# Patient Record
Sex: Male | Born: 1935 | Race: Black or African American | Hispanic: No | Smoking: Never smoker
Health system: Southern US, Community
[De-identification: ages and names within clinical notes are randomized; demographics above are authoritative.]

## PROBLEM LIST (undated history)

## (undated) DIAGNOSIS — Z8719 Personal history of other diseases of the digestive system: Secondary | ICD-10-CM

## (undated) DIAGNOSIS — K635 Polyp of colon: Secondary | ICD-10-CM

## (undated) DIAGNOSIS — I2510 Atherosclerotic heart disease of native coronary artery without angina pectoris: Secondary | ICD-10-CM

## (undated) DIAGNOSIS — D649 Anemia, unspecified: Secondary | ICD-10-CM

## (undated) DIAGNOSIS — R319 Hematuria, unspecified: Secondary | ICD-10-CM

## (undated) DIAGNOSIS — I10 Essential (primary) hypertension: Secondary | ICD-10-CM

## (undated) HISTORY — PX: JOINT REPLACEMENT: SHX530

## (undated) HISTORY — PX: COLONOSCOPY WITH ESOPHAGOGASTRODUODENOSCOPY (EGD): SHX5779

---

## 2007-05-01 ENCOUNTER — Ambulatory Visit: Payer: Self-pay | Admitting: Unknown Physician Specialty

## 2012-09-20 ENCOUNTER — Emergency Department: Payer: Self-pay | Admitting: Emergency Medicine

## 2012-11-27 ENCOUNTER — Ambulatory Visit: Payer: Self-pay | Admitting: General Practice

## 2012-12-06 ENCOUNTER — Ambulatory Visit: Payer: Self-pay | Admitting: General Practice

## 2012-12-06 LAB — URINALYSIS, COMPLETE
Blood: NEGATIVE
Ketone: NEGATIVE
Leukocyte Esterase: NEGATIVE
Ph: 6 (ref 4.5–8.0)
Protein: NEGATIVE
RBC,UR: 2 /HPF (ref 0–5)
Specific Gravity: 1.013 (ref 1.003–1.030)

## 2012-12-06 LAB — BASIC METABOLIC PANEL
BUN: 34 mg/dL — ABNORMAL HIGH (ref 7–18)
Calcium, Total: 10 mg/dL (ref 8.5–10.1)
Chloride: 104 mmol/L (ref 98–107)
Creatinine: 1.19 mg/dL (ref 0.60–1.30)
EGFR (Non-African Amer.): 59 — ABNORMAL LOW
Glucose: 101 mg/dL — ABNORMAL HIGH (ref 65–99)
Sodium: 139 mmol/L (ref 136–145)

## 2012-12-06 LAB — CBC
HCT: 40 % (ref 40.0–52.0)
HGB: 13.4 g/dL (ref 13.0–18.0)
MCV: 84 fL (ref 80–100)
Platelet: 302 10*3/uL (ref 150–440)
WBC: 6.5 10*3/uL (ref 3.8–10.6)

## 2012-12-06 LAB — MRSA PCR SCREENING

## 2012-12-06 LAB — SEDIMENTATION RATE: Erythrocyte Sed Rate: 28 mm/hr — ABNORMAL HIGH (ref 0–20)

## 2012-12-06 LAB — PROTIME-INR: INR: 1

## 2012-12-06 LAB — APTT: Activated PTT: 30.9 secs (ref 23.6–35.9)

## 2012-12-07 LAB — URINE CULTURE

## 2012-12-18 ENCOUNTER — Ambulatory Visit: Payer: Self-pay

## 2012-12-20 ENCOUNTER — Inpatient Hospital Stay: Payer: Self-pay | Admitting: General Practice

## 2012-12-21 LAB — BASIC METABOLIC PANEL
Anion Gap: 5 — ABNORMAL LOW (ref 7–16)
Calcium, Total: 8.3 mg/dL — ABNORMAL LOW (ref 8.5–10.1)
Co2: 27 mmol/L (ref 21–32)
EGFR (African American): >60
EGFR (Non-African Amer.): >60
Glucose: 120 mg/dL — ABNORMAL HIGH (ref 65–99)
Osmolality: 279 (ref 275–301)
Potassium: 3.1 mmol/L — ABNORMAL LOW (ref 3.5–5.1)
Sodium: 140 mmol/L (ref 136–145)

## 2012-12-21 LAB — PLATELET COUNT: Platelet: 211 10*3/uL (ref 150–440)

## 2012-12-21 LAB — HEMOGLOBIN: HGB: 9.7 g/dL — ABNORMAL LOW (ref 13.0–18.0)

## 2012-12-22 LAB — BASIC METABOLIC PANEL
Anion Gap: 7 (ref 7–16)
Co2: 25 mmol/L (ref 21–32)
EGFR (African American): >60
EGFR (Non-African Amer.): >60
Glucose: 117 mg/dL — ABNORMAL HIGH (ref 65–99)
Potassium: 3 mmol/L — ABNORMAL LOW (ref 3.5–5.1)

## 2012-12-23 LAB — BASIC METABOLIC PANEL
Anion Gap: 4 — ABNORMAL LOW (ref 7–16)
BUN: 7 mg/dL (ref 7–18)
Calcium, Total: 8.4 mg/dL — ABNORMAL LOW (ref 8.5–10.1)
Co2: 29 mmol/L (ref 21–32)
EGFR (African American): >60
EGFR (Non-African Amer.): >60
Osmolality: 278 (ref 275–301)
Potassium: 3.6 mmol/L (ref 3.5–5.1)
Sodium: 140 mmol/L (ref 136–145)

## 2012-12-23 LAB — PLATELET COUNT: Platelet: 174 10*3/uL (ref 150–440)

## 2012-12-23 LAB — HEMOGLOBIN: HGB: 7.5 g/dL — ABNORMAL LOW (ref 13.0–18.0)

## 2014-06-21 NOTE — Discharge Summary (Signed)
PATIENT NAME:  Kevin [name], Kevin [name] MR#:  [id number] DATE OF BIRTH:  [birth year]  DATE OF ADMISSION:  12/20/2012 DATE OF DISCHARGE:  12/23/2012   ADMITTING DIAGNOSIS: Degenerative arthrosis of the right hip.   DISCHARGE DIAGNOSIS: Degenerative arthrosis of the right hip.    OPERATION: On 12/20/2012, the patient had a right total hip arthroplasty.   SURGEON: Illene LabradorJames P. Angie FavaHooten Jr., MD  ASSISTANT: Van ClinesJon Wolfe, PA   ANESTHESIA: Spinal and general.   ESTIMATED BLOOD LOSS: 300 mL.   DRAINS: Two medium drains to Hemovac reservoir.   IMPLANTS USED: A DePuy 18 mm small stature AML femoral stem, a 66 mm outer diameter Pinnacle Gription Sector acetabular component, +4 mm 10 degree Pinnacle Marathon polyethylene liner, a 36 mm M-Spec femoral head with +5 mm neck length.   The patient was stabilized, brought to the recovery room and then brought down to the orthopedic floor.   HISTORY: The patient is a 81 year old male who presented for evaluation of right hip. The patient has had significant progressive pain involving the right hip with being refractory to conservative treatment.   PHYSICAL EXAMINATION:  GENERAL: An alert male in no acute distress, with an antalgic gait and an abductor lurch.  LUNGS: Clear to auscultation.  CARDIOVASCULAR: Regular rate and rhythm. No murmur.  MUSCULOSKELETAL: In regard to the right hip, the patient has no effusion or swelling. The patient does have tenderness along the lateral aspect of the hip and the anterior hip joint. The patient has discomfort with axial compression test. The patient has full extension and 80 degrees of flexion with 10 degrees adduction and abduction. The patient has 0 degrees internal rotation and 30 degrees external rotation. The patient was stabilized after surgery and brought to the recovery room.   HOSPITAL COURSE: After initial admission on 12/20/2012, the patient was brought to the orthopedic floor. The patient had a hemoglobin of 9.7 on  postoperative day 1, which dropped down to 8.0 on postoperative day 2. Labs were still pending on the day of discharge. The patient did 80 feet with physical therapy on postoperative day 1, progressing to 200 feet and did very well. The patient is ready to go home with home health physical therapy.   DISCHARGE INSTRUCTIONS:  1. The patient [facility], with Dr. Ernest PineHooten. 2. The patient will do weight-bear as tolerated on the affected leg with a walker.  3. The patient will elevate the affected leg with 1 to 2 pillows, keeping the foot higher and do heels off the bed.  4. The patient will use knee-high TED hose on both legs, removed at bedtime.  5. The patient is encouraged to do cough and deep breathing.  6. The patient will resume regular diet.  7. The patient's dressing will be left intact. Try to keep it dry.  8. The patient will have staples removed by home health physical therapy in 2 weeks.  9. The patient will call the clinic if there is any bright red bleeding, calf pain, bowel or bladder difficulty or fever greater than 101.5.   DISCHARGE MEDICATIONS: To resume home medication and then to add Tylenol 1 tablet every 4 hours as needed for pain or fever greater than 100.4, oxycodone 5 mg 1 tablet q.4 hours as needed for severe pain, tramadol 50 mg 1 tablet q.8 hours as needed for mild pain, Lovenox 40 mg subcutaneous for 14 days, then discontinue and begin aspirin 81 mg once a day.  ____________________________ Shela Commons. Dedra Skeens, [place] jtm:lb D: 12/23/2012 05:59:15 ET T: 12/23/2012 06:16:54 ET JOB#: [id number]  cc: J. Dedra Skeens, [place], <Dictator> J Ellinore Merced [facility] PA ELECTRONICALLY SIGNED 01/04/2013 7:14

## 2014-06-21 NOTE — Op Note (Signed)
PATIENT NAME:  Kevin [name], Kevin [name] MR#:  [id number] DATE OF BIRTH:  [birth year]  DATE OF PROCEDURE:  12/20/2012  PREOPERATIVE DIAGNOSIS: Degenerative arthrosis of the right hip.   POSTOPERATIVE DIAGNOSIS: Degenerative arthrosis of the right hip.   PROCEDURE PERFORMED: Right total hip arthroplasty.   SURGEON: Dr. Francesco SorJames Hooten.   ASSISTANT: Van ClinesJon Wolfe, PA (required to maintain retraction throughout the procedure).   ANESTHESIA: Spinal and general.   ESTIMATED BLOOD LOSS: 300 mL.   FLUIDS REPLACED: 2000 mL of crystalloid.   DRAINS: Two medium drains to Hemovac reservoir.   IMPLANTS UTILIZED: DePuy 18 mm small stature AML femoral stem, a 66 mm outer diameter Pinnacle Gription Sector acetabular component, +4 mm 10 degree Pinnacle Marathon polyethylene liner, and a 36 mm M-Spec femoral head with a +5 mm neck length.   INDICATIONS FOR SURGERY: The patient is a 81 year old male who has been seen for complaints of severe progressive right hip and groin pain as well as decreased range of motion of the hip. 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 After discussion of the risks and benefits of surgical intervention, the patient expressed understanding of the risks, benefits, and agreed with plans for surgical intervention.   PROCEDURE IN DETAIL: The patient was brought to the operating room, and after adequate spinal and general anesthesia was achieved, the patient was placed in a left lateral decubitus position. Axillary roll was placed, and all bony prominences were well padded. The patient's right hip and leg were cleaned and prepped with alcohol and DuraPrep draped in the usual sterile fashion. A "timeout" was performed as per usual protocol. A lateral curvilinear incision was made gently curving towards the posterior superior iliac spine. IT band was incised in  line with the skin incision, and fibers of the gluteus maximus were split in line. The piriformis tendon was identified, skeletonized, and incised at its insertion on the proximal femur and reflected posteriorly. In a similar fashion, short external rotators were incised and reflected posteriorly. A T-type posterior capsulotomy was performed. Measurements were obtained of limb length with the knees flexed to 90 degrees and then the hip was dislocated posteriorly. Severe degenerative changes were noted with full thickness loss of articular cartilage and gross deformity of the femoral head. Prominent osteophytes were noted about the base of the head as well as along the area proximal to the lesser trochanter. Debridement of the osteophytes was performed in sufficient fashion such as to better delineate the true femoral neck. The femoral neck cut was performed using an oscillating saw. The capsule was elevated off of the anterior aspect of the femoral neck using a periosteal elevator. Inspection of the acetabulum also demonstrated full-thickness loss of articular cartilage with prominent osteophytes anteriorly and inferiorly. The acetabulum was reamed in a sequential fashion up to a 65 mm diameter. This allowed for good punctate bleeding bone. A 66 mm outer diameter Pinnacle Gription Sector acetabular component was positioned and impacted into place. Excellent scratch fit was appreciated. A +4 mm polyethylene trial was inserted. The anterior and inferior osteophytes were then debrided using a combination of osteotome and rongeur. Attention was then directed to the proximal femur. Pilot hole for reaming of the proximal femoral canal was created using a high-speed bur. Proximal femoral canal was reamed in a sequential fashion up to a 17.5 mm diameter. This allowed for approximately 4.5 cm of scratch fit.  The proximal femur was then further prepared using an 18 mm aggressive side-biting reamer. Serial broaches were  inserted up to 18 mm small stature broach. The calcar region was planed and trial reduction was performed using first +1.5 and subsequently +5 mm neck length. Reasonably good stability was appreciated, but it was elected to also trial with a +4 mm 10 degree liner with the high side at approximately the eight o'clock position. This allowed for improved posterior stability. Trial components were removed. The acetabular shell was irrigated was normal saline with antibiotic solution and then suctioned dry. A +4 mm 10 degree Pinnacle Marathon polyethylene liner was positioned with the high side at eight o'clock position and impacted into place. Next, an 18 mm small stature AML femoral component was initially positioned. Given the contact area it was elected to carefully ream line to line with an 18 mm reamer by hand. The 18 mm small stature AML femoral component was repositioned and then impacted into place. Excellent scratch fit was appreciated. Trial reduction was again performed with a 36 mm hip ball with a +5 mm neck length. Again, excellent stability was noted both anteriorly and posteriorly. Limb lengths were measured and felt to be consistent with preoperative planning for improvement of equalization of limb lengths. Trial hip ball was removed.The Morse taper  was cleaned and dried. A 36 mm outer diameter M-Spec femoral head with a +5 mm neck length was placed on the trunnion and impacted into place. The hip was reduced and placed through range of motion. Again, excellent stability was noted both anteriorly and posteriorly and good equalization of limb lengths was appreciated. The wound was irrigated with copious amounts of normal saline with antibiotic solution using pulsatile lavage and then suctioned dry. The posterior capsulotomy was repaired using #5 Ethibond. The piriformis tendon was reapproximated on the undersurface of the gluteus medius tendon using #5 Ethibond. Two medium drains were placed in the wound  bed and brought out through a separate stab incision to be attached to a Hemovac reservoir. IT band was repaired using interrupted sutures of #1 Vicryl. The subcutaneous tissue was approximated in layers using first #0 Vicryl followed by 2-0 Vicryl. Skin was closed with skin staples. Sterile dressing was applied.   The patient tolerated the procedure well. He was transported to the recovery room in stable condition.    ____________________________ Illene Labrador. Angie Fava., MD jph:sg D: 12/21/2012 06:16:19 ET T: 12/21/2012 06:47:43 ET JOB#: [id number]  cc: Illene Labrador. Angie Fava., MD, <Dictator> Illene Labrador Angie Fava MD ELECTRONICALLY SIGNED 12/22/2012 19:19

## 2014-06-21 NOTE — Op Note (Signed)
PATIENT NAME:  Kevin [name], [name] MR#:  [id number] DATE OF BIRTH:  [birth year]  INTRAOPERATIVE CONSULTATION   DATE OF PROCEDURE:  12/20/2012  REQUESTING PHYSICIAN: Illene LabradorJames P. Angie FavaHooten Jr., MD   CONSULTING PHYSICIAN: Verna CzechScott C. Braedon Sjogren, MD   REASON FOR CONSULTATION: Inability to place Foley catheter.   HISTORY OF PRESENT ILLNESS: A 81 year old male scheduled for a hip replacement. After induction of anesthesia, operating room personnel were unable to place a Foley catheter. There is no previous history of urological or urethral surgery.   DESCRIPTION: The external genitalia were prepped and draped. A Uro-Jet lidocaine gel was instilled per urethra. An 2018 French coude catheter was placed without difficulty and placed to gravity drainage.   IMPRESSION: Benign prostatic hypertrophy.   RECOMMENDATION: Discontinue Foley catheter postoperatively per routine. Reconsult for any voiding difficulties.    ____________________________ Verna CzechScott C. Lonna CobbStoioff, MD scs:lb D: 12/21/2012 08:12:27 ET T: 12/21/2012 08:53:53 ET JOB#: [id number]  cc: Lorin PicketScott C. Lonna CobbStoioff, MD, <Dictator> Riki AltesSCOTT C Natash Berman MD ELECTRONICALLY SIGNED 01/03/2013 8:42

## 2014-09-26 ENCOUNTER — Encounter: Payer: Self-pay | Admitting: *Deleted

## 2014-09-27 ENCOUNTER — Encounter
Admission: RE | Disposition: A | Discharge status: Home / Self Care | Payer: Self-pay | Source: Ambulatory Visit | Attending: Unknown Physician Specialty

## 2014-09-27 ENCOUNTER — Ambulatory Visit: Payer: Medicare Other | Admitting: Anesthesiology

## 2014-09-27 ENCOUNTER — Ambulatory Visit
Admission: RE | Admit: 2014-09-27 | Discharge: 2014-09-27 | Disposition: A | Discharge status: Home / Self Care | Payer: Medicare Other | Source: Ambulatory Visit | Attending: Unknown Physician Specialty | Admitting: Unknown Physician Specialty

## 2014-09-27 DIAGNOSIS — I10 Essential (primary) hypertension: Secondary | ICD-10-CM | POA: Insufficient documentation

## 2014-09-27 DIAGNOSIS — K640 First degree hemorrhoids: Secondary | ICD-10-CM | POA: Diagnosis not present

## 2014-09-27 DIAGNOSIS — Z79899 Other long term (current) drug therapy: Secondary | ICD-10-CM | POA: Diagnosis not present

## 2014-09-27 DIAGNOSIS — I2510 Atherosclerotic heart disease of native coronary artery without angina pectoris: Secondary | ICD-10-CM | POA: Insufficient documentation

## 2014-09-27 DIAGNOSIS — Z9660 Presence of unspecified orthopedic joint implant: Secondary | ICD-10-CM | POA: Diagnosis not present

## 2014-09-27 DIAGNOSIS — K449 Diaphragmatic hernia without obstruction or gangrene: Secondary | ICD-10-CM | POA: Insufficient documentation

## 2014-09-27 DIAGNOSIS — Z09 Encounter for follow-up examination after completed treatment for conditions other than malignant neoplasm: Secondary | ICD-10-CM | POA: Diagnosis not present

## 2014-09-27 DIAGNOSIS — Z7982 Long term (current) use of aspirin: Secondary | ICD-10-CM | POA: Diagnosis not present

## 2014-09-27 DIAGNOSIS — N529 Male erectile dysfunction, unspecified: Secondary | ICD-10-CM | POA: Insufficient documentation

## 2014-09-27 DIAGNOSIS — Z86010 Personal history of colonic polyps: Secondary | ICD-10-CM | POA: Diagnosis present

## 2014-09-27 HISTORY — DX: Essential (primary) hypertension: I10

## 2014-09-27 HISTORY — PX: COLONOSCOPY WITH PROPOFOL: SHX5780

## 2014-09-27 HISTORY — DX: Polyp of colon: K63.5

## 2014-09-27 HISTORY — DX: Personal history of other diseases of the digestive system: Z87.19

## 2014-09-27 HISTORY — DX: Anemia, unspecified: D64.9

## 2014-09-27 HISTORY — DX: Hematuria, unspecified: R31.9

## 2014-09-27 HISTORY — DX: Atherosclerotic heart disease of native coronary artery without angina pectoris: I25.10

## 2014-09-27 SURGERY — COLONOSCOPY WITH PROPOFOL
Anesthesia: General

## 2014-09-27 MED ORDER — PROPOFOL INFUSION 10 MG/ML OPTIME: INTRAVENOUS | Status: DC | PRN

## 2014-09-27 MED ORDER — PROPOFOL INFUSION 10 MG/ML OPTIME
INTRAVENOUS | Status: DC | PRN
  Administered 2014-09-27: 140 ug/kg/min via INTRAVENOUS

## 2014-09-27 MED ORDER — FENTANYL CITRATE (PF) 100 MCG/2ML IJ SOLN
INTRAMUSCULAR | Status: DC | PRN
  Administered 2014-09-27: 50 ug via INTRAVENOUS

## 2014-09-27 MED ORDER — SODIUM CHLORIDE 0.9 % IV SOLN
INTRAVENOUS | Status: DC
  Administered 2014-09-27: 11:00:00 via INTRAVENOUS
  Administered 2014-09-27: 1000 mL via INTRAVENOUS

## 2014-09-27 MED ORDER — PROPOFOL 10 MG/ML IV BOLUS
INTRAVENOUS | Status: DC | PRN
  Administered 2014-09-27: 50 mg via INTRAVENOUS

## 2014-09-27 MED ORDER — MIDAZOLAM HCL 5 MG/5ML IJ SOLN
INTRAMUSCULAR | Status: DC | PRN
  Administered 2014-09-27: 1 mg via INTRAVENOUS

## 2014-09-27 MED ORDER — PIPERACILLIN-TAZOBACTAM 3.375 G IVPB 30 MIN
3.3750 g | Freq: Once | INTRAVENOUS | Status: AC
  Administered 2014-09-27: 3.375 g via INTRAVENOUS
  Filled 2014-09-27: qty 50

## 2014-09-27 NOTE — H&P (Signed)
Primary Care Physician:  Kevin Braun, MD Primary Gastroenterologist:  Dr. Mechele [name]  Pre-Procedure History & Physical: HPI:  Kevin [name] is a 81 y.o. [name] is here for an colonoscopy.   Past Medical History  Diagnosis Date  . Hypertension   . History of hiatal hernia   . Anemia   . Colon [name]   . Coronary artery disease   . Hematuria     Past Surgical History  Procedure Laterality Date  . Joint replacement    . Colonoscopy with esophagogastroduodenoscopy (egd)      Prior to Admission medications   Medication Sig Start Date End Date Taking? Authorizing Provider  amLODipine (NORVASC) 10 MG tablet Take 10 mg by mouth daily.   Yes Historical Provider, MD  aspirin 81 MG tablet Take 81 mg by mouth daily.   Yes Historical Provider, MD  sildenafil (VIAGRA) 100 MG tablet Take 100 mg by mouth daily as needed for erectile dysfunction.   Yes Historical Provider, MD  triamterene-hydrochlorothiazide (MAXZIDE-25) 37.5-25 MG per tablet Take 1 tablet by mouth daily.   Yes Historical Provider, MD    Allergies as of 08/28/2014  . (Not on File)    History reviewed. No pertinent family history.  History   Social History  . Marital Status: Married    Spouse Name: N/A  . Number of Children: N/A  . Years of Education: N/A   Occupational History  . Not on file.   Social History Main Topics  . Smoking status: Never Smoker   . Smokeless tobacco: Not on file  . Alcohol Use: No  . Drug Use: No  . Sexual Activity: Not on file   Other Topics Concern  . Not on file   Social History Narrative    Review of Systems: See HPI, otherwise negative ROS  Physical Exam: BP 154/105 mmHg  Pulse 79  Temp(Src) 97.2 F (36.2 C) (Tympanic)  Resp 18  Ht 6\' 2"  (1.88 m)  Wt 86.183 kg (190 lb)  BMI 24.38 kg/m2  SpO2 100% General:   Alert,  pleasant and cooperative in NAD Head:  Normocephalic and atraumatic. Neck:  Supple; no masses or thyromegaly. Lungs:  Clear throughout to  auscultation.    Heart:  Regular rate and rhythm. Abdomen:  Soft, nontender and nondistended. Normal bowel sounds, without guarding, and without rebound.   Neurologic:  Alert and  oriented x4;  grossly normal neurologically.  Impression/Plan: Kevin [name] is here for an colonoscopy to be performed for Kevin [name]   Risks, benefits, limitations, and alternatives regarding  colonoscopy have been reviewed with the patient.  Questions have been answered.  All parties agreeable.   Kevin Prude, MD  09/27/2014, 10:53 AM   Primary Care Physician:  Kevin Braun, MD Primary Gastroenterologist:  Dr. Mechele [name]  Pre-Procedure History & Physical: HPI:  Kevin [name] is a 81 y.o. [name] is here for an colonoscopy.   Past Medical History  Diagnosis Date  . Hypertension   . History of hiatal hernia   . Anemia   . Colon [name]   . Coronary artery disease   . Hematuria     Past Surgical History  Procedure Laterality Date  . Joint replacement    . Colonoscopy with esophagogastroduodenoscopy (egd)      Prior to Admission medications   Medication Sig Start Date End Date Taking? Authorizing Provider  amLODipine (NORVASC) 10 MG tablet Take 10 mg by mouth daily.   Yes Historical Provider, MD  aspirin  81 MG tablet Take 81 mg by mouth daily.   Yes Historical Provider, MD  sildenafil (VIAGRA) 100 MG tablet Take 100 mg by mouth daily as needed for erectile dysfunction.   Yes Historical Provider, MD  triamterene-hydrochlorothiazide (MAXZIDE-25) 37.5-25 MG per tablet Take 1 tablet by mouth daily.   Yes Historical Provider, MD    Allergies as of 08/28/2014  . (Not on File)    History reviewed. No pertinent family history.  History   Social History  . Marital Status: Married    Spouse Name: N/A  . Number of Children: N/A  . Years of Education: N/A   Occupational History  . Not on file.   Social History Main Topics  . Smoking status: Never Smoker   . Smokeless tobacco: Not on  file  . Alcohol Use: No  . Drug Use: No  . Sexual Activity: Not on file   Other Topics Concern  . Not on file   Social History Narrative    Review of Systems: See HPI, otherwise negative ROS  Physical Exam: BP 154/105 mmHg  Pulse 79  Temp(Src) 97.2 F (36.2 C) (Tympanic)  Resp 18  Ht  (1.88 m)  Wt 86.183 kg (190 lb)  BMI 24.38 kg/m2  SpO2 100% General:   Alert,  pleasant and cooperative in NAD Head:  Normocephalic and atraumatic. Neck:  Supple; no masses or thyromegaly. Lungs:  Clear throughout to auscultation.    Heart:  Regular rate and rhythm. Abdomen:  Soft, nontender and nondistended. Normal bowel sounds, without guarding, and without rebound.   Neurologic:  Alert and  oriented x4;  grossly normal neurologically.  Impression/Plan: Kevin [name] is here for an colonoscopy to be performed for personal hx colon [name]  Risks, benefits, limitations, and alternatives regarding  colonoscopy have been reviewed with the patient.  Questions have been answered.  All parties agreeable.   Kevin Prude, MD  09/27/2014, 10:53 AM

## 2014-09-27 NOTE — Anesthesia Preprocedure Evaluation (Addendum)
Anesthesia Evaluation  Patient identified by MRN, date of birth, ID band Patient awake    Reviewed: Allergy & Precautions, H&P , NPO status , Patient's Chart, lab work & pertinent test results, reviewed documented beta blocker date and time   Airway Mallampati: III  TM Distance: >3 FB Neck ROM: limited    Dental  (+) Upper Dentures, Lower Dentures, Poor Dentition, Chipped, Missing   Pulmonary neg pulmonary ROS,  breath sounds clear to auscultation  Pulmonary exam normal       Cardiovascular hypertension, + CAD negative cardio ROS Normal cardiovascular examRhythm:regular Rate:Normal     Neuro/Psych negative neurological ROS  negative psych ROS   GI/Hepatic negative GI ROS, Neg liver ROS, hiatal hernia,   Endo/Other  negative endocrine ROS  Renal/GU negative Renal ROS  negative genitourinary   Musculoskeletal   Abdominal   Peds  Hematology negative hematology ROS (+)   Anesthesia Other Findings Past Medical History:   Hypertension                                                 History of hiatal hernia                                     Anemia                                                       Colon polyps                                                 Coronary artery disease                                      Hematuria                                            Reproductive/Obstetrics negative OB ROS                           Anesthesia Physical Anesthesia Plan  ASA: III  Anesthesia Plan: General   Post-op Pain Management:    Induction:   Airway Management Planned:   Additional Equipment:   Intra-op Plan:   Post-operative Plan:   Informed Consent: I have reviewed the patients History and Physical, chart, labs and discussed the procedure including the risks, benefits and alternatives for the proposed anesthesia with the patient or authorized representative who has  indicated his/her understanding and acceptance.   Dental Advisory Given  Plan Discussed with: Anesthesiologist, CRNA and Surgeon  Anesthesia Plan Comments:         Anesthesia Quick Evaluation

## 2014-09-27 NOTE — Anesthesia Postprocedure Evaluation (Signed)
  Anesthesia Post-op Note  Patient: Kevin [name]  Procedure(s) Performed: Procedure(s): COLONOSCOPY WITH PROPOFOL (N/A)  Anesthesia type:General  Patient location: PACU  Post pain: Pain level controlled  Post assessment: Post-op Vital signs reviewed, Patient's Cardiovascular Status Stable, Respiratory Function Stable, Patent Airway and No signs of Nausea or vomiting  Post vital signs: Reviewed and stable  Last Vitals:  Filed Vitals:   09/27/14 1200  BP: 130/64  Pulse: 60  Temp:   Resp: 24    Level of consciousness: awake, alert  and patient cooperative  Complications: No apparent anesthesia complications

## 2014-09-27 NOTE — Op Note (Signed)
[facility] Patient Name: Kevin [name] Procedure Date: 09/27/2014 10:52 AM MRN: [id number] Account #: [id number] Date of Birth: November 07, 1933 Admit Type: Outpatient Age: 81 Room: [facility] ENDO ROOM 1 Gender: Male Note Status: Finalized Procedure:         Colonoscopy Indications:       Personal history of colonic polyps Providers:         Scot Jun, MD Referring MD:      Teena Irani. Sampson Goon, MD (Referring MD) Medicines:         Propofol per Anesthesia Complications:     No immediate complications. Procedure:         Pre-Anesthesia Assessment:                    - After reviewing the risks and benefits, the patient was                     deemed in satisfactory condition to undergo the procedure.                    After obtaining informed consent, the colonoscope was                     passed under direct vision. Throughout the procedure, the                     patient's blood pressure, pulse, and oxygen saturations                     were monitored continuously. The Olympus PCF-160AL                     colonoscope (S#. [id number]) was introduced through the anus                     and advanced to the the cecum, identified by appendiceal                     orifice and ileocecal valve. The colonoscopy was performed                     without difficulty. The patient tolerated the procedure                     well. The quality of the bowel preparation was good. Findings:      Internal hemorrhoids were found during endoscopy. The hemorrhoids were       small and Grade I (internal hemorrhoids that do not prolapse).      The exam was otherwise without abnormality. The colon was otherwise       normal, but long and redundant. Impression:        - Internal hemorrhoids.                    - The examination was otherwise normal.                    - No specimens collected. Recommendation:    - The findings and recommendations were discussed with the                     patient's family. No routine repeat needed. Scot Jun, MD 09/27/2014 11:29:15 AM This report has been signed electronically. Number of Addenda: 0 Note Initiated On: 09/27/2014 10:52 AM  Scope Withdrawal Time: 0 hours 10 minutes 32 seconds  Total Procedure Duration: 0 hours 26 minutes 35 seconds       [facility]

## 2014-09-27 NOTE — Transfer of Care (Signed)
Immediate Anesthesia Transfer of Care Note  Patient: Kevin [name]  Procedure(s) Performed: Procedure(s): COLONOSCOPY WITH PROPOFOL (N/A)  Patient Location: PACU  Anesthesia Type:General  Level of Consciousness: awake and alert   Airway & Oxygen Therapy: Patient Spontanous Breathing and Patient connected to nasal cannula oxygen  Post-op Assessment: Report given to RN  Post vital signs: stable  Last Vitals:  Filed Vitals:   09/27/14 1131  BP: 103/56  Pulse: 85  Temp: 36.4 C  Resp: 20    Complications: No apparent anesthesia complications

## 2014-09-30 ENCOUNTER — Encounter: Payer: Self-pay | Admitting: Unknown Physician Specialty

## 2015-06-15 IMAGING — CR RIGHT HIP - COMPLETE 2+ VIEW
1 series · 2 of 2 positions shown · non-contrast
Comparison: none

REASON FOR EXAM: s/p THA
COMMENTS:   Bedside (portable):Y

[Series 1: ap · 0.17mm/px · 2 of 2 slices shown]
[im 1/2]
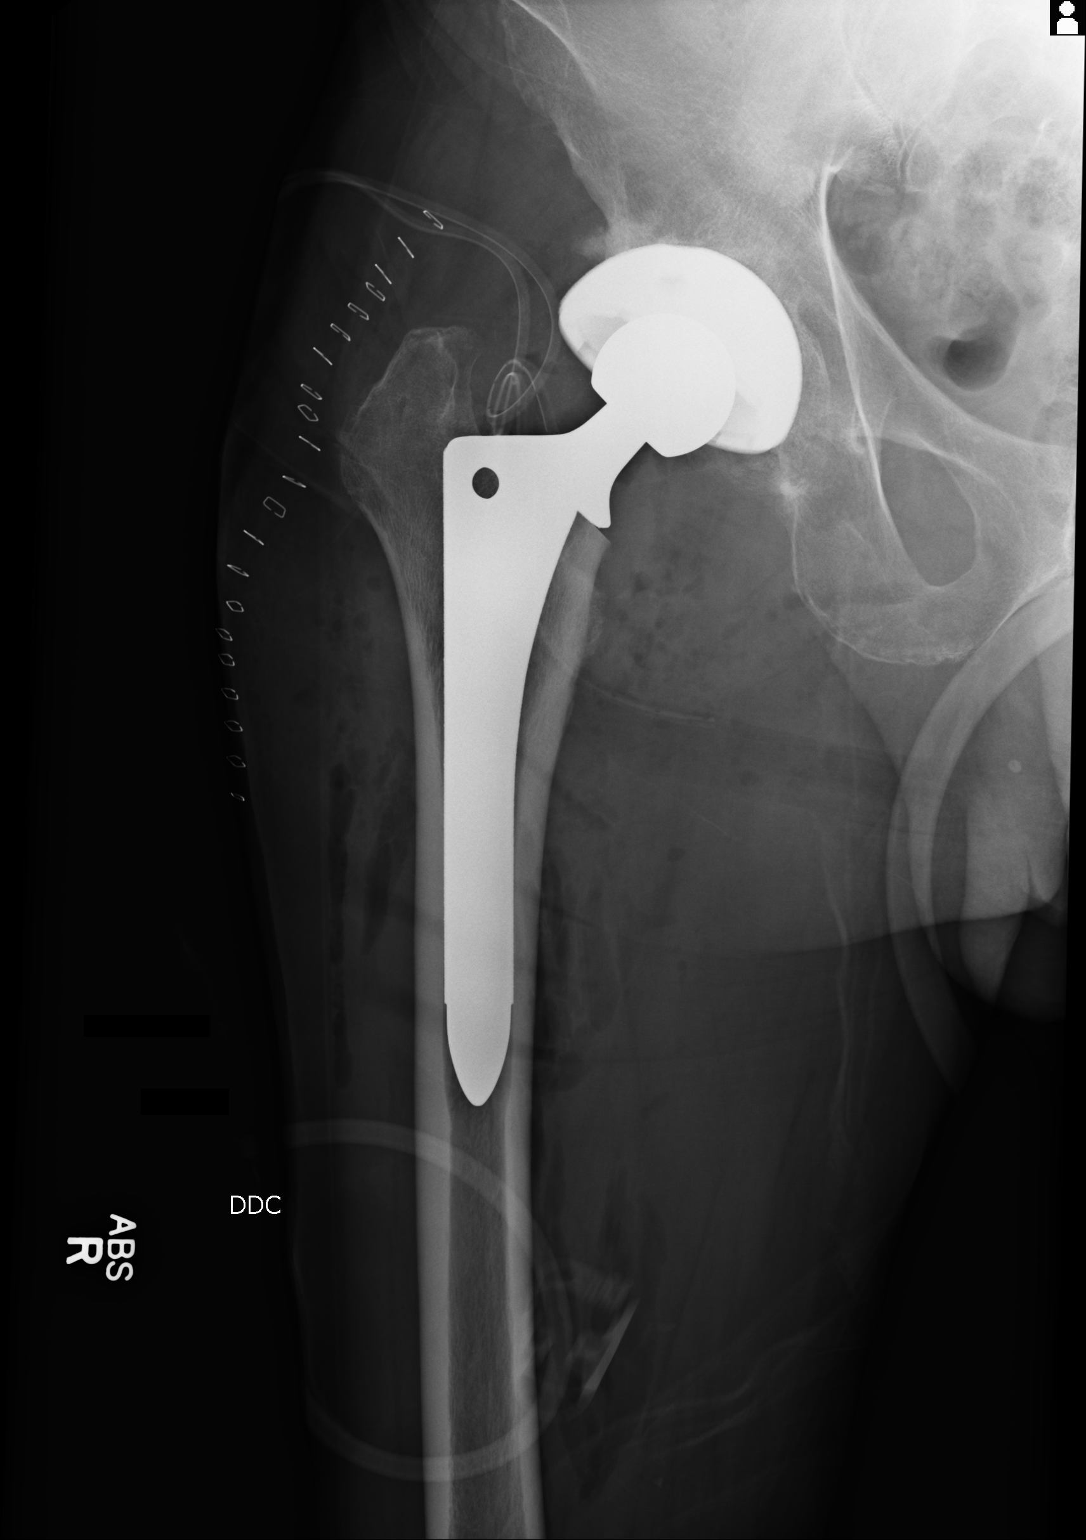
[im 2/2]
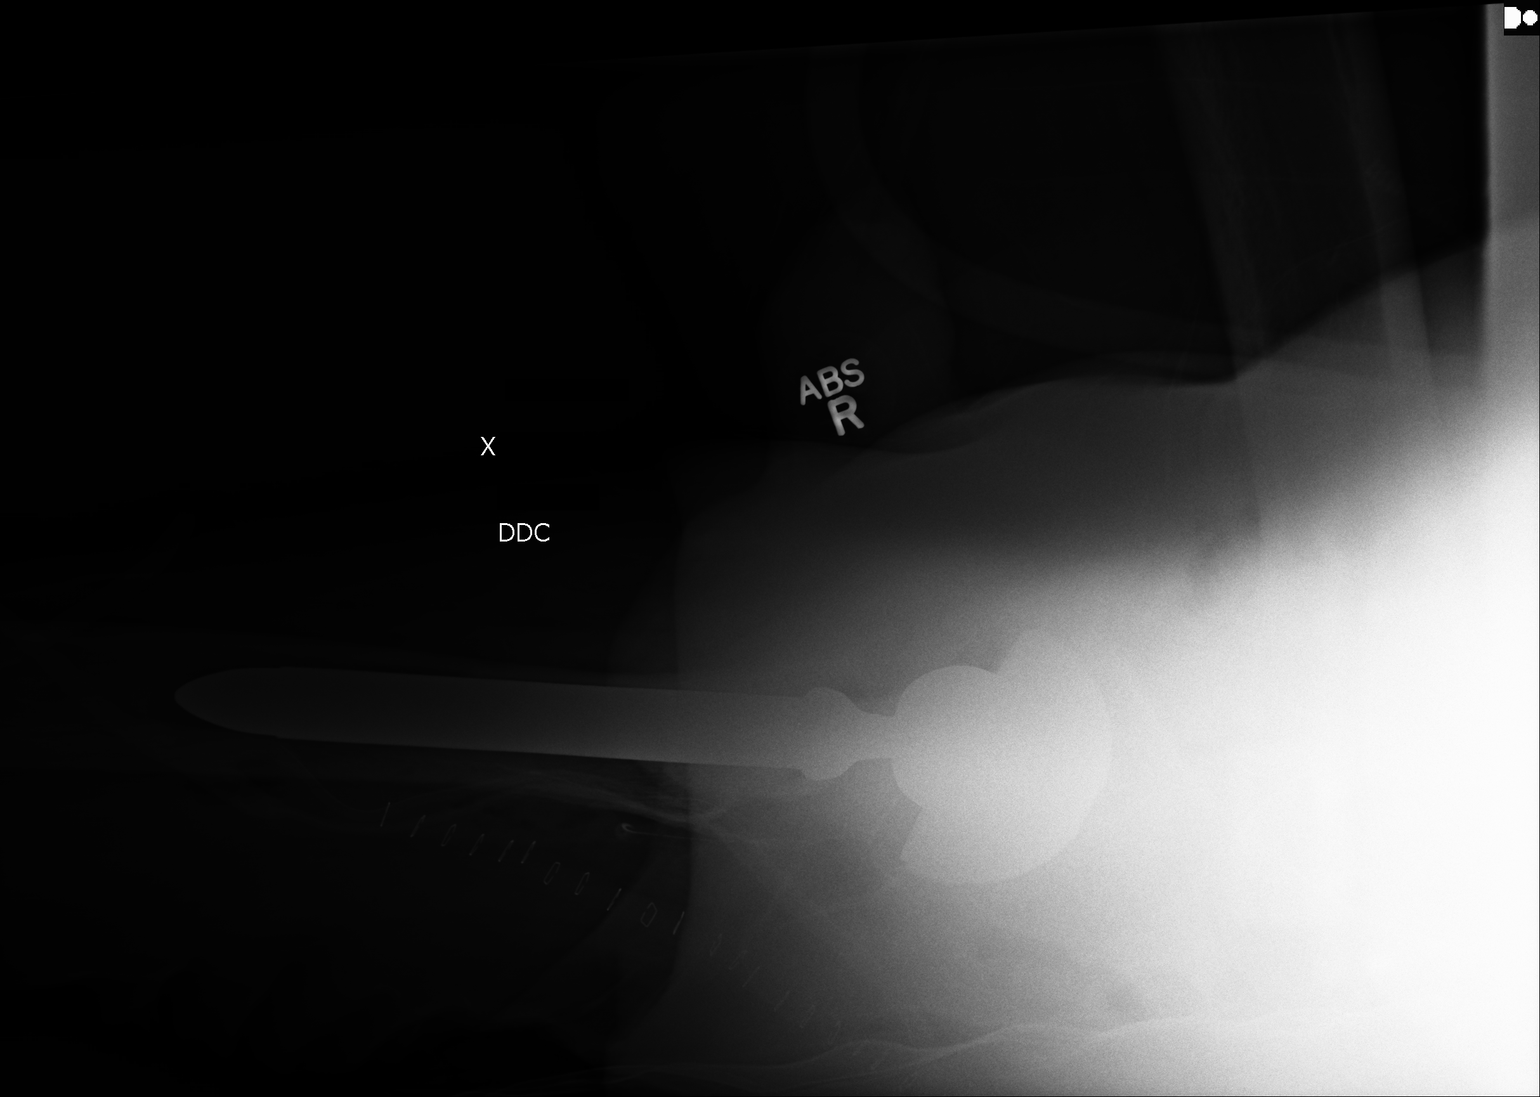

[2 of 2 positions shown; findings below may reference images not displayed]

PROCEDURE:     DXR - DXR HIP RIGHT COMPLETE  - December 20, 2012 [DATE]

RESULT:     AP and crosstable lateral views of the right hip reveal the
patient to have undergone total joint prosthesis placement. Radiographic
positioning of the prosthetic components is good. Surgical drain lines and
skin staples are present.
IMPRESSION: The patient has undergone right hip joint prosthesis
placement. Further interpretation is deferred to Dr. [employer].

[REDACTED]

## 2015-11-27 ENCOUNTER — Telehealth: Payer: Self-pay | Admitting: *Deleted

## 2015-11-27 NOTE — Telephone Encounter (Signed)
Received documentation at this clinic of patient's influenza immunization, attention Dr. Sampson GoonFitzgerald.  Dr. Sampson GoonFitzgerald no longer at this clinic, immunization record updated. Will 'cc PCP. Andree CossHowell, Michelle M, RN

## 2021-07-22 DIAGNOSIS — I2510 Atherosclerotic heart disease of native coronary artery without angina pectoris: Secondary | ICD-10-CM | POA: Diagnosis not present

## 2021-07-22 DIAGNOSIS — I10 Essential (primary) hypertension: Secondary | ICD-10-CM | POA: Diagnosis not present

## 2021-07-29 DIAGNOSIS — I10 Essential (primary) hypertension: Secondary | ICD-10-CM | POA: Diagnosis not present

## 2021-07-29 DIAGNOSIS — Z1389 Encounter for screening for other disorder: Secondary | ICD-10-CM | POA: Diagnosis not present

## 2021-07-29 DIAGNOSIS — I2510 Atherosclerotic heart disease of native coronary artery without angina pectoris: Secondary | ICD-10-CM | POA: Diagnosis not present

## 2021-07-29 DIAGNOSIS — Z0000 Encounter for general adult medical examination without abnormal findings: Secondary | ICD-10-CM | POA: Diagnosis not present

## 2021-07-29 DIAGNOSIS — Z0001 Encounter for general adult medical examination with abnormal findings: Secondary | ICD-10-CM | POA: Diagnosis not present

## 2021-10-01 DIAGNOSIS — R300 Dysuria: Secondary | ICD-10-CM | POA: Diagnosis not present

## 2021-10-01 DIAGNOSIS — N390 Urinary tract infection, site not specified: Secondary | ICD-10-CM | POA: Diagnosis not present

## 2021-10-05 DIAGNOSIS — H6123 Impacted cerumen, bilateral: Secondary | ICD-10-CM | POA: Diagnosis not present

## 2021-10-05 DIAGNOSIS — N390 Urinary tract infection, site not specified: Secondary | ICD-10-CM | POA: Diagnosis not present

## 2021-10-05 DIAGNOSIS — I10 Essential (primary) hypertension: Secondary | ICD-10-CM | POA: Diagnosis not present

## 2021-11-25 ENCOUNTER — Inpatient Hospital Stay
Admission: EM | Admit: 2021-11-25 | Discharge: 2021-12-02 | DRG: 065 | Disposition: A | Discharge status: Rehabilitation Facility | Payer: Medicare HMO | Attending: Osteopathic Medicine | Admitting: Osteopathic Medicine

## 2021-11-25 ENCOUNTER — Encounter: Payer: Self-pay | Admitting: Radiology

## 2021-11-25 ENCOUNTER — Emergency Department: Payer: Medicare HMO

## 2021-11-25 ENCOUNTER — Other Ambulatory Visit: Payer: Self-pay

## 2021-11-25 DIAGNOSIS — E8809 Other disorders of plasma-protein metabolism, not elsewhere classified: Secondary | ICD-10-CM | POA: Diagnosis not present

## 2021-11-25 DIAGNOSIS — D649 Anemia, unspecified: Secondary | ICD-10-CM | POA: Diagnosis not present

## 2021-11-25 DIAGNOSIS — I639 Cerebral infarction, unspecified: Principal | ICD-10-CM

## 2021-11-25 DIAGNOSIS — Z79899 Other long term (current) drug therapy: Secondary | ICD-10-CM

## 2021-11-25 DIAGNOSIS — M48061 Spinal stenosis, lumbar region without neurogenic claudication: Secondary | ICD-10-CM | POA: Diagnosis present

## 2021-11-25 DIAGNOSIS — M47816 Spondylosis without myelopathy or radiculopathy, lumbar region: Secondary | ICD-10-CM | POA: Diagnosis present

## 2021-11-25 DIAGNOSIS — R29898 Other symptoms and signs involving the musculoskeletal system: Secondary | ICD-10-CM | POA: Diagnosis not present

## 2021-11-25 DIAGNOSIS — M47814 Spondylosis without myelopathy or radiculopathy, thoracic region: Secondary | ICD-10-CM | POA: Diagnosis not present

## 2021-11-25 DIAGNOSIS — Y929 Unspecified place or not applicable: Secondary | ICD-10-CM

## 2021-11-25 DIAGNOSIS — I2510 Atherosclerotic heart disease of native coronary artery without angina pectoris: Secondary | ICD-10-CM | POA: Diagnosis present

## 2021-11-25 DIAGNOSIS — I10 Essential (primary) hypertension: Secondary | ICD-10-CM | POA: Diagnosis present

## 2021-11-25 DIAGNOSIS — Z20822 Contact with and (suspected) exposure to covid-19: Secondary | ICD-10-CM | POA: Diagnosis not present

## 2021-11-25 DIAGNOSIS — R29818 Other symptoms and signs involving the nervous system: Secondary | ICD-10-CM | POA: Diagnosis not present

## 2021-11-25 DIAGNOSIS — G459 Transient cerebral ischemic attack, unspecified: Secondary | ICD-10-CM | POA: Diagnosis not present

## 2021-11-25 DIAGNOSIS — I6523 Occlusion and stenosis of bilateral carotid arteries: Secondary | ICD-10-CM | POA: Diagnosis not present

## 2021-11-25 DIAGNOSIS — K089 Disorder of teeth and supporting structures, unspecified: Secondary | ICD-10-CM | POA: Diagnosis not present

## 2021-11-25 DIAGNOSIS — R32 Unspecified urinary incontinence: Secondary | ICD-10-CM | POA: Diagnosis present

## 2021-11-25 DIAGNOSIS — W19XXXA Unspecified fall, initial encounter: Secondary | ICD-10-CM | POA: Diagnosis present

## 2021-11-25 DIAGNOSIS — R270 Ataxia, unspecified: Secondary | ICD-10-CM | POA: Diagnosis not present

## 2021-11-25 DIAGNOSIS — I6389 Other cerebral infarction: Principal | ICD-10-CM | POA: Diagnosis present

## 2021-11-25 DIAGNOSIS — R279 Unspecified lack of coordination: Secondary | ICD-10-CM | POA: Diagnosis present

## 2021-11-25 DIAGNOSIS — M5030 Other cervical disc degeneration, unspecified cervical region: Secondary | ICD-10-CM | POA: Diagnosis present

## 2021-11-25 DIAGNOSIS — M5136 Other intervertebral disc degeneration, lumbar region: Secondary | ICD-10-CM | POA: Diagnosis present

## 2021-11-25 DIAGNOSIS — K5900 Constipation, unspecified: Secondary | ICD-10-CM | POA: Diagnosis not present

## 2021-11-25 DIAGNOSIS — M4802 Spinal stenosis, cervical region: Secondary | ICD-10-CM | POA: Diagnosis not present

## 2021-11-25 DIAGNOSIS — E785 Hyperlipidemia, unspecified: Secondary | ICD-10-CM | POA: Diagnosis not present

## 2021-11-25 DIAGNOSIS — G8194 Hemiplegia, unspecified affecting left nondominant side: Secondary | ICD-10-CM | POA: Diagnosis not present

## 2021-11-25 DIAGNOSIS — Z8249 Family history of ischemic heart disease and other diseases of the circulatory system: Secondary | ICD-10-CM

## 2021-11-25 DIAGNOSIS — I6381 Other cerebral infarction due to occlusion or stenosis of small artery: Secondary | ICD-10-CM | POA: Diagnosis not present

## 2021-11-25 DIAGNOSIS — M47812 Spondylosis without myelopathy or radiculopathy, cervical region: Secondary | ICD-10-CM | POA: Diagnosis not present

## 2021-11-25 DIAGNOSIS — R29706 NIHSS score 6: Secondary | ICD-10-CM | POA: Diagnosis present

## 2021-11-25 DIAGNOSIS — R200 Anesthesia of skin: Secondary | ICD-10-CM | POA: Diagnosis not present

## 2021-11-25 DIAGNOSIS — Z823 Family history of stroke: Secondary | ICD-10-CM | POA: Diagnosis not present

## 2021-11-25 DIAGNOSIS — R531 Weakness: Secondary | ICD-10-CM | POA: Diagnosis present

## 2021-11-25 DIAGNOSIS — R4182 Altered mental status, unspecified: Secondary | ICD-10-CM | POA: Diagnosis not present

## 2021-11-25 DIAGNOSIS — R471 Dysarthria and anarthria: Secondary | ICD-10-CM | POA: Diagnosis not present

## 2021-11-25 DIAGNOSIS — R7989 Other specified abnormal findings of blood chemistry: Secondary | ICD-10-CM | POA: Diagnosis not present

## 2021-11-25 LAB — URINALYSIS, ROUTINE W REFLEX MICROSCOPIC
Bacteria, UA: NONE SEEN
Bilirubin Urine: NEGATIVE
Glucose, UA: NEGATIVE mg/dL
Ketones, ur: NEGATIVE mg/dL
Leukocytes,Ua: NEGATIVE
Nitrite: NEGATIVE
Protein, ur: NEGATIVE mg/dL
Specific Gravity, Urine: 1.023 (ref 1.005–1.030)
Squamous Epithelial / HPF: NONE SEEN (ref 0–5)
pH: 6 (ref 5.0–8.0)

## 2021-11-25 LAB — URINE DRUG SCREEN, QUALITATIVE (ARMC ONLY)
Amphetamines, Ur Screen: NOT DETECTED
Barbiturates, Ur Screen: NOT DETECTED
Benzodiazepine, Ur Scrn: NOT DETECTED
Cannabinoid 50 Ng, Ur ~~LOC~~: NOT DETECTED
Cocaine Metabolite,Ur ~~LOC~~: NOT DETECTED
MDMA (Ecstasy)Ur Screen: NOT DETECTED
Methadone Scn, Ur: NOT DETECTED
Opiate, Ur Screen: NOT DETECTED
Phencyclidine (PCP) Ur S: NOT DETECTED
Tricyclic, Ur Screen: NOT DETECTED

## 2021-11-25 LAB — DIFFERENTIAL
Abs Immature Granulocytes: 0.01 10*3/uL (ref 0.00–0.07)
Basophils Absolute: 0 10*3/uL (ref 0.0–0.1)
Basophils Relative: 1 %
Eosinophils Absolute: 0.1 10*3/uL (ref 0.0–0.5)
Eosinophils Relative: 2 %
Immature Granulocytes: 0 %
Lymphocytes Relative: 21 %
Lymphs Abs: 1 10*3/uL (ref 0.7–4.0)
Monocytes Absolute: 0.4 10*3/uL (ref 0.1–1.0)
Monocytes Relative: 10 %
Neutro Abs: 3 10*3/uL (ref 1.7–7.7)
Neutrophils Relative %: 66 %

## 2021-11-25 LAB — IRON AND TIBC
Iron: 108 ug/dL (ref 45–182)
Saturation Ratios: 34 % (ref 17.9–39.5)
TIBC: 319 ug/dL (ref 250–450)
UIBC: 211 ug/dL

## 2021-11-25 LAB — FERRITIN: Ferritin: 35 ng/mL (ref 24–336)

## 2021-11-25 LAB — RETICULOCYTES
Immature Retic Fract: 9.3 % (ref 2.3–15.9)
RBC.: 4.49 MIL/uL (ref 4.22–5.81)
Retic Count, Absolute: 53 10*3/uL (ref 19.0–186.0)
Retic Ct Pct: 1.2 % (ref 0.4–3.1)

## 2021-11-25 LAB — COMPREHENSIVE METABOLIC PANEL
ALT: 18 U/L (ref 0–44)
AST: 20 U/L (ref 15–41)
Albumin: 3.4 g/dL — ABNORMAL LOW (ref 3.5–5.0)
Alkaline Phosphatase: 60 U/L (ref 38–126)
Anion gap: 10 (ref 5–15)
BUN: 18 mg/dL (ref 8–23)
CO2: 22 mmol/L (ref 22–32)
Calcium: 8.6 mg/dL — ABNORMAL LOW (ref 8.9–10.3)
Chloride: 107 mmol/L (ref 98–111)
Creatinine, Ser: 0.92 mg/dL (ref 0.61–1.24)
GFR, Estimated: >60 mL/min (ref >60)
Glucose, Bld: 97 mg/dL (ref 70–99)
Potassium: 3.6 mmol/L (ref 3.5–5.1)
Sodium: 139 mmol/L (ref 135–145)
Total Bilirubin: 0.5 mg/dL (ref 0.3–1.2)
Total Protein: 7.2 g/dL (ref 6.5–8.1)

## 2021-11-25 LAB — CBC
HCT: 37.7 % — ABNORMAL LOW (ref 39.0–52.0)
Hemoglobin: 12.1 g/dL — ABNORMAL LOW (ref 13.0–17.0)
MCH: 27.3 pg (ref 26.0–34.0)
MCHC: 32.1 g/dL (ref 30.0–36.0)
MCV: 85.1 fL (ref 80.0–100.0)
Platelets: 265 10*3/uL (ref 150–400)
RBC: 4.43 MIL/uL (ref 4.22–5.81)
RDW: 13.6 % (ref 11.5–15.5)
WBC: 4.6 10*3/uL (ref 4.0–10.5)
nRBC: 0 % (ref 0.0–0.2)

## 2021-11-25 LAB — APTT: aPTT: 29 seconds (ref 24–36)

## 2021-11-25 LAB — ETHANOL: Alcohol, Ethyl (B): <10 mg/dL (ref <10)

## 2021-11-25 LAB — PROTIME-INR
INR: 1.1 (ref 0.8–1.2)
Prothrombin Time: 14.2 seconds (ref 11.4–15.2)

## 2021-11-25 LAB — RESP PANEL BY RT-PCR (FLU A&B, COVID) ARPGX2
Influenza A by PCR: NEGATIVE
Influenza B by PCR: NEGATIVE
SARS Coronavirus 2 by RT PCR: NEGATIVE

## 2021-11-25 LAB — CBG MONITORING, ED: Glucose-Capillary: 99 mg/dL (ref 70–99)

## 2021-11-25 LAB — FOLATE: Folate: 17.7 ng/mL (ref >5.9)

## 2021-11-25 MED ORDER — IOHEXOL 350 MG/ML SOLN
100.0000 mL | Freq: Once | INTRAVENOUS | Status: AC | PRN
  Administered 2021-11-25: 100 mL via INTRAVENOUS

## 2021-11-25 MED ORDER — ACETAMINOPHEN 325 MG PO TABS
650.0000 mg | ORAL_TABLET | ORAL | Status: DC | PRN
  Administered 2021-11-26 – 2021-11-30 (×2): 650 mg via ORAL
  Filled 2021-11-25 (×2): qty 2

## 2021-11-25 MED ORDER — SODIUM CHLORIDE 0.9 % IV SOLN: INTRAVENOUS | Status: AC

## 2021-11-25 MED ORDER — CLOPIDOGREL BISULFATE 75 MG PO TABS: 75.0000 mg | ORAL_TABLET | Freq: Every day | ORAL | Status: DC

## 2021-11-25 MED ORDER — ASPIRIN 81 MG PO CHEW
81.0000 mg | CHEWABLE_TABLET | Freq: Every day | ORAL | Status: DC
  Administered 2021-11-26 – 2021-12-02 (×7): 81 mg via ORAL
  Filled 2021-11-25 (×7): qty 1

## 2021-11-25 MED ORDER — STROKE: EARLY STAGES OF RECOVERY BOOK: Freq: Once | Status: AC

## 2021-11-25 MED ORDER — GADOPICLENOL 0.5 MMOL/ML IV SOLN
8.0000 mL | Freq: Once | INTRAVENOUS | Status: AC | PRN
  Administered 2021-11-25: 8 mL via INTRAVENOUS

## 2021-11-25 MED ORDER — HEPARIN SODIUM (PORCINE) 5000 UNIT/ML IJ SOLN
5000.0000 [IU] | Freq: Three times a day (TID) | INTRAMUSCULAR | Status: DC
  Administered 2021-11-26 – 2021-12-02 (×18): 5000 [IU] via SUBCUTANEOUS
  Filled 2021-11-25 (×18): qty 1

## 2021-11-25 MED ORDER — SENNOSIDES-DOCUSATE SODIUM 8.6-50 MG PO TABS
1.0000 | ORAL_TABLET | Freq: Every evening | ORAL | Status: DC | PRN
  Administered 2021-11-28: 1 via ORAL
  Filled 2021-11-25: qty 1

## 2021-11-25 MED ORDER — ASPIRIN 81 MG PO CHEW
324.0000 mg | CHEWABLE_TABLET | Freq: Once | ORAL | Status: AC
  Administered 2021-11-25: 324 mg via ORAL
  Filled 2021-11-25: qty 4

## 2021-11-25 MED ORDER — ACETAMINOPHEN 160 MG/5ML PO SOLN: 650.0000 mg | ORAL | Status: DC | PRN

## 2021-11-25 MED ORDER — ACETAMINOPHEN 650 MG RE SUPP: 650.0000 mg | RECTAL | Status: DC | PRN

## 2021-11-25 NOTE — H&P (Signed)
History and Physical    Kevin [name] [id number] DOB: 02-20-1934 DOA: 11/25/2021  PCP: Leonel Ramsay, MD  Patient coming from: home  I have personally briefly reviewed patient's old medical records in [place]  Chief Complaint: left sided weakness  HPI: Kevin [name] is a 88 y.o. male with medical history significant of  HTN, CAD non obstructive, Anemia  who presents to ED with complaint of left sided weakness . Per patient this am on  waking around 1 am he felt he was not able to move his arm or left leg. He states he attempted to ambulate and fell.  He notes no associated HA, dizzy , chest pain , slurred speech, confusion, difficulty swallowing or prior symptoms like this in the past. ON further ros no fever/ chills/ n/v/cough or sob.   ED Course:  Vitals: Afeb, hr 68, rr 18, sat 100% on ra  CODE CVA CTH:. No evidence of acute intracranial abnormality. CTA neck:1. No emergent large vessel occlusion or proximal hemodynamically significant stenosis. 2. No evidence of core infarct or penumbra on CT perfusion.   MRI Acute infarct right lateral thalamus    MRI spine Lumbar spondylosis. Multilevel spinal and subarticular stenosis  Mild thoracic degenerative change most prominent at T10-11. No significant stenosis    EKG: Nsr, with ectopic atrial rhythm /LVH  Labs: Wbc 4.6, hgb 12.1 plt 265  Na 139, K 3.6, cr 0.92  Respiratory panel: neg  Review of Systems: As per HPI otherwise 10 point review of systems negative.   Past Medical History:  Diagnosis Date   Anemia    Colon polyps    Coronary artery disease    Hematuria    History of hiatal hernia    Hypertension     Past Surgical History:  Procedure Laterality Date   COLONOSCOPY WITH ESOPHAGOGASTRODUODENOSCOPY (EGD)     COLONOSCOPY WITH PROPOFOL N/A 09/27/2014   Procedure: COLONOSCOPY WITH PROPOFOL;  Surgeon: Manya Silvas, MD;  Location: [facility];  Service: Endoscopy;  Laterality: N/A;    JOINT REPLACEMENT       reports that he has never smoked. He does not have any smokeless tobacco history on file. He reports that he does not drink alcohol and does not use drugs.  No Known Allergies  No family history on file. FH Mother : cancer nos  Father: CAD./MI, Stroke   Prior to Admission medications   Medication Sig Start Date End Date Taking? Authorizing Provider  amLODipine (NORVASC) 10 MG tablet Take 10 mg by mouth daily.   Yes [provider]  triamterene-hydrochlorothiazide (MAXZIDE-25) 37.5-25 MG per tablet Take 1 tablet by mouth daily.   Yes [provider]    Physical Exam: Vitals:   11/25/21 1208 11/25/21 1219 11/25/21 1430 11/25/21 1500  BP: (!) 193/75  (!) 142/81 (!) 166/79  Pulse: 68  69 69  Resp: 18  15 16   Temp: 98.2 F (36.8 C)     TempSrc: Oral     SpO2: 100%     Weight:  86.2 kg    Height:  6\' 2"  (1.88 m)      Vitals:   11/25/21 1208 11/25/21 1219 11/25/21 1430 11/25/21 1500  BP: (!) 193/75  (!) 142/81 (!) 166/79  Pulse: 68  69 69  Resp: 18  15 16   Temp: 98.2 F (36.8 C)     TempSrc: Oral     SpO2: 100%     Weight:  86.2 kg  Height:  6\' 2"  (1.88 m)     Constitutional: NAD, calm, comfortable Eyes: PERRL, lids and conjunctivae normal ENMT: Mucous membranes are moist. Posterior pharynx clear of any exudate or lesions. endentulous  Neck: normal, supple, no masses, no thyromegaly Respiratory: clear to auscultation bilaterally, no wheezing, no crackles. Normal respiratory effort. No accessory muscle use.  Cardiovascular: Regular rate and rhythm, no murmurs / rubs / gallops. No extremity edema. 2+ pedal pulses. Abdomen: no tenderness, no masses palpated. No hepatosplenomegaly. Bowel sounds positive.  Musculoskeletal: no clubbing / cyanosis. No joint deformity upper and lower extremities. Good ROM, no contractures. Normal muscle tone.  Skin: no rashes, lesions, ulcers. No induration Neurologic: CN 2-12 grossly intact. Sensation  intact,  4/5 in Left upper ext, 3/5 left lower ext , 5/5 RUE and RLE   Psychiatric: Normal judgment and insight. Alert and oriented x 3. Normal mood.    Labs on Admission: I have personally reviewed following labs and imaging studies  CBC: Recent Labs  Lab 11/25/21 1435  WBC 4.6  NEUTROABS 3.0  HGB 12.1*  HCT 37.7*  MCV 85.1  PLT 595   Basic Metabolic Panel: Recent Labs  Lab 11/25/21 1435  NA 139  K 3.6  CL 107  CO2 22  GLUCOSE 97  BUN 18  CREATININE 0.92  CALCIUM 8.6*   GFR: Estimated Creatinine Clearance: 67 mL/min (by C-G formula based on SCr of 0.92 mg/dL). Liver Function Tests: Recent Labs  Lab 11/25/21 1435  AST 20  ALT 18  ALKPHOS 60  BILITOT 0.5  PROT 7.2  ALBUMIN 3.4*   No results for input(s): "LIPASE", "AMYLASE" in the last 168 hours. No results for input(s): "AMMONIA" in the last 168 hours. Coagulation Profile: Recent Labs  Lab 11/25/21 1435  INR 1.1   Cardiac Enzymes: No results for input(s): "CKTOTAL", "CKMB", "CKMBINDEX", "TROPONINI" in the last 168 hours. BNP (last 3 results) No results for input(s): "PROBNP" in the last 8760 hours. HbA1C: No results for input(s): "HGBA1C" in the last 72 hours. CBG: No results for input(s): "GLUCAP" in the last 168 hours. Lipid Profile: No results for input(s): "CHOL", "HDL", "LDLCALC", "TRIG", "CHOLHDL", "LDLDIRECT" in the last 72 hours. Thyroid Function Tests: No results for input(s): "TSH", "T4TOTAL", "FREET4", "T3FREE", "THYROIDAB" in the last 72 hours. Anemia Panel: No results for input(s): "VITAMINB12", "FOLATE", "FERRITIN", "TIBC", "IRON", "RETICCTPCT" in the last 72 hours. Urine analysis:    Component Value Date/Time   COLORURINE Straw 12/06/2012 1252   APPEARANCEUR Clear 12/06/2012 1252   LABSPEC 1.013 12/06/2012 1252   PHURINE 6.0 12/06/2012 1252   GLUCOSEU Negative 12/06/2012 1252   HGBUR Negative 12/06/2012 1252   BILIRUBINUR Negative 12/06/2012 1252   KETONESUR Negative 12/06/2012  1252   PROTEINUR Negative 12/06/2012 1252   NITRITE Negative 12/06/2012 1252   LEUKOCYTESUR Negative 12/06/2012 1252    Radiological Exams on Admission: MR THORACIC SPINE W WO CONTRAST  Result Date: 11/25/2021 CLINICAL DATA:  Leg weakness and ataxia.  Left-sided numbness EXAM: MRI THORACIC WITHOUT AND WITH CONTRAST TECHNIQUE: Multiplanar and multiecho pulse sequences of the thoracic spine were obtained without and with intravenous contrast. CONTRAST:  8 mL Vueway IV COMPARISON:  None Available. FINDINGS: Alignment:  Normal Vertebrae: Negative for fracture or mass. Schmorl's node superior endplate of G38 with edema and enhancement. Cord:  Normal signal and morphology Paraspinal and other soft tissues: Bilateral renal cysts. Largest cyst on the left 5.7 cm. Disc levels: Mild thoracic disc degeneration with disc space narrowing. Disc and facet  degeneration at T11-12. No significant spinal stenosis. IMPRESSION: Mild thoracic degenerative change most prominent at T10-11. No significant stenosis Negative for thoracic fracture or mass lesion.  None no Electronically Signed   By: Franchot Gallo M.D.   On: 11/25/2021 14:39   MR Lumbar Spine W Wo Contrast  Result Date: 11/25/2021 CLINICAL DATA:  Low back pain. Rule out cauda equina syndrome. Leg weakness, ataxia. Numbness on left EXAM: MRI LUMBAR SPINE WITHOUT AND WITH CONTRAST TECHNIQUE: Multiplanar and multiecho pulse sequences of the lumbar spine were obtained without and with intravenous contrast. CONTRAST:  8 mL Vueway IV COMPARISON:  None Available. FINDINGS: Segmentation:  5 lumbar vertebra.  Lowest disc space L5-S1 Alignment:  Mild anterolisthesis L4-5 Vertebrae:  Negative for fracture or mass. Schmorl's node with bone marrow edema and enhancement in the superior endplate of 624THL. Disc degeneration with discogenic sclerosis and edema at L2-3 eccentric to the left. Hemangioma L4 vertebral body. Conus medullaris and cauda equina: Conus extends to the L1-2  level. Conus and cauda equina appear normal. Paraspinal and other soft tissues: Bilateral renal cysts. Largest cyst on the right measures 4 cm. Largest cyst on the left 5.7 cm. No paraspinous adenopathy. Disc levels: L1-2: Mild disc bulging and facet degeneration. No significant stenosis. L2-3: Disc degeneration with discogenic changes. Disc degeneration and spurring more prominent on the left than the right. Moderate left subarticular and foraminal stenosis. Mild right subarticular stenosis. Borderline spinal stenosis L3-4: Advanced disc degeneration with disc space narrowing and diffuse endplate spurring. Moderate facet hypertrophy. Moderate central canal stenosis. Moderate subarticular and foraminal stenosis bilaterally due to spurring L4-5: Mild anterolisthesis. Disc degeneration with diffuse disc bulging. Bilateral facet hypertrophy. Mild spinal stenosis. Moderate subarticular stenosis bilaterally. L5-S1: Disc and facet degeneration.  No significant stenosis. IMPRESSION: Lumbar spondylosis. Multilevel spinal and subarticular stenosis as above Negative for fracture Electronically Signed   By: Franchot Gallo M.D.   On: 11/25/2021 14:35   MR BRAIN W WO CONTRAST  Result Date: 11/25/2021 CLINICAL DATA:  TIA. Acute neuro deficit. Ataxia. Left-sided numbness. EXAM: MRI HEAD WITHOUT AND WITH CONTRAST TECHNIQUE: Multiplanar, multiecho pulse sequences of the brain and surrounding structures were obtained without and with intravenous contrast. CONTRAST:  8 mL Vueway IV COMPARISON:  CT head 11/25/2021 FINDINGS: Brain: Acute infarct right lateral thalamus. No other acute infarct. Mild ventricular enlargement consistent with the degree of atrophy. Moderate white matter changes with periventricular deep white matter hyperintensity bilaterally. Negative for hemorrhage or mass. Normal enhancement. Vascular: Normal arterial flow voids Skull and upper cervical spine: No focal skeletal lesion. Cervical spondylosis.  Sinuses/Orbits: Mild mucosal edema paranasal sinuses. Negative orbit Other: None IMPRESSION: Acute infarct right lateral thalamus Atrophy and moderate white matter changes consistent with chronic microvascular ischemia. Electronically Signed   By: Franchot Gallo M.D.   On: 11/25/2021 14:26   CT ANGIO HEAD NECK W WO CM W PERF (CODE STROKE)  Result Date: 11/25/2021 CLINICAL DATA:  Neuro deficit, acute, stroke suspected EXAM: CT ANGIOGRAPHY HEAD AND NECK CT PERFUSION BRAIN TECHNIQUE: Multidetector CT imaging of the head and neck was performed using the standard protocol during bolus administration of intravenous contrast. Multiplanar CT image reconstructions and MIPs were obtained to evaluate the vascular anatomy. Carotid stenosis measurements (when applicable) are obtained utilizing NASCET criteria, using the distal internal carotid diameter as the denominator. Multiphase CT imaging of the brain was performed following IV bolus contrast injection. Subsequent parametric perfusion maps were calculated using RAPID software. RADIATION DOSE REDUCTION: This exam was performed  according to the departmental dose-optimization program which includes automated exposure control, adjustment of the mA and/or kV according to patient size and/or use of iterative reconstruction technique. CONTRAST:  140mL OMNIPAQUE IOHEXOL 350 MG/ML SOLN COMPARISON:  None Available. FINDINGS: CTA NECK FINDINGS Aortic arch: Great vessel origins are patent without significant stenosis. Calcific atherosclerosis of the aorta. Right carotid system: No evidence of dissection, stenosis (50% or greater), or occlusion. Mild atherosclerosis. Left carotid system: No evidence of dissection, stenosis (50% or greater), or occlusion. Moderate atherosclerosis at the carotid bifurcation. Vertebral arteries: Right dominant. No evidence of dissection, stenosis (50% or greater), or occlusion. Skeleton: Severe multilevel degenerative change in the cervical spine.  Other neck: No acute findings. Upper chest: Visualized lung apices are clear. Review of the MIP images confirms the above findings CTA HEAD FINDINGS Anterior circulation: Bilateral intracranial ICAs are patent with calcific atherosclerosis but no significant stenosis. Bilateral MCAs and bilateral ACAs are patent without proximal hemodynamically significant stenosis. No aneurysm identified. Posterior circulation: Bilateral intradural vertebral arteries, basilar artery and bilateral posterior cerebral arteries are patent without proximal hemodynamically significant stenosis. Venous sinuses: As permitted by contrast timing, patent. CT Brain Perfusion Findings: ASPECTS: 10. CBF (<30%) Volume: 103mL Perfusion (Tmax>6.0s) volume: 101mL Mismatch Volume: 32mL Infarction Location:None identified IMPRESSION: 1. No emergent large vessel occlusion or proximal hemodynamically significant stenosis. 2. No evidence of core infarct or penumbra on CT perfusion. Findings discussed with Dr. Quinn Axe via telephone at 12:45 p.m. Electronically Signed   By: Margaretha Sheffield M.D.   On: 11/25/2021 12:54   CT HEAD CODE STROKE WO CONTRAST  Result Date: 11/25/2021 CLINICAL DATA:  Code stroke.  Neuro deficit, acute, stroke suspected EXAM: CT HEAD WITHOUT CONTRAST TECHNIQUE: Contiguous axial images were obtained from the base of the skull through the vertex without intravenous contrast. RADIATION DOSE REDUCTION: This exam was performed according to the departmental dose-optimization program which includes automated exposure control, adjustment of the mA and/or kV according to patient size and/or use of iterative reconstruction technique. COMPARISON:  None Available. FINDINGS: Brain: No evidence of acute large vascular territory infarction, hemorrhage, hydrocephalus, extra-axial collection or mass lesion/mass effect. Patchy white matter hypodensities, nonspecific but compatible with chronic microvascular ischemic disease. Vascular: No hyperdense  vessel identified. Calcific atherosclerosis. Skull: No acute fracture. Sinuses/Orbits: Mild paranasal sinus mucosal thickening. No acute orbital findings. Other: No mastoid effusions. ASPECTS [facility] Stroke Program Early CT Score) total score (0-10 with 10 being normal): 10. IMPRESSION: 1. No evidence of acute intracranial abnormality. 2. ASPECTS is 10. Code stroke imaging results were communicated on 11/25/2021 at 12:26 pm to provider Quinn Axe via telephone, who verbally acknowledged these results. Electronically Signed   By: Margaretha Sheffield M.D.   On: 11/25/2021 12:26    EKG: Independently reviewed. See above  Assessment/Plan  Acute infarct right lateral thalamus -persistent left sided weakness  -CTH, CTA head and neck unremarkable  -MRI head  + for right thalamic cva -admit tia/cva r/o protocol -MRI brain pending  -A1c/HLD pending -Permissive HTN x48 hours from sx onset  -goal BP < 220/110, PRN above these parameters -  ASA 81mg  daily + plavix 75mg  daily x21 days f/b ASA 81mg  daily monotherapy after that -high intensity statin -neuro checks , SLP, PT/OT  -await final neuro recs       HTN -permissive HTN x 48 hours ,hold oral medications for now   CAD  -no active issues -no hx of MI or intervention   Anemia -at baseline  -check baseline anemia labs  Recent Oral surgery -full mouth teeth extraction  -complete post procedure out patient Abx       DVT prophylaxis: scd/heparin   Family Communication: [place] (POA),[name] (Daughter)  [phone] ([place]) Disposition Plan: patient  expected to be admitted greater than 2 midnights  Consults called: DR  Quinn Axe Admission status: progressive care   Clance Boll MD [facility]   If 7PM-7AM, please contact night-coverage [email or web address] Password [facility]  11/25/2021, 3:53 PM

## 2021-11-25 NOTE — ED Notes (Signed)
Unable to get labs. Blood very viscous. IV in place with 2 RNs attempting.

## 2021-11-25 NOTE — Progress Notes (Signed)
   11/25/21 1300  Clinical Encounter Type  Visited With Patient not available;Family  Visit Type Initial;ED;Code  Referral From Nurse  Consult/Referral To Chaplain   Chaplain responded to Code Stroke. Patient not available however Chaplain provided presence and comfort to family members waiting.

## 2021-11-25 NOTE — ED Provider Notes (Signed)
[facility] Provider Note    Event Date/Time   First MD Initiated Contact with Patient 11/25/21 1237     (approximate)   History   Fall and Weakness   HPI  Kevin [name] is a 88 y.o. male past medical history of coronary disease, hypertension and anemia who presents with left-sided weakness.  Woke up around 1 AM and felt like he could not move the left leg.  Had difficulty ambulating.  Also felt like he could not lose his left arm.  Denies any difficulty with speech or vision change.  Denies headache.  No chest pain difficulty breathing.  Denies neck or back pain.  No prior history of CVA.     Past Medical History:  Diagnosis Date   Anemia    Colon polyps    Coronary artery disease    Hematuria    History of hiatal hernia    Hypertension     Patient Active Problem List   Diagnosis Date Noted   Acute CVA (cerebrovascular accident) (HCC) 11/25/2021     Physical Exam  Triage Vital Signs: ED Triage Vitals  Enc Vitals Group     BP 11/25/21 1208 (!) 193/75     Pulse Rate 11/25/21 1208 68     Resp 11/25/21 1208 18     Temp 11/25/21 1208 98.2 F (36.8 C)     Temp Source 11/25/21 1208 Oral     SpO2 11/25/21 1208 100 %     Weight 11/25/21 1219 190 lb 0.6 oz (86.2 kg)     Height 11/25/21 1219 6\' 2"  (1.88 m)     Head Circumference --      Peak Flow --      Pain Score 11/25/21 1219 0     Pain Loc --      Pain Edu? --      Excl. in GC? --     Most recent vital signs: Vitals:   11/25/21 1430 11/25/21 1500  BP: (!) 142/81 (!) 166/79  Pulse: 69 69  Resp: 15 16  Temp:    SpO2:       General: Awake, no distress.  CV:  Good peripheral perfusion.  Resp:  Normal effort.  Abd:  No distention.  Neuro:             Awake, Alert, Oriented x 3  Other:  Aox3, nml speech  PERRL, EOMI, face symmetric, nml tongue movement  2 out of 5 strength in left lower extremity compared to 5 out of 5 on the right, 4-5 in the left upper extremity compared to 5  out of 5 on the right Sensation grossly intact in the BL upper and lower extremities  Finger-nose-finger intact BL    ED Results / Procedures / Treatments  Labs (all labs ordered are listed, but only abnormal results are displayed) Labs Reviewed  CBC - Abnormal; Notable for the following components:      Result Value   Hemoglobin 12.1 (*)    HCT 37.7 (*)    All other components within normal limits  COMPREHENSIVE METABOLIC PANEL - Abnormal; Notable for the following components:   Calcium 8.6 (*)    Albumin 3.4 (*)    All other components within normal limits  RESP PANEL BY RT-PCR (FLU A&B, COVID) ARPGX2  ETHANOL  PROTIME-INR  APTT  DIFFERENTIAL  URINE DRUG SCREEN, QUALITATIVE ([facility] ONLY)  URINALYSIS, ROUTINE W REFLEX MICROSCOPIC  CBG MONITORING, ED     EKG  EKG shows sinus rhythm with LVH ST elevation in the anterior leads likely repolarization abnormality   RADIOLOGY I reviewed and interpreted the CT scan of the brain which does not show any acute intracranial process    PROCEDURES:  Critical Care performed: Yes, see critical care procedure note(s)  Procedures  The patient is on the cardiac monitor to evaluate for evidence of arrhythmia and/or significant heart rate changes.   MEDICATIONS ORDERED IN ED: Medications  iohexol (OMNIPAQUE) 350 MG/ML injection 100 mL (100 mLs Intravenous Contrast Given 11/25/21 1228)  gadopiclenol (VUEWAY) 0.5 MMOL/ML solution 8 mL (8 mLs Intravenous Contrast Given 11/25/21 1420)  aspirin chewable tablet 324 mg (324 mg Oral Given 11/25/21 1528)     IMPRESSION / MDM / ASSESSMENT AND PLAN / ED COURSE  I reviewed the triage vital signs and the nursing notes.                              Patient's presentation is most consistent with acute presentation with potential threat to life or bodily function.  Differential diagnosis includes, but is not limited to, CVA, lumbar spinal cord compression secondary to herniated disc, spinal  epidural abscess, brain mass, intracranial hemorrhage  Patient is a 88 year old male presents with left-sided weakness.  Last known normal was around 7 PM last night when he went to bed.  Woke up around 1 AM with weakness on the left side involving the left arm and left leg.  No visual or facial symptoms.  On evaluation in triage she was made a stroke alert.  Patient went to CT CTA and MRI prior to me evaluating him, was seen by Dr. Quinn Axe with neurology..  Patient had MRI CT and L-spine and MRI brain.  He has an acute thalamic infarct.  MRI of the T and L-spine did not have any acute findings C-spine still pending.  Patient's labs are reassuring.  EKG showing sinus rhythm.  On my evaluation he does have significant weakness primarily in the left leg more than the left arm.  Suspect he will need rehab.  I given him 324 of aspirin.  Discussed with the hospitalist who will admit the patient.      FINAL CLINICAL IMPRESSION(S) / ED DIAGNOSES   Final diagnoses:  None     Rx / DC Orders   ED Discharge Orders     None        Note:  This document was prepared using Dragon voice recognition software and may include unintentional dictation errors.   Rada Hay, MD 11/25/21 1620

## 2021-11-25 NOTE — ED Notes (Signed)
Called to [facility] Per RN Dee/Activated Code Stroke/Rep:Thomas

## 2021-11-25 NOTE — ED Notes (Signed)
First NIHSS taken by this RN at 1440.

## 2021-11-25 NOTE — ED Provider Triage Note (Signed)
Emergency Medicine Provider Triage Evaluation Note  Kevin [name] , a 88 y.o. male  was evaluated in triage.  Pt complains of left-sided weakness.  Feels like he cannot control his left arm, weakness in the left leg.  Review of Systems  Positive: Weakness Negative:   Physical Exam  BP (!) 193/75 (BP Location: Left Arm)   Pulse 68   Temp 98.2 F (36.8 C) (Oral)   Resp 18   SpO2 100%  Gen:   Awake, no distress   Resp:  Normal effort  MSK:   Moves extremities without difficulty  Other:    Medical Decision Making  Medically screening exam initiated at 12:09 PM.  Appropriate orders placed.  Kevin [name] was informed that the remainder of the evaluation will be completed by another provider, this initial triage assessment does not replace that evaluation, and the importance of remaining in the ED until their evaluation is complete.  Last known well was 4 AM, nursing staff consulted with Dr. Cheri Fowler to call code stroke   Kevin Starks, PA-C 11/25/21 1210

## 2021-11-25 NOTE — Consult Note (Addendum)
NEUROLOGY CONSULTATION NOTE   Date of service: November 25, 2021 Patient Name: Kevin [name] MRN:  [id number] DOB:  10-03-1933 Reason for consult: stroke code Requesting physician: Dr. Augusto Gamble _ _ _   _ __   _ __ _ _  __ __   _ __   __ _  History of Present Illness   88 yo man with hx HTN, CAD, anemia p/w acute onset L sided numbness, weakness, and incoordination since last night. LKW approx midnight. Patient was oriented to person, place, time, and age but became very confused in conversation and history was very difficult to ascertain. Initially he stated that his L side was numb in triage but told me repeatedly during the stroke code that the numbness was on the right side. After the CT scan he reported the numbness again to be on the left side. His perception of the laterality of his sensory deficit continued to change while being examined. He initially denied numbness of his face, only his arm and leg. He was markedly ataxic on L FNF and had no movement against gravity LLE (both new since yesterday). His pants were soaked in urine. NIHSS = 6.  CT head NAICP. TNK not administered 2/2 presentation outside the window. CTA showed no LVO therefore there was no indication for intervention. CTP neg for perfusion deficit.  Given inability to clarify history or subjective symptoms, initial report that his face was unaffected, e/o urinary incontinence, and profound ataxia and leg weakness that he initially reported to be contralateral to his sensory deficit he was taken to MRI for spinal cord imaging as well as brain. MRI brain showed acute R thalamic infarct. After MRI he seemed less confused and reported that the numbness was actually on the left and involved face, arm, and leg.  MRI brain wwo - acute R thalamic ischemic infarct MRI c spine wwo - multilevel cervical spondylosis, multilevel neuroforaminal stenosis 2/2 spurring most prominent (severe) left C3-4, no cord compression MRI t spine wwo  - mild DDD MRI l spine wwo - multilevel lumbar spondylosis with multilevel subarticular stenoses and moderate central canal stenosis at L3-4  All CNS imaging personally reviewed.   ROS   Per HPI: all other systems reviewed and are negative  Past History   I have reviewed the following:  Past Medical History:  Diagnosis Date   Anemia    Colon polyps    Coronary artery disease    Hematuria    History of hiatal hernia    Hypertension    Past Surgical History:  Procedure Laterality Date   COLONOSCOPY WITH ESOPHAGOGASTRODUODENOSCOPY (EGD)     COLONOSCOPY WITH PROPOFOL N/A 09/27/2014   Procedure: COLONOSCOPY WITH PROPOFOL;  Surgeon: Scot Jun, MD;  Location: [facility] ENDOSCOPY;  Service: Endoscopy;  Laterality: N/A;   JOINT REPLACEMENT     No family history on file. Social History   Socioeconomic History   Marital status: Married    Spouse name: Not on file   Number of children: Not on file   Years of education: Not on file   Highest education level: Not on file  Occupational History   Not on file  Tobacco Use   Smoking status: Never   Smokeless tobacco: Not on file  Substance and Sexual Activity   Alcohol use: No   Drug use: No   Sexual activity: Not on file  Other Topics Concern   Not on file  Social History Narrative   Not on file  Social Determinants of Health   Financial Resource Strain: Not on file  Food Insecurity: Not on file  Transportation Needs: Not on file  Physical Activity: Not on file  Stress: Not on file  Social Connections: Not on file   No Known Allergies  Medications   (Not in a hospital admission)     Current Facility-Administered Medications:    [START ON 11/26/2021]  stroke: early stages of recovery book, , Does not apply, Once, Lurline Del, MD   0.9 %  sodium chloride infusion, , Intravenous, Continuous, Lurline Del, MD, Last Rate: 50 mL/hr at 11/25/21 1753, New Bag at 11/25/21 1753   acetaminophen (TYLENOL)  tablet 650 mg, 650 mg, Oral, Q4H PRN **OR** acetaminophen (TYLENOL) 160 MG/5ML solution 650 mg, 650 mg, Per Tube, Q4H PRN **OR** acetaminophen (TYLENOL) suppository 650 mg, 650 mg, Rectal, Q4H PRN, Lurline Del, MD   [START ON 11/26/2021] aspirin chewable tablet 81 mg, 81 mg, Oral, Daily, Jefferson Fuel, MD   clopidogrel (PLAVIX) tablet 75 mg, 75 mg, Oral, Daily, Jefferson Fuel, MD   [START ON 11/26/2021] heparin injection 5,000 Units, 5,000 Units, Subcutaneous, Q8H, Skip Mayer A, MD   senna-docusate (Senokot-S) tablet 1 tablet, 1 tablet, Oral, QHS PRN, Lurline Del, MD  Current Outpatient Medications:    amLODipine (NORVASC) 10 MG tablet, Take 10 mg by mouth daily., Disp: , Rfl:    amoxicillin (AMOXIL) 500 MG tablet, Take 500 mg by mouth 3 (three) times daily. (For 7 days), Disp: , Rfl:    chlorhexidine (PERIDEX) 0.12 % solution, Use as directed 15 mLs in the mouth or throat 2 (two) times daily., Disp: , Rfl:    ibuprofen (ADVIL) 600 MG tablet, Take 600 mg by mouth every 6 (six) hours as needed for mild pain or moderate pain., Disp: , Rfl:    triamterene-hydrochlorothiazide (MAXZIDE-25) 37.5-25 MG per tablet, Take 1 tablet by mouth daily., Disp: , Rfl:   Vitals   Vitals:   11/25/21 1600 11/25/21 1630 11/25/21 1730 11/25/21 2005  BP: (!) 146/70 (!) 164/96 (!) 175/80 (!) 165/86  Pulse: 63 63 64 64  Resp: 15 (!) 8 15 18   Temp:    98.1 F (36.7 C)  TempSrc:    Oral  SpO2:    100%  Weight:      Height:         Body mass index is 24.4 kg/m.  Physical Exam   Physical Exam Gen: A&O x4, NAD HEENT: Atraumatic, normocephalic;mucous membranes moist; oropharynx clear, tongue without atrophy or fasciculations. Neck: Supple, trachea midline. Resp: CTAB, no w/r/r CV: RRR, no m/g/r; nml S1 and S2. 2+ symmetric peripheral pulses. Abd: soft/NT/ND; nabs x 4 quad Extrem: Nml bulk; no cyanosis, clubbing, or edema.  Neuro: *MS: A&O x4. Follows multi-step commands.   *Speech: fluid, mild dysarthria, able to name and repeat *CN:    I: Deferred   II,III: PERRLA, VFF by confrontation, optic discs unable to be visualized 2/2 pupillary constriction   III,IV,VI: EOMI w/o nystagmus, no ptosis   V: Sensation impaired to LT on L   VII: Eyelid closure was full.  Smile symmetric.   VIII: Hearing intact to voice   IX,X: Voice normal, palate elevates symmetrically    XI: SCM/trap 5/5 bilat   XII: Tongue protrudes midline, no atrophy or fasciculations  *Motor:   Normal bulk.  No tremor, rigidity or bradykinesia. Strength 5/5 throughout in BUE and RLE. No movement against gravity LLE. *Sensory: Impaired to  LT LUE and LLE. No double-simultaneous extinction.  *Coordination:  Marked ataxia on L FNF, no ataxia on R *Reflexes:  2+ and symmetric throughout without clonus; toes down-going bilat *Gait: deferred  NIHSS = 6 (3 LLE motor, 1 ataxia, 1 sensory, 1 dysarthria)  Premorbid mRS = 2   Labs   CBC:  Recent Labs  Lab 11/25/21 1435  WBC 4.6  NEUTROABS 3.0  HGB 12.1*  HCT 37.7*  MCV 85.1  PLT 161    Basic Metabolic Panel:  Lab Results  Component Value Date   NA 139 11/25/2021   K 3.6 11/25/2021   CO2 22 11/25/2021   GLUCOSE 97 11/25/2021   BUN 18 11/25/2021   CREATININE 0.92 11/25/2021   CALCIUM 8.6 (L) 11/25/2021   GFRNONAA >60 11/25/2021   GFRAA >60 12/23/2012   Lipid Panel: No results found for: "LDLCALC" HgbA1c: No results found for: "HGBA1C" Urine Drug Screen:     Component Value Date/Time   LABOPIA NONE DETECTED 11/25/2021 1755   COCAINSCRNUR NONE DETECTED 11/25/2021 1755   LABBENZ NONE DETECTED 11/25/2021 1755   AMPHETMU NONE DETECTED 11/25/2021 1755   THCU NONE DETECTED 11/25/2021 1755   LABBARB NONE DETECTED 11/25/2021 1755    Alcohol Level     Component Value Date/Time   ETH <10 11/25/2021 1435     Impression   88 yo man with hx HTN, CAD, anemia p/w acute onset L sided numbness, weakness, and incoordination since last  night 2/2 acute ischemic right thalamic infarct.  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 PRN labetalol or hydralazine if BP above these parameters. Avoid oral antihypertensives. - TTE  - Check A1c and LDL + add statin per guidelines - ASA 81mg  daily + plavix 75mg  daily x21 days f/b ASA 81mg  daily monotherapy after that - q4 hr neuro checks - STAT head CT for any change in neuro exam - Tele - PT/OT/SLP - Stroke education - Amb referral to neurology upon discharge  - EEG tomorrow given new urinary incontinence and transient confusion on arrival to ED  Will continue to follow  ______________________________________________________________________   Thank you for the opportunity to take part in the care of this patient. If you have any further questions, please contact the neurology consultation attending.  Signed,  [name], MD [facility] [phone]  If 7pm- 7am, please page neurology on call as listed in [place].  **Any copied and pasted documentation in this note was written by me in another application not billed for and pasted by me into this document.

## 2021-11-25 NOTE — Progress Notes (Signed)
CODE STROKE- PHARMACY COMMUNICATION   Time CODE STROKE called/page received:1213  Time response to CODE STROKE was made (in person or via phone): 1215  Time Stroke Kit retrieved from [place] (only if needed): N/A, outside of treatment window  Name of Provider/Nurse contacted:Colleen Quinn Axe  Past Medical History:  Diagnosis Date   Anemia    Colon polyps    Coronary artery disease    Hematuria    History of hiatal hernia    Hypertension    Prior to Admission medications   Medication Sig Start Date End Date Taking? Authorizing Provider  amLODipine (NORVASC) 10 MG tablet Take 10 mg by mouth daily.    [provider]  aspirin 81 MG tablet Take 81 mg by mouth daily.    [provider]  sildenafil (VIAGRA) 100 MG tablet Take 100 mg by mouth daily as needed for erectile dysfunction.    [provider]  triamterene-hydrochlorothiazide (MAXZIDE-25) 37.5-25 MG per tablet Take 1 tablet by mouth daily.    [provider]    Pearla Dubonnet ,PharmD Clinical Pharmacist  11/25/2021  12:21 PM

## 2021-11-25 NOTE — ED Notes (Signed)
Pt returned from MRI, labs obtained, and NIHSS / swallow screen done. Pt passed swallow screen, pt able to hold his left leg up. States does not feel any different from left to right, able to wiggle toes, just cannot move. Pt breathing e/u on room air. Family at bedside. He denies pain.

## 2021-11-25 NOTE — ED Triage Notes (Signed)
Pt here after feeling weak this morning and falling to the floor due to his left leg side being weak. Pt states LKW was 0400. Pt recently had oral surgery.

## 2021-11-26 ENCOUNTER — Inpatient Hospital Stay
Admit: 2021-11-26 | Discharge: 2021-11-26 | Disposition: A | Discharge status: Home / Self Care | Payer: Medicare HMO | Attending: Internal Medicine | Admitting: Internal Medicine

## 2021-11-26 DIAGNOSIS — M5136 Other intervertebral disc degeneration, lumbar region: Secondary | ICD-10-CM

## 2021-11-26 DIAGNOSIS — M5030 Other cervical disc degeneration, unspecified cervical region: Secondary | ICD-10-CM

## 2021-11-26 DIAGNOSIS — R4182 Altered mental status, unspecified: Secondary | ICD-10-CM

## 2021-11-26 DIAGNOSIS — R270 Ataxia, unspecified: Secondary | ICD-10-CM | POA: Diagnosis not present

## 2021-11-26 DIAGNOSIS — I10 Essential (primary) hypertension: Secondary | ICD-10-CM

## 2021-11-26 DIAGNOSIS — R471 Dysarthria and anarthria: Secondary | ICD-10-CM

## 2021-11-26 DIAGNOSIS — R29898 Other symptoms and signs involving the musculoskeletal system: Secondary | ICD-10-CM

## 2021-11-26 DIAGNOSIS — I2510 Atherosclerotic heart disease of native coronary artery without angina pectoris: Secondary | ICD-10-CM

## 2021-11-26 DIAGNOSIS — I6381 Other cerebral infarction due to occlusion or stenosis of small artery: Secondary | ICD-10-CM | POA: Diagnosis not present

## 2021-11-26 DIAGNOSIS — I639 Cerebral infarction, unspecified: Secondary | ICD-10-CM | POA: Diagnosis not present

## 2021-11-26 DIAGNOSIS — R200 Anesthesia of skin: Secondary | ICD-10-CM

## 2021-11-26 LAB — ECHOCARDIOGRAM COMPLETE
AR max vel: 1.95 cm2
AV Area VTI: 1.96 cm2
AV Area mean vel: 1.89 cm2
AV Mean grad: 5 mmHg
AV Peak grad: 9.5 mmHg
Ao pk vel: 1.54 m/s
Area-P 1/2: 3.19 cm2
Height: 74 in
S' Lateral: 2.2 cm
Weight: 3040.58 oz

## 2021-11-26 LAB — HEMOGLOBIN A1C
Hgb A1c MFr Bld: 5.7 % — ABNORMAL HIGH (ref 4.8–5.6)
Mean Plasma Glucose: 116.89 mg/dL

## 2021-11-26 LAB — LIPID PANEL
Cholesterol: 146 mg/dL (ref 0–200)
HDL: 31 mg/dL — ABNORMAL LOW (ref >40)
LDL Cholesterol: 98 mg/dL (ref 0–99)
Total CHOL/HDL Ratio: 4.7 RATIO
Triglycerides: 84 mg/dL (ref <150)
VLDL: 17 mg/dL (ref 0–40)

## 2021-11-26 LAB — VITAMIN B12: Vitamin B-12: 309 pg/mL (ref 180–914)

## 2021-11-26 MED ORDER — CLOPIDOGREL BISULFATE 75 MG PO TABS
75.0000 mg | ORAL_TABLET | Freq: Every day | ORAL | Status: DC
  Administered 2021-11-26 – 2021-12-02 (×8): 75 mg via ORAL
  Filled 2021-11-26 (×8): qty 1

## 2021-11-26 MED ORDER — ATORVASTATIN CALCIUM 20 MG PO TABS
40.0000 mg | ORAL_TABLET | Freq: Every evening | ORAL | Status: DC
  Administered 2021-11-26 – 2021-12-01 (×6): 40 mg via ORAL
  Filled 2021-11-26 (×6): qty 2

## 2021-11-26 MED ORDER — HYDRALAZINE HCL 20 MG/ML IJ SOLN: 10.0000 mg | Freq: Four times a day (QID) | INTRAMUSCULAR | Status: DC | PRN

## 2021-11-26 NOTE — Progress Notes (Signed)
Neurology Progress Note  Patient ID: 88 yo man with hx HTN, CAD, anemia p/w acute onset L sided numbness, weakness, and ataxia 2/2 acute ischemic right thalamic infarct. He was outside the window for TNK.  Subjective: L sided numbness and LLE weakness both significantly improved from yesterday, LUE ataxia mildly improved. No new neurologic complaints today.  Data:  CT head NAICP CTA showed no LVO or hemodynamically significant stenosis CTP neg for perfusion deficit MRI brain wwo - acute R thalamic ischemic infarct MRI c spine wwo - multilevel cervical spondylosis, multilevel neuroforaminal stenosis 2/2 spurring most prominent (severe) left C3-4, no cord compression MRI t spine wwo - mild DDD MRI l spine wwo - multilevel lumbar spondylosis with multilevel subarticular stenoses and moderate central canal stenosis at L3-4   All CNS imaging personally reviewed.  TTE - LVEF 70 to 75%, LV with hyperdynamic function, no intracardiac clot  rEEG - normal awake and asleep  Stroke Labs     Component Value Date/Time   CHOL 146 11/26/2021 0459   TRIG 84 11/26/2021 0459   HDL 31 (L) 11/26/2021 0459   CHOLHDL 4.7 11/26/2021 0459   VLDL 17 11/26/2021 0459   LDLCALC 98 11/26/2021 0459    Lab Results  Component Value Date/Time   HGBA1C 5.7 (H) 11/25/2021 02:35 PM     Exam: Vitals:   11/26/21 2352 11/27/21 0808  BP: (!) 173/85 (!) 152/83  Pulse: 62 61  Resp: 18 18  Temp:  98.1 F (36.7 C)  SpO2: 100% 100%   Physical Exam Gen: A&O x4, NAD HEENT: Atraumatic, normocephalic;mucous membranes moist; oropharynx clear, tongue without atrophy or fasciculations. Neck: Supple, trachea midline. Resp: CTAB, no w/r/r CV: RRR, no m/g/r; nml S1 and S2. 2+ symmetric peripheral pulses. Abd: soft/NT/ND; nabs x 4 quad Extrem: Nml bulk; no cyanosis, clubbing, or edema.   Neuro: *MS: A&O x4. Follows multi-step commands.  *Speech: fluid, minimal dysarthria, able to name and repeat *CN:    I:  Deferred   II,III: PERRLA, VFF by confrontation, optic discs unable to be visualized 2/2 pupillary constriction   III,IV,VI: EOMI w/o nystagmus, no ptosis   V: Sensation mildly impaired to LT on L   VII: Eyelid closure was full.  Smile symmetric.   VIII: Hearing intact to voice   IX,X: Voice normal, palate elevates symmetrically    XI: SCM/trap 5/5 bilat   XII: Tongue protrudes midline, no atrophy or fasciculations  *Motor:   Normal bulk.  No tremor, rigidity or bradykinesia. Strength 5/5 throughout in BUE and RLE. LLE strength improved, now anti-gravity with drift but not to bed. *Sensory: Impaired to LT LUE and LLE (improved). No double-simultaneous extinction.  *Coordination:  Moderate ataxia on L FNF, no ataxia on R *Reflexes:  2+ and symmetric throughout without clonus; toes down-going bilat *Gait: deferred   NIHSS = 4 (1 LLE motor, 1 ataxia, 1 sensory, 1 dysarthria) Decreased from 6 on admission   Premorbid mRS = 2  A/P: 88 yo man with hx HTN, CAD, anemia p/w L thalamic ataxia syndrome 2/2 acute ischemic right thalamic infarct. L sided numbness and LLE weakness both significantly improved from yesterday, LUE ataxia mildly improved. Stroke workup is now completed. Etiology favored to be small vessel.   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 EEG was WNL. No indication to start AED at this time but would consider it if he has a 2nd event c/f seizure.  # Severe DDD cervical and lumbar spine MRI c spine wwo - multilevel cervical spondylosis, multilevel neuroforaminal stenosis 2/2 spurring most prominent  (severe) left C3-4, no cord compression MRI t spine wwo - mild DDD MRI l spine wwo - multilevel lumbar spondylosis with multilevel subarticular stenoses and moderate central canal stenosis at L3-4 Above findings do not explain his LLE weakness. Above can be monitored by the outpatient neurologist I will refer him to for stroke f/u  Neurology to sign off, but please re-engage if new neurologic concerns arise.  [name], MD [facility] [phone]  If 7pm- 7am, please page neurology on call as listed in [place] [facility] [place].

## 2021-11-26 NOTE — ED Notes (Signed)
EEG in process at this time.

## 2021-11-26 NOTE — Evaluation (Signed)
Occupational Therapy Evaluation Patient Details Name: Kevin [name] MRN: [id number] DOB: 1934-02-02 Today's Date: 11/26/2021   History of Present Illness Pt is an 88 year old male admitted with acute R thalamic ischemic infarct; PMH significant for HTN, CAD, anemia   Clinical Impression   Chart reviewed, pt greeted in room agreeable to OT evaluation. Co tx completed with PT on this date. Pt is oriented to self, place, grossly to situation. Pt is not oriented to date. Poor safety awareness with impaired processing noted throughout. Daughter reports this is not baseline. PTA pt performed all ADL/IADL with MOD I-I. Pt presents with impairments in LUE function, balance, strength, endurance, activity tolerance, vision, cognition all affecting safe and optimal ADL completion. MIN -MOD A required for bed mobility, MOD-MAX A +2 required for STS with significant L lateral lean noted throughout. Posterior and L lateral lean also noted during seated static/dynamic tasks. MAX A required for dressing tasks. Pt is performing below PLOF, at this time, recommend discharge to intensive rehab to facilitate return to PLOF and to address functional deficits. Education provide dto pt and daughter, they are in agreement and state pt daughter and 2 adult grandsons live with pt and family will assist at discharge.       Recommendations for follow up therapy are one component of a multi-disciplinary discharge planning process, led by the attending physician.  Recommendations may be updated based on patient status, additional functional criteria and insurance authorization.   Follow Up Recommendations  Acute inpatient rehab (3hours/day)    Assistance Recommended at Discharge Frequent or constant Supervision/Assistance  Patient can return home with the following A lot of help with walking and/or transfers;A lot of help with bathing/dressing/bathroom    Functional Status Assessment  Patient has had a recent decline in  their functional status and demonstrates the ability to make significant improvements in function in a reasonable and predictable amount of time.  Equipment Recommendations  BSC/3in1;Tub/shower seat    Recommendations for Other Services Rehab consult     Precautions / Restrictions Precautions Precautions: Fall Restrictions Weight Bearing Restrictions: No      Mobility Bed Mobility Overal bed mobility: Needs Assistance Bed Mobility: Supine to Sit, Sit to Supine     Supine to sit: Mod assist Sit to supine: Min assist   General bed mobility comments: for LLE management    Transfers Overall transfer level: Needs assistance Equipment used: Rolling walker (2 wheels) Transfers: Sit to/from Stand Sit to Stand: Mod assist, Max assist, +2 physical assistance           General transfer comment: frequent vcs for technique      Balance Overall balance assessment: Needs assistance Sitting-balance support: Feet supported Sitting balance-Leahy Scale: Fair   Postural control: Posterior lean Standing balance support: Bilateral upper extremity supported, During functional activity, Reliant on assistive device for balance Standing balance-Leahy Scale: Poor Standing balance comment: left lateral lean with step by step vcs for technique and safety                           ADL either performed or assessed with clinical judgement   ADL Overall ADL's : Needs assistance/impaired     Grooming: Wash/dry hands;Wash/dry face;Moderate assistance           Upper Body Dressing : Moderate assistance;Sitting;Cueing for sequencing   Lower Body Dressing: Maximal assistance;Sit to/from stand;Cueing for sequencing       Toileting- Clothing Manipulation and Hygiene:  Maximal assistance;Sit to/from stand;Cueing for sequencing               Vision   Vision Assessment?: Yes Eye Alignment: Within Functional Limits Ocular Range of Motion: Impaired-to be further tested in  functional context;Restricted on the left (nystagmus noted with tracking to the L- pt reports no functional change in vision however diffiuclty reading clock on the wall; OT will continue to assess) Tracking/Visual Pursuits: Decreased smoothness of horizontal tracking;Impaired - to be further tested in functional context (nystagmus noted with tracking to the L- pt reports no functional change in vision however diffiuclty reading clock on the wall; OT will continue to assess)     Perception     Praxis Praxis Praxis: Impaired Praxis Impairment Details: Motor planning    Pertinent Vitals/Pain Pain Assessment Pain Assessment: No/denies pain     Hand Dominance Right   Extremity/Trunk Assessment Upper Extremity Assessment Upper Extremity Assessment: LUE deficits/detail;RUE deficits/detail RUE Sensation: WNL RUE Coordination: WNL LUE Deficits / Details: AROM: shoulder flexion: to approx 80 degrees, elbow flexion/extension approx 3/4 full AROM, wrist approx 3/4 full AROM PROM appears WFL; Generalized weakness noted throughout; no tone noted at this time; sensation/proprioception appear WFL, will continue to assess LUE Sensation: WNL LUE Coordination: decreased fine motor;decreased gross motor   Lower Extremity Assessment Lower Extremity Assessment: LLE deficits/detail;RLE deficits/detail RLE Sensation: WNL RLE Coordination: WNL LLE Sensation: WNL LLE Coordination: decreased gross motor   Cervical / Trunk Assessment Cervical / Trunk Assessment: Normal   Communication Communication Communication: No difficulties   Cognition Arousal/Alertness: Awake/alert Behavior During Therapy: WFL for tasks assessed/performed Overall Cognitive Status: Impaired/Different from baseline Area of Impairment: Orientation, Attention, Memory, Following commands, Safety/judgement, Awareness, Problem solving                 Orientation Level: Disoriented to, Time Current Attention Level: Sustained    Following Commands: Follows one step commands with increased time Safety/Judgement: Decreased awareness of safety, Decreased awareness of deficits Awareness: Intellectual Problem Solving: Requires verbal cues, Requires tactile cues, Difficulty sequencing, Slow processing       General Comments  vital signs appear stable throughout    Exercises     Shoulder Instructions      Home Living Family/patient expects to be discharged to:: Private residence Living Arrangements: Children (adult grand children) Available Help at Discharge: Family;Available 24 hours/day Type of Home: House Home Access: Level entry (side door)     Home Layout: One level     Bathroom Shower/Tub: Producer, television/film/video: Standard                Prior Functioning/Environment Prior Level of Function : Independent/Modified Independent             Mobility Comments: prn cane use community distances; one fall morning of admission ADLs Comments: MOD I-I in ADL/IADL, cooks, cleans, drives, grocery shops, mows his 2 acre lawn        OT Problem List: Decreased strength;Decreased range of motion;Decreased activity tolerance;Impaired balance (sitting and/or standing);Decreased cognition;Impaired vision/perception;Decreased knowledge of use of DME or AE      OT Treatment/Interventions: Self-care/ADL training;Visual/perceptual remediation/compensation;Patient/family education;Therapeutic exercise;Balance training;Neuromuscular education;Energy conservation;Therapeutic activities;DME and/or AE instruction;Cognitive remediation/compensation    OT Goals(Current goals can be found in the care plan section) Acute Rehab OT Goals Patient Stated Goal: to get back to PLOF OT Goal Formulation: With patient/family Time For Goal Achievement: 12/10/21 Potential to Achieve Goals: Good ADL Goals Pt Will Perform Grooming: sitting;standing;with modified independence  Pt Will Perform Lower Body Dressing: with  modified independence;sit to/from stand Pt Will Transfer to Toilet: with modified independence Pt Will Perform Toileting - Clothing Manipulation and hygiene: with modified independence;sit to/from stand Pt/caregiver will Perform Home Exercise Program: Increased strength;Left upper extremity;With written HEP provided  OT Frequency:      Co-evaluation PT/OT/SLP Co-Evaluation/Treatment: Yes Reason for Co-Treatment: For patient/therapist safety;To address functional/ADL transfers   OT goals addressed during session: ADL's and self-care      AM-PAC OT "6 Clicks" Daily Activity     Outcome Measure Help from another person eating meals?: A Little Help from another person taking care of personal grooming?: A Little Help from another person toileting, which includes using toliet, bedpan, or urinal?: A Lot Help from another person bathing (including washing, rinsing, drying)?: A Lot Help from another person to put on and taking off regular upper body clothing?: A Lot Help from another person to put on and taking off regular lower body clothing?: A Lot 6 Click Score: 14   End of Session Equipment Utilized During Treatment: Gait belt;Rolling walker (2 wheels) Nurse Communication: Mobility status  Activity Tolerance: Patient tolerated treatment well Patient left: with call bell/phone within reach;with bed alarm set;in bed;with family/visitor present  OT Visit Diagnosis: History of falling (Z91.81);Unsteadiness on feet (R26.81);Other symptoms and signs involving the nervous system (R29.898);Hemiplegia and hemiparesis Hemiplegia - Right/Left: Left Hemiplegia - dominant/non-dominant: Non-Dominant Hemiplegia - caused by: Cerebral infarction                Time: 5409-8119 OT Time Calculation (min): 32 min Charges:  OT General Charges $OT Visit: 1 Visit OT Evaluation $OT Eval Moderate Complexity: 1 Mod OT Treatments $Self Care/Home Management : 8-22 mins  Oleta Mouse, OTD OTR/L  11/26/21,  1:15 PM

## 2021-11-26 NOTE — Evaluation (Signed)
Physical Therapy Evaluation Patient Details Name: Kevin [name] MRN: [id number] DOB: [birth year] Today's Date: 11/26/2021  History of Present Illness  Pt is an 88 year old male admitted with acute R thalamic ischemic infarct; PMH significant for HTN, CAD, anemia  Clinical Impression  Pt is a pleasant 88 year old male who was admitted for R CVA. Pt performs bed mobility with min assist and transfers with mod/max assist +2. Heavy L lateral leaning noted in standing with unable to progress to gait. Slight nystagmus noted in L eye with tracking. Decreased strength in L LE < L UE. Pt demonstrates deficits with strength/coordination/cognition. Pt with excellent family support and was very indep prior to admission. Pt appears motivated and engaged to improve on deficits and is very pleasant. Would benefit from skilled PT to address above deficits and promote optimal return to PLOF. Currently recommend CIR for further recovery.      Recommendations for follow up therapy are one component of a multi-disciplinary discharge planning process, led by the attending physician.  Recommendations may be updated based on patient status, additional functional criteria and insurance authorization.  Follow Up Recommendations Acute inpatient rehab (3hours/day)      Assistance Recommended at Discharge Intermittent Supervision/Assistance  Patient can return home with the following  Two people to help with walking and/or transfers;Two people to help with bathing/dressing/bathroom;Assist for transportation;Help with stairs or ramp for entrance    Equipment Recommendations  (TBD)  Recommendations for Other Services       Functional Status Assessment Patient has had a recent decline in their functional status and demonstrates the ability to make significant improvements in function in a reasonable and predictable amount of time.     Precautions / Restrictions Precautions Precautions: Fall Restrictions Weight  Bearing Restrictions: No      Mobility  Bed Mobility Overal bed mobility: Needs Assistance Bed Mobility: Sit to Supine       Sit to supine: Min assist   General bed mobility comments: needs cues for sequencing and LE management.    Transfers Overall transfer level: Needs assistance Equipment used: Rolling walker (2 wheels) Transfers: Sit to/from Stand Sit to Stand: Max assist, Mod assist, +2 safety/equipment           General transfer comment: heavy L lateral lean during standing. Needs B foot placement prior to standing attempts. RW used. Has difficulty with pre gait activities in order to progress mobility. Not safe to further ambulate at thist ime    Ambulation/Gait               General Gait Details: unable with +2 assist  Stairs            Wheelchair Mobility    Modified Rankin (Stroke Patients Only)       Balance Overall balance assessment: Needs assistance Sitting-balance support: Feet supported Sitting balance-Leahy Scale: Fair   Postural control: Left lateral lean, Right lateral lean Standing balance support: Bilateral upper extremity supported, During functional activity, Reliant on assistive device for balance Standing balance-Leahy Scale: Poor                               Pertinent Vitals/Pain Pain Assessment Pain Assessment: No/denies pain    Home Living Family/patient expects to be discharged to:: Private residence Living Arrangements: Children (and adult grandchildren) Available Help at Discharge: Family;Available 24 hours/day Type of Home: House Home Access: Level entry  Home Layout: One level Home Equipment: Cane - single point      Prior Function Prior Level of Function : Independent/Modified Independent             Mobility Comments: prn cane use community distances; one fall morning of admission ADLs Comments: MOD I-I in ADL/IADL, cooks, cleans, drives, grocery shops, 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, [employer] Dominance   Dominant Hand: Right    Extremity/Trunk Assessment   Upper Extremity Assessment Upper Extremity Assessment: Defer to OT evaluation RUE Sensation: WNL RUE Coordination: WNL LUE Deficits / Details: AROM: shoulder flexion: to approx 80 degrees, elbow flexion/extension approx 3/4 full AROM, wrist approx 3/4 full AROM PROM appears WFL; no tone noted at this time; sensation/proprioception appear WFL, will continue to assess LUE Sensation: WNL LUE Coordination: decreased fine motor;decreased gross motor    Lower Extremity Assessment Lower Extremity Assessment: LLE deficits/detail RLE Sensation: WNL RLE Coordination: WNL LLE Deficits / Details: 3+/5 hip flexor and 3/5 knee extension/DF. LLE Sensation: WNL LLE Coordination: decreased gross motor    Cervical / Trunk Assessment Cervical / Trunk Assessment: Normal  Communication   Communication: No difficulties  Cognition Arousal/Alertness: Awake/alert Behavior During Therapy: WFL for tasks assessed/performed Overall Cognitive Status: Impaired/Different from baseline                                 General Comments: decreased safety awareness        General Comments General comments (skin integrity, edema, etc.): vital signs appear stable throughout    Exercises     Assessment/Plan    PT Assessment Patient needs continued PT services  PT Problem List Decreased strength;Decreased balance;Decreased mobility;Decreased cognition;Decreased safety awareness       PT Treatment Interventions DME instruction;Gait training;Therapeutic exercise;Balance training;Therapeutic activities    PT Goals (Current goals can be found in the Care Plan section)  Acute Rehab PT Goals Patient Stated Goal: to go back to mowing PT Goal Formulation: With patient Time For Goal Achievement: 12/10/21 Potential to Achieve Goals: Good    Frequency 7X/week     Co-evaluation PT/OT/SLP Co-Evaluation/Treatment:  Yes Reason for Co-Treatment: For patient/therapist safety;To address functional/ADL transfers PT goals addressed during session: Mobility/safety with mobility OT goals addressed during session: ADL's and self-care       AM-PAC PT "6 Clicks" Mobility  Outcome Measure Help needed turning from your back to your side while in a flat bed without using bedrails?: A Little Help needed moving from lying on your back to sitting on the side of a flat bed without using bedrails?: A Lot Help needed moving to and from a bed to a chair (including a wheelchair)?: A Lot Help needed standing up from a chair using your arms (e.g., wheelchair or bedside chair)?: A Lot Help needed to walk in hospital room?: Total Help needed climbing 3-5 steps with a railing? : Total 6 Click Score: 11    End of Session Equipment Utilized During Treatment: Gait belt Activity Tolerance: Patient tolerated treatment well Patient left: in bed;with bed alarm set;with family/visitor present Nurse Communication: Mobility status PT Visit Diagnosis: Unsteadiness on feet (R26.81);Muscle weakness (generalized) (M62.81);Difficulty in walking, not elsewhere classified (R26.2);Hemiplegia and hemiparesis Hemiplegia - Right/Left: Left Hemiplegia - dominant/non-dominant: Non-dominant Hemiplegia - caused by: Cerebral infarction    Time: 6063-0160 PT Time Calculation (min) (ACUTE ONLY): 23 min   Charges:   PT Evaluation $PT Eval Low Complexity:  1 Low          Elizabeth Palau, [place], DPT, GCS [phone]   Belen Pesch 11/26/2021, 2:07 PM

## 2021-11-26 NOTE — Progress Notes (Signed)
PROGRESS NOTE    Kevin [name]  CZY:[id number] DOB: 1933/08/20 DOA: 11/25/2021 PCP: Mick Sell, MD    Brief Narrative:  Kevin [name] is a 88 y.o. male with medical history significant of  HTN, CAD non obstructive, Anemia  who presents to ED with complaint of left sided weakness . Per patient this am on  waking around 1 am he felt he was not able to move his arm or left leg. He states he attempted to ambulate and fell.  He notes no associated HA, dizzy , chest pain , slurred speech, confusion, difficulty swallowing or prior symptoms like this in the past. ON further ros no fever/ chills/ n/v/cough or sob.    ED Course:  Vitals: Afeb, hr 68, rr 18, sat 100% on ra  CODE CVA CTH:. No evidence of acute intracranial abnormality. CTA neck:1. No emergent large vessel occlusion or proximal hemodynamically significant stenosis. 2. No evidence of core infarct or penumbra on CT perfusion.   MRI Acute infarct right lateral thalamus    MRI spine Lumbar spondylosis. Multilevel spinal and subarticular stenosis  Mild thoracic degenerative change most prominent at T10-11. No significant stenosis     9/28 feels his left legs again. No sob , cp, dizziness, new numbness.   Consultants:  neurology  Procedures:   Antimicrobials:      Subjective: As above  Objective: Vitals:   11/26/21 1300 11/26/21 1315 11/26/21 1434 11/26/21 1435  BP:    (!) 180/76  Pulse: 68 72  76  Resp: 15 12  17   Temp:   98.4 F (36.9 C)   TempSrc:   Oral   SpO2: 100% 100%  93%  Weight:      Height:       No intake or output data in the 24 hours ending 11/26/21 1652 Filed Weights   11/25/21 1219  Weight: 86.2 kg    Examination:  Calm, NAD Cta no w/r Reg s1/s2 no gallop Soft benign +bs No edema Awake and alert.strenght 5/5 x3, 4/5 LLE. Mood and affect appropriate in current setting     Data Reviewed: I have personally reviewed following labs and imaging studies  CBC: Recent Labs   Lab 11/25/21 1435  WBC 4.6  NEUTROABS 3.0  HGB 12.1*  HCT 37.7*  MCV 85.1  PLT 265   Basic Metabolic Panel: Recent Labs  Lab 11/25/21 1435  NA 139  K 3.6  CL 107  CO2 22  GLUCOSE 97  BUN 18  CREATININE 0.92  CALCIUM 8.6*   GFR: Estimated Creatinine Clearance: 67 mL/min (by C-G formula based on SCr of 0.92 mg/dL). Liver Function Tests: Recent Labs  Lab 11/25/21 1435  AST 20  ALT 18  ALKPHOS 60  BILITOT 0.5  PROT 7.2  ALBUMIN 3.4*   No results for input(s): "LIPASE", "AMYLASE" in the last 168 hours. No results for input(s): "AMMONIA" in the last 168 hours. Coagulation Profile: Recent Labs  Lab 11/25/21 1435  INR 1.1   Cardiac Enzymes: No results for input(s): "CKTOTAL", "CKMB", "CKMBINDEX", "TROPONINI" in the last 168 hours. BNP (last 3 results) No results for input(s): "PROBNP" in the last 8760 hours. HbA1C: Recent Labs    11/25/21 1435  HGBA1C 5.7*   CBG: Recent Labs  Lab 11/25/21 1210  GLUCAP 99   Lipid Profile: Recent Labs    11/26/21 0459  CHOL 146  HDL 31*  LDLCALC 98  TRIG 84  CHOLHDL 4.7   Thyroid Function Tests: No results  for input(s): "TSH", "T4TOTAL", "FREET4", "T3FREE", "THYROIDAB" in the last 72 hours. Anemia Panel: Recent Labs    11/25/21 1435 11/25/21 2015 2021-12-13 0459  VITAMINB12  --   --  309  FOLATE  --  17.7  --   FERRITIN 35  --   --   TIBC 319  --   --   IRON 108  --   --   RETICCTPCT 1.2  --   --    Sepsis Labs: No results for input(s): "PROCALCITON", "LATICACIDVEN" in the last 168 hours.  Recent Results (from the past 240 hour(s))  Resp Panel by RT-PCR (Flu A&B, Covid) Anterior Nasal Swab     Status: None   Collection Time: 11/25/21  2:35 PM   Specimen: Anterior Nasal Swab  Result Value Ref Range Status   SARS Coronavirus 2 by RT PCR NEGATIVE NEGATIVE Final    Comment: (NOTE) SARS-CoV-2 target nucleic acids are NOT DETECTED.  The SARS-CoV-2 RNA is generally detectable in upper  respiratory specimens during the acute phase of infection. The lowest concentration of SARS-CoV-2 viral copies this assay can detect is 138 copies/mL. A negative result does not preclude SARS-Cov-2 infection and should not be used as the sole basis for treatment or other patient management decisions. A negative result may occur with  improper specimen collection/handling, submission of specimen other than nasopharyngeal swab, presence of viral mutation(s) within the areas targeted by this assay, and inadequate number of viral copies(<138 copies/mL). A negative result must be combined with clinical observations, patient history, and epidemiological information. The expected result is Negative.  Fact Sheet for Patients:  EntrepreneurPulse.com.au  Fact Sheet for Healthcare Providers:  IncredibleEmployment.be  This test is no t yet approved or cleared by the [place] FDA and  has been authorized for detection and/or diagnosis of SARS-CoV-2 by FDA under an Emergency Use Authorization (EUA). This EUA will remain  in effect (meaning this test can be used) for the duration of the COVID-19 declaration under Section 564(b)(1) of the Act, 21 U.S.C.section 360bbb-3(b)(1), unless the authorization is terminated  or revoked sooner.       Influenza A by PCR NEGATIVE NEGATIVE Final   Influenza B by PCR NEGATIVE NEGATIVE Final    Comment: (NOTE) The Xpert Xpress SARS-CoV-2/FLU/RSV plus assay is intended as an aid in the diagnosis of influenza from Nasopharyngeal swab specimens and should not be used as a sole basis for treatment. Nasal washings and aspirates are unacceptable for Xpert Xpress SARS-CoV-2/FLU/RSV testing.  Fact Sheet for Patients: EntrepreneurPulse.com.au  Fact Sheet for Healthcare Providers: IncredibleEmployment.be  This test is not yet approved or cleared by the [place] FDA and has been  authorized for detection and/or diagnosis of SARS-CoV-2 by FDA under an Emergency Use Authorization (EUA). This EUA will remain in effect (meaning this test can be used) for the duration of the COVID-19 declaration under Section 564(b)(1) of the Act, 21 U.S.C. section 360bbb-3(b)(1), unless the authorization is terminated or revoked.  Performed at [facility], [address] [place], [place] [address]          Radiology Studies: EEG adult  Result Date: December 13, 2021 Derek Jack, MD     December 13, 2021  3:48 PM Routine EEG Report Kevin [name] is a 88 y.o. male with a history of altered mental status who is undergoing an EEG to evaluate for seizures. Report: This EEG was acquired with electrodes placed according to the International 10-20 electrode system (including Fp1, Fp2, F3, F4, C3,  C4, P3, P4, O1, O2, T3, T4, T5, T6, A1, A2, Fz, Cz, Pz). The following electrodes were missing or displaced: none. The occipital dominant rhythm was 9 Hz. This activity is reactive to stimulation. Drowsiness was manifested by background fragmentation; deeper stages of sleep were identified by K complexes and sleep spindles. There was no focal slowing. There were no interictal epileptiform discharges. There were no electrographic seizures identified. Photic stimulation and hyperventilation were not performed. Impression: This EEG was obtained while awake and asleep and is normal.   Clinical Correlation: Normal EEGs, however, do not rule out epilepsy. Bing Neighbors, MD [facility] [phone] If 7pm- 7am, please page neurology on call as listed in [facility].   MR CERVICAL SPINE W WO CONTRAST  Result Date: 11/25/2021 CLINICAL DATA:  Leg weakness, ataxia EXAM: MRI CERVICAL SPINE WITHOUT AND WITH CONTRAST TECHNIQUE: Multiplanar and multiecho pulse sequences of the cervical spine, to include the craniocervical junction and cervicothoracic junction, were obtained without and with intravenous contrast.  CONTRAST:  80 mL Vueway IV COMPARISON:  None Available. FINDINGS: Alignment: 4.4 mm anterolisthesis C3-4. 4.7 mm anterolisthesis C4-5. 3.7 mm retrolisthesis C6-7 Vertebrae: Negative for fracture or mass. Cord: Cord evaluation limited by mild motion. No cord lesion identified. Posterior Fossa, vertebral arteries, paraspinal tissues: Negative for paraspinous mass or fluid collection Disc levels: C2-3: Disc degeneration and spurring. Bilateral facet hypertrophy. Right foramen patent. Mild to moderate left foraminal stenosis. C3-4: Anterolisthesis with asymmetric facet degeneration on the left. Right foramen patent. Severe left foraminal stenosis. Central canal patent C4-5: Anterolisthesis. Disc and facet degeneration. Moderate left foraminal narrowing. Right foramen patent C5-6: Disc degeneration with diffuse endplate spurring. Cord flattening with borderline central canal stenosis. Mild foraminal narrowing bilaterally C6-7: Disc degeneration with diffuse uncinate spurring. Moderate foraminal stenosis bilaterally C7-T1: Disc and facet degeneration. Mild to moderate foraminal stenosis bilaterally. IMPRESSION: 1. Multilevel cervical spondylosis. Multilevel foraminal stenosis due to spurring as described above. 2. Motion degraded study. No cord lesion identified. Electronically Signed   By: Marlan Palau M.D.   On: 11/25/2021 17:54   MR THORACIC SPINE W WO CONTRAST  Result Date: 11/25/2021 CLINICAL DATA:  Leg weakness and ataxia.  Left-sided numbness EXAM: MRI THORACIC WITHOUT AND WITH CONTRAST TECHNIQUE: Multiplanar and multiecho pulse sequences of the thoracic spine were obtained without and with intravenous contrast. CONTRAST:  8 mL Vueway IV COMPARISON:  None Available. FINDINGS: Alignment:  Normal Vertebrae: Negative for fracture or mass. Schmorl's node superior endplate of T11 with edema and enhancement. Cord:  Normal signal and morphology Paraspinal and other soft tissues: Bilateral renal cysts. Largest cyst  on the left 5.7 cm. Disc levels: Mild thoracic disc degeneration with disc space narrowing. Disc and facet degeneration at T11-12. No significant spinal stenosis. IMPRESSION: Mild thoracic degenerative change most prominent at T10-11. No significant stenosis Negative for thoracic fracture or mass lesion.  None no Electronically Signed   By: Marlan Palau M.D.   On: 11/25/2021 14:39   MR Lumbar Spine W Wo Contrast  Result Date: 11/25/2021 CLINICAL DATA:  Low back pain. Rule out cauda equina syndrome. Leg weakness, ataxia. Numbness on left EXAM: MRI LUMBAR SPINE WITHOUT AND WITH CONTRAST TECHNIQUE: Multiplanar and multiecho pulse sequences of the lumbar spine were obtained without and with intravenous contrast. CONTRAST:  8 mL Vueway IV COMPARISON:  None Available. FINDINGS: Segmentation:  5 lumbar vertebra.  Lowest disc space L5-S1 Alignment:  Mild anterolisthesis L4-5 Vertebrae:  Negative for fracture or mass. Schmorl's node with bone marrow edema and  enhancement in the superior endplate of T12. Disc degeneration with discogenic sclerosis and edema at L2-3 eccentric to the left. Hemangioma L4 vertebral body. Conus medullaris and cauda equina: Conus extends to the L1-2 level. Conus and cauda equina appear normal. Paraspinal and other soft tissues: Bilateral renal cysts. Largest cyst on the right measures 4 cm. Largest cyst on the left 5.7 cm. No paraspinous adenopathy. Disc levels: L1-2: Mild disc bulging and facet degeneration. No significant stenosis. L2-3: Disc degeneration with discogenic changes. Disc degeneration and spurring more prominent on the left than the right. Moderate left subarticular and foraminal stenosis. Mild right subarticular stenosis. Borderline spinal stenosis L3-4: Advanced disc degeneration with disc space narrowing and diffuse endplate spurring. Moderate facet hypertrophy. Moderate central canal stenosis. Moderate subarticular and foraminal stenosis bilaterally due to spurring L4-5:  Mild anterolisthesis. Disc degeneration with diffuse disc bulging. Bilateral facet hypertrophy. Mild spinal stenosis. Moderate subarticular stenosis bilaterally. L5-S1: Disc and facet degeneration.  No significant stenosis. IMPRESSION: Lumbar spondylosis. Multilevel spinal and subarticular stenosis as above Negative for fracture Electronically Signed   By: Marlan Palau M.D.   On: 11/25/2021 14:35   MR BRAIN W WO CONTRAST  Result Date: 11/25/2021 CLINICAL DATA:  TIA. Acute neuro deficit. Ataxia. Left-sided numbness. EXAM: MRI HEAD WITHOUT AND WITH CONTRAST TECHNIQUE: Multiplanar, multiecho pulse sequences of the brain and surrounding structures were obtained without and with intravenous contrast. CONTRAST:  8 mL Vueway IV COMPARISON:  CT head 11/25/2021 FINDINGS: Brain: Acute infarct right lateral thalamus. No other acute infarct. Mild ventricular enlargement consistent with the degree of atrophy. Moderate white matter changes with periventricular deep white matter hyperintensity bilaterally. Negative for hemorrhage or mass. Normal enhancement. Vascular: Normal arterial flow voids Skull and upper cervical spine: No focal skeletal lesion. Cervical spondylosis. Sinuses/Orbits: Mild mucosal edema paranasal sinuses. Negative orbit Other: None IMPRESSION: Acute infarct right lateral thalamus Atrophy and moderate white matter changes consistent with chronic microvascular ischemia. Electronically Signed   By: Marlan Palau M.D.   On: 11/25/2021 14:26   CT ANGIO HEAD NECK W WO CM W PERF (CODE STROKE)  Result Date: 11/25/2021 CLINICAL DATA:  Neuro deficit, acute, stroke suspected EXAM: CT ANGIOGRAPHY HEAD AND NECK CT PERFUSION BRAIN TECHNIQUE: Multidetector CT imaging of the head and neck was performed using the standard protocol during bolus administration of intravenous contrast. Multiplanar CT image reconstructions and MIPs were obtained to evaluate the vascular anatomy. Carotid stenosis measurements (when  applicable) are obtained utilizing NASCET criteria, using the distal internal carotid diameter as the denominator. Multiphase CT imaging of the brain was performed following IV bolus contrast injection. Subsequent parametric perfusion maps were calculated using RAPID software. RADIATION DOSE REDUCTION: This exam was performed according to the departmental dose-optimization program which includes automated exposure control, adjustment of the mA and/or kV according to patient size and/or use of iterative reconstruction technique. CONTRAST:  OMNIPAQUE IOHEXOL 350 MG/ML SOLN COMPARISON:  None Available. FINDINGS: CTA NECK FINDINGS Aortic arch: Great vessel origins are patent without significant stenosis. Calcific atherosclerosis of the aorta. Right carotid system: No evidence of dissection, stenosis (50% or greater), or occlusion. Mild atherosclerosis. Left carotid system: No evidence of dissection, stenosis (50% or greater), or occlusion. Moderate atherosclerosis at the carotid bifurcation. Vertebral arteries: Right dominant. No evidence of dissection, stenosis (50% or greater), or occlusion. Skeleton: Severe multilevel degenerative change in the cervical spine. Other neck: No acute findings. Upper chest: Visualized lung apices are clear. Review of the MIP images confirms the above findings CTA  HEAD FINDINGS Anterior circulation: Bilateral intracranial ICAs are patent with calcific atherosclerosis but no significant stenosis. Bilateral MCAs and bilateral ACAs are patent without proximal hemodynamically significant stenosis. No aneurysm identified. Posterior circulation: Bilateral intradural vertebral arteries, basilar artery and bilateral posterior cerebral arteries are patent without proximal hemodynamically significant stenosis. Venous sinuses: As permitted by contrast timing, patent. CT Brain Perfusion Findings: ASPECTS: 10. CBF (<30%) Volume: 0mL Perfusion (Tmax>6.0s) volume: 0mL Mismatch Volume: 0mL  Infarction Location:None identified IMPRESSION: 1. No emergent large vessel occlusion or proximal hemodynamically significant stenosis. 2. No evidence of core infarct or penumbra on CT perfusion. Findings discussed with Dr. Selina CooleyStack via telephone at 12:45 p.m. Electronically Signed   By: Feliberto HartsFrederick S Jones M.D.   On: 11/25/2021 12:54   CT HEAD CODE STROKE WO CONTRAST  Result Date: 11/25/2021 CLINICAL DATA:  Code stroke.  Neuro deficit, acute, stroke suspected EXAM: CT HEAD WITHOUT CONTRAST TECHNIQUE: Contiguous axial images were obtained from the base of the skull through the vertex without intravenous contrast. RADIATION DOSE REDUCTION: This exam was performed according to the departmental dose-optimization program which includes automated exposure control, adjustment of the mA and/or kV according to patient size and/or use of iterative reconstruction technique. COMPARISON:  None Available. FINDINGS: Brain: No evidence of acute large vascular territory infarction, hemorrhage, hydrocephalus, extra-axial collection or mass lesion/mass effect. Patchy white matter hypodensities, nonspecific but compatible with chronic microvascular ischemic disease. Vascular: No hyperdense vessel identified. Calcific atherosclerosis. Skull: No acute fracture. Sinuses/Orbits: Mild paranasal sinus mucosal thickening. No acute orbital findings. Other: No mastoid effusions. ASPECTS [facility]([place] Stroke Program Early CT Score) total score (0-10 with 10 being normal): 10. IMPRESSION: 1. No evidence of acute intracranial abnormality. 2. ASPECTS is 10. Code stroke imaging results were communicated on 11/25/2021 at 12:26 pm to provider Selina CooleyStack via telephone, who verbally acknowledged these results. Electronically Signed   By: Feliberto HartsFrederick S Jones M.D.   On: 11/25/2021 12:26        Scheduled Meds:  aspirin  81 mg Oral Daily   atorvastatin  40 mg Oral QPM   clopidogrel  75 mg Oral Daily   heparin  5,000 Units Subcutaneous Q8H   Continuous  Infusions:  sodium chloride 50 mL/hr at 11/25/21 1753    Assessment & Plan:   Principal Problem:   Acute CVA (cerebrovascular accident) [facility](HCC) Active Problems:   Essential hypertension   CAD (coronary artery disease)   Acute infarct right lateral thalamus -persistent left sided weakness  -MRI head  + for right thalamic cva -admit tia/cva r/o protocol -Permissive HTN x48 hours from sx onset  -goal BP < 220/110, PRN above these parameters -  ASA 81mg  daily + plavix 75mg  daily x21 days f/b ASA 81mg  daily monotherapy after that -high intensity statin -neuro checks  9/28 EEG nml.  PT -rec. Acute inpt rehab Stroke education Amb referral to neurology upon discharge. Stat head Ct for any changes in neuro exam SLS 98, goal <70       HTN -permissive HTN x 48 hours ,hold oral medications for now  9/28 as needed hydralazine   CAD  -no active issues Chest pain-free   Anemia -at baseline     Recent Oral surgery -full mouth teeth extraction  -complete post procedure out patient Abx              DVT prophylaxis: Heparin Code Status: Full Family Communication: None at bedside Disposition Plan: inpt acute rehab Status is: Inpatient Remains inpatient appropriate because: IV treatment.  Needs acute rehab  inpatient.        LOS: 1 day   Time spent:    Lynn Ito, MD [facility] Pager [phone]  If 7PM-7AM, please contact night-coverage 11/26/2021, 4:52 PM

## 2021-11-26 NOTE — Procedures (Signed)
Routine EEG Report  Kevin [name] is a 88 y.o. male with a history of altered mental status who is undergoing an EEG to evaluate for seizures.  Report: This EEG was acquired with electrodes placed according to the International 10-20 electrode system (including Fp1, Fp2, F3, F4, C3, C4, P3, P4, O1, O2, T3, T4, T5, T6, A1, A2, Fz, Cz, Pz). The following electrodes were missing or displaced: none.  The occipital dominant rhythm was 9 Hz. This activity is reactive to stimulation. Drowsiness was manifested by background fragmentation; deeper stages of sleep were identified by K complexes and sleep spindles. There was no focal slowing. There were no interictal epileptiform discharges. There were no electrographic seizures identified. Photic stimulation and hyperventilation were not performed.   Impression: This EEG was obtained while awake and asleep and is normal.    Clinical Correlation: Normal EEGs, however, do not rule out epilepsy.  [name], MD [facility] [phone]  If 7pm- 7am, please page neurology on call as listed in [place].

## 2021-11-26 NOTE — Progress Notes (Signed)
   Inpatient Rehab Admissions Coordinator :  Per therapy recommendations, patient was screened for CIR candidacy by Danne Baxter RN MSN.  At this time patient appears to be a potential candidate for CIR. I will place a rehab consult per protocol for full assessment. An Admissions Coordinator will follow up  for full assessment.   Danne Baxter RN MSN Admissions Coordinator [phone]

## 2021-11-26 NOTE — Progress Notes (Signed)
*  PRELIMINARY RESULTS* Echocardiogram 2D Echocardiogram has been performed.  Kevin [name] 11/26/2021, 10:09 AM

## 2021-11-26 NOTE — Progress Notes (Signed)
eeG done 

## 2021-11-26 NOTE — Evaluation (Signed)
Speech Language Pathology Evaluation Patient Details Name: OTHELL JAIME MRN: [id number] DOB: 22-Jun-1933 Today's Date: 11/26/2021 Time: 9242-6834 SLP Time Calculation (min) (ACUTE ONLY): 25 min  Problem List:  Patient Active Problem List   Diagnosis Date Noted   Acute CVA (cerebrovascular accident) ([place]) 11/25/2021   Past Medical History:  Past Medical History:  Diagnosis Date   Anemia    Colon polyps    Coronary artery disease    Hematuria    History of hiatal hernia    Hypertension    Past Surgical History:  Past Surgical History:  Procedure Laterality Date   COLONOSCOPY WITH ESOPHAGOGASTRODUODENOSCOPY (EGD)     COLONOSCOPY WITH PROPOFOL N/A 09/27/2014   Procedure: COLONOSCOPY WITH PROPOFOL;  Surgeon: Manya Silvas, MD;  Location: [facility];  Service: Endoscopy;  Laterality: N/A;   JOINT REPLACEMENT     HPI:  LARRY ALCOCK is a 88 y.o. male with medical history significant of HTN, CAD non obstructive, Anemia  who presents to ED with complaint of left sided weakness . Per patient this am on  waking around 1 am he felt he was not able to move his arm or left leg. He states he attempted to ambulate and fell. Acute infarct right lateral thalamus. Atrophy and moderate white matter changes consistent with chronic microvascular ischemia. Pt passing Yale swallow assessment, now on regular solids and thin liquids. Pt alert and agreeable to SLP evaluation. Pt youngest daughter present intermittently for assessment.   Assessment / Plan / Recommendation Clinical Impression  Cognitive linguistic assessment initiated this date utilizing dynamic assessment measures and pt/family interview. Pt presents with grossly moderate cognitive communication deficit in the setting of acute right thalamic infarct. Deficits notable for short term recall, orientation, thought organization, and processing speed. Motor speech notable for intermittently reduced vocal intensity, as verified by daughter.  Relatively preserved sustained attention, verbal/receptive language, and simple sustained attention. Given family report of age related cognitive changes, expect that current deficits reflect an acute exacerbation of chronic deficits. PTA, pt was independent with medication management, driving, and yardwork; pt requiring assistance for financial management. Based on level of independence PTA, family report of acute changes to processing, and results of today's assessment, pt to benefit from follow up skilled ST. Education shared with daughter and patient regarding recommendations, cognitive engagement with aging, and benefit of follow up for concerns for onset of progressive disease (daughter specifically mentioning dementia).  Recommend inpatient rehab for further cognitive assessment and intervention. No further acute care intervention indicated at this time.     SLP Assessment  SLP Recommendation/Assessment: All further Speech Lanaguage Pathology  needs can be addressed in the next venue of care SLP Visit Diagnosis: Cognitive communication deficit (R41.841);Dysarthria and anarthria (R47.1)    Recommendations for follow up therapy are one component of a multi-disciplinary discharge planning process, led by the attending physician.  Recommendations may be updated based on patient status, additional functional criteria and insurance authorization.    Follow Up Recommendations  Acute inpatient rehab (3hours/day)    Assistance Recommended at Discharge  Intermittent Supervision/Assistance  Functional Status Assessment Patient has had a recent decline in their functional status and demonstrates the ability to make significant improvements in function in a reasonable and predictable amount of time.  Frequency and Duration           SLP Evaluation Cognition  Overall Cognitive Status: Impaired/Different from baseline Arousal/Alertness: Awake/alert Orientation Level: Oriented to person;Oriented to  place;Oriented to situation (pt not oriented to  date or time) Year: 2023 Month: March Attention: Divided Divided Attention: Impaired Divided Attention Impairment: Verbal basic Memory: Impaired Memory Impairment: Decreased short term memory;Decreased recall of new information Decreased Short Term Memory: Verbal basic (limited recall for education completed for use of call bell following 10 min delay) Awareness: Impaired Problem Solving: Impaired Problem Solving Impairment: Verbal basic Executive Function: Self Monitoring Self Monitoring: Impaired (mod verbal cues for identification and clarification for impact of physical deficits) Self Monitoring Impairment: Verbal basic Safety/Judgment: Impaired (1/4 safety prompts (inc to 3/4 with min verbal cues))       Comprehension  Auditory Comprehension Overall Auditory Comprehension: Appears within functional limits for tasks assessed (appropriate response across questions) Visual Recognition/Discrimination Discrimination: Not tested Reading Comprehension Reading Status: Not tested    Expression Expression Primary Mode of Expression: Verbal Verbal Expression Overall Verbal Expression: Appears within functional limits for tasks assessed   Oral / Motor  Oral Motor/Sensory Function Overall Oral Motor/Sensory Function: Within functional limits (grossly WFL - inconsistent report of fluctuations in factial sensation) Motor Speech Overall Motor Speech: Impaired Respiration: Within functional limits Phonation: Low vocal intensity Resonance: Within functional limits Articulation: Within functional limitis Intelligibility: Intelligible Motor Planning: Witnin functional limits Motor Speech Errors: Inconsistent Effective Techniques: Increased vocal intensity   Martinique Khanh Tanori Clapp MS [facility] SLP         Martinique J Clapp 11/26/2021, 12:07 PM

## 2021-11-27 ENCOUNTER — Other Ambulatory Visit: Payer: Self-pay

## 2021-11-27 DIAGNOSIS — I639 Cerebral infarction, unspecified: Secondary | ICD-10-CM | POA: Diagnosis not present

## 2021-11-27 MED ORDER — HYDROCERIN EX CREA
TOPICAL_CREAM | Freq: Two times a day (BID) | CUTANEOUS | Status: DC
  Filled 2021-11-27: qty 113

## 2021-11-27 NOTE — Care Management Important Message (Signed)
Important Message  Patient Details  Name: Kevin [name] MRN: [id number] Date of Birth: 1933-11-23   Medicare Important Message Given:  N/A - LOS <3 / Initial given by admissions     Dannette Barbara 11/27/2021, 9:37 AM

## 2021-11-27 NOTE — Progress Notes (Signed)
Inpatient Rehabilitation Admissions Coordinator   Rehab Consult received. I left a voicemail for his daughter, Vaughan Basta, to call me to discuss his rehab needs and family preference. Acute team and TOC made aware that I am waiting to discuss with his daughter, Vaughan Basta.  Danne Baxter, RN, MSN Rehab Admissions Coordinator [phone] 11/27/2021 10:25 AM

## 2021-11-27 NOTE — Progress Notes (Signed)
Nurse received notification from [facility] that patient in 1st degree heart block type 2 and then minutes later that is appeared to be in complete heart block . EKG obtained showing 1st degree heart block. Sharion Settler, NP notified via [facility] and secure chat. No new orders. Patient stable and family at bedside.

## 2021-11-27 NOTE — Plan of Care (Signed)

## 2021-11-27 NOTE — Progress Notes (Signed)
Occupational Therapy Treatment Patient Details Name: Kevin [name] MRN: [id number] DOB: 1933-04-23 Today's Date: 11/27/2021   History of present illness Pt is an 88 year old male admitted with acute R thalamic ischemic infarct; PMH significant for HTN, CAD, anemia   OT comments  Chart reviewed, pt eager to participate in OT on this date. Improved cognition noted, however pt with continued processing and safety awareness impairments. Tx session targeted improving functional mobility tolerance in preparation for ADL task completion; improving independence in ADL tasks via task oriented training. Progress is noted throughout as evidenced by pt amb approx 10' with RW with MOD A, step by step vcs for technique. Heavy L lateral lean noted throughout. STS from regular bed/chair height with MOD-MAX A multiple attempts with step by step vcs throughout. Grooming tasks completed in sitting with SET UP. Pt endorses no numbness/tingling, L hand grip strength appears to continue to improve. Discharge recommendation remains appropriate, OT will continue to follow acutely.    Recommendations for follow up therapy are one component of a multi-disciplinary discharge planning process, led by the attending physician.  Recommendations may be updated based on patient status, additional functional criteria and insurance authorization.    Follow Up Recommendations  Acute inpatient rehab (3hours/day)    Assistance Recommended at Discharge Frequent or constant Supervision/Assistance  Patient can return home with the following  A lot of help with walking and/or transfers;A lot of help with bathing/dressing/bathroom   Equipment Recommendations  BSC/3in1;Tub/shower seat    Recommendations for Other Services Rehab consult    Precautions / Restrictions Precautions Precautions: Fall       Mobility Bed Mobility Overal bed mobility: Needs Assistance Bed Mobility: Supine to Sit     Supine to sit: Min assist, HOB  elevated          Transfers Overall transfer level: Needs assistance   Transfers: Sit to/from Stand Sit to Stand: Mod assist, Max assist           General transfer comment: continued lateral lean in standing requiring MOD- MAX A to correct     Balance Overall balance assessment: Needs assistance Sitting-balance support: Feet supported Sitting balance-Leahy Scale: Fair   Postural control: Left lateral lean Standing balance support: Bilateral upper extremity supported, During functional activity, Reliant on assistive device for balance Standing balance-Leahy Scale: Poor Standing balance comment: left lateral lean presistent throughout                           ADL either performed or assessed with clinical judgement   ADL Overall ADL's : Needs assistance/impaired     Grooming: Minimal assistance;Sitting;Wash/dry face           Upper Body Dressing : Minimal assistance;Sitting Upper Body Dressing Details (indicate cue type and reason): gown     Toilet Transfer: Moderate assistance;Ambulation;Rolling walker (2 wheels) Toilet Transfer Details (indicate cue type and reason): simulated, heavy L lateral lean, Step by step vcs for technique         Functional mobility during ADLs: Moderate assistance;Rolling walker (2 wheels);Cueing for safety;Cueing for sequencing (approx 10' in room with RW, close chair follow)      Extremity/Trunk Assessment              Vision       Perception     Praxis      Cognition Arousal/Alertness: Awake/alert Behavior During Therapy: WFL for tasks assessed/performed Overall Cognitive Status: Impaired/Different from baseline Area  of Impairment: Problem solving, Awareness                       Following Commands: Follows one step commands with increased time Safety/Judgement: Decreased awareness of deficits Awareness: Emergent Problem Solving: Slow processing, Requires verbal cues, Requires tactile  cues General Comments: improved awareness as compared to yesterday's evaluation however deficits persist        Exercises      Shoulder Instructions       General Comments      Pertinent Vitals/ Pain       Pain Assessment Pain Assessment: No/denies pain  Home Living                                          Prior Functioning/Environment              Frequency  Min 4X/week        Progress Toward Goals  OT Goals(current goals can now be found in the care plan section)  Progress towards OT goals: Progressing toward goals     Plan Discharge plan remains appropriate    Co-evaluation                 AM-PAC OT "6 Clicks" Daily Activity     Outcome Measure   Help from another person eating meals?: A Little Help from another person taking care of personal grooming?: A Little Help from another person toileting, which includes using toliet, bedpan, or urinal?: A Lot Help from another person bathing (including washing, rinsing, drying)?: A Lot Help from another person to put on and taking off regular upper body clothing?: A Little Help from another person to put on and taking off regular lower body clothing?: A Lot 6 Click Score: 15    End of Session Equipment Utilized During Treatment: Gait belt;Rolling walker (2 wheels)  OT Visit Diagnosis: History of falling (Z91.81);Unsteadiness on feet (R26.81);Other symptoms and signs involving the nervous system (R29.898);Hemiplegia and hemiparesis Hemiplegia - Right/Left: Left Hemiplegia - dominant/non-dominant: Non-Dominant Hemiplegia - caused by: Cerebral infarction   Activity Tolerance Patient tolerated treatment well   Patient Left with call bell/phone within reach;with family/visitor present;in chair   Nurse Communication Mobility status        Time: 1245-8099 OT Time Calculation (min): 25 min  Charges: OT General Charges $OT Visit: 1 Visit OT Treatments $Self Care/Home Management  : 8-22 mins $Neuromuscular Re-education: 8-22 mins  Shanon Payor, OTD OTR/L  11/27/21, 11:23 AM

## 2021-11-27 NOTE — Progress Notes (Signed)
PROGRESS NOTE    Kevin [name]  [id number] DOB: [birth year] DOA: 11/25/2021 PCP: Leonel Ramsay, MD    Brief Narrative:  Kevin [name] is a 88 y.o. male with medical history significant of  HTN, CAD non obstructive, Anemia  who presents to ED with complaint of left sided weakness . Per patient this am on  waking around 1 am he felt he was not able to move his arm or left leg. He states he attempted to ambulate and fell.  He notes no associated HA, dizzy , chest pain , slurred speech, confusion, difficulty swallowing or prior symptoms like this in the past. ON further ros no fever/ chills/ n/v/cough or sob.    ED Course:  Vitals: Afeb, hr 68, rr 18, sat 100% on ra  CODE CVA CTH:. No evidence of acute intracranial abnormality. CTA neck:1. No emergent large vessel occlusion or proximal hemodynamically significant stenosis. 2. No evidence of core infarct or penumbra on CT perfusion.   MRI Acute infarct right lateral thalamus    MRI spine Lumbar spondylosis. Multilevel spinal and subarticular stenosis  Mild thoracic degenerative change most prominent at T10-11. No significant stenosis     9/28 feels his left legs again. No sob , cp, dizziness, new numbness. 9/29 daughter at bedside. No acute issues  Consultants:  neurology  Procedures:   Antimicrobials:      Subjective: As above  Objective: Vitals:   11/26/21 2352 11/27/21 0808 11/27/21 1132 11/27/21 1702  BP: (!) 173/85 (!) 152/83 (!) 155/83 (!) 175/83  Pulse: 62 61 66 68  Resp: 18 18 18 18   Temp:  98.1 F (36.7 C) 98 F (36.7 C) 98.2 F (36.8 C)  TempSrc:  Oral Oral   SpO2: 100% 100% 100% 100%  Weight:      Height:        Intake/Output Summary (Last 24 hours) at 11/27/2021 1719 Last data filed at 11/27/2021 1401 Gross per 24 hour  Intake 600 ml  Output 400 ml  Net 200 ml   Filed Weights   11/25/21 1219 11/26/21 1753  Weight: 86.2 kg 73.7 kg    Examination: Calm, NAD Cta no w/r Reg s1/s2 no  gallop Soft benign +bs No edema Awake and alert Mood and affect appropriate in current setting     Data Reviewed: I have personally reviewed following labs and imaging studies  CBC: Recent Labs  Lab 11/25/21 1435  WBC 4.6  NEUTROABS 3.0  HGB 12.1*  HCT 37.7*  MCV 85.1  PLT 99991111   Basic Metabolic Panel: Recent Labs  Lab 11/25/21 1435  NA 139  K 3.6  CL 107  CO2 22  GLUCOSE 97  BUN 18  CREATININE 0.92  CALCIUM 8.6*   GFR: Estimated Creatinine Clearance: 60.1 mL/min (by C-G formula based on SCr of 0.92 mg/dL). Liver Function Tests: Recent Labs  Lab 11/25/21 1435  AST 20  ALT 18  ALKPHOS 60  BILITOT 0.5  PROT 7.2  ALBUMIN 3.4*   No results for input(s): "LIPASE", "AMYLASE" in the last 168 hours. No results for input(s): "AMMONIA" in the last 168 hours. Coagulation Profile: Recent Labs  Lab 11/25/21 1435  INR 1.1   Cardiac Enzymes: No results for input(s): "CKTOTAL", "CKMB", "CKMBINDEX", "TROPONINI" in the last 168 hours. BNP (last 3 results) No results for input(s): "PROBNP" in the last 8760 hours. HbA1C: Recent Labs    11/25/21 1435  HGBA1C 5.7*   CBG: Recent Labs  Lab 11/25/21 1210  GLUCAP 99   Lipid Profile: Recent Labs    11/26/21 0459  CHOL 146  HDL 31*  LDLCALC 98  TRIG 84  CHOLHDL 4.7   Thyroid Function Tests: No results for input(s): "TSH", "T4TOTAL", "FREET4", "T3FREE", "THYROIDAB" in the last 72 hours. Anemia Panel: Recent Labs    11/25/21 1435 11/25/21 2015 11/26/21 0459  VITAMINB12  --   --  309  FOLATE  --  17.7  --   FERRITIN 35  --   --   TIBC 319  --   --   IRON 108  --   --   RETICCTPCT 1.2  --   --    Sepsis Labs: No results for input(s): "PROCALCITON", "LATICACIDVEN" in the last 168 hours.  Recent Results (from the past 240 hour(s))  Resp Panel by RT-PCR (Flu A&B, Covid) Anterior Nasal Swab     Status: None   Collection Time: 11/25/21  2:35 PM   Specimen: Anterior Nasal Swab  Result Value Ref Range  Status   SARS Coronavirus 2 by RT PCR NEGATIVE NEGATIVE Final    Comment: (NOTE) SARS-CoV-2 target nucleic acids are NOT DETECTED.  The SARS-CoV-2 RNA is generally detectable in upper respiratory specimens during the acute phase of infection. The lowest concentration of SARS-CoV-2 viral copies this assay can detect is 138 copies/mL. A negative result does not preclude SARS-Cov-2 infection and should not be used as the sole basis for treatment or other patient management decisions. A negative result may occur with  improper specimen collection/handling, submission of specimen other than nasopharyngeal swab, presence of viral mutation(s) within the areas targeted by this assay, and inadequate number of viral copies(<138 copies/mL). A negative result must be combined with clinical observations, patient history, and epidemiological information. The expected result is Negative.  Fact Sheet for Patients:  EntrepreneurPulse.com.au  Fact Sheet for Healthcare Providers:  IncredibleEmployment.be  This test is no t yet approved or cleared by the [place] FDA and  has been authorized for detection and/or diagnosis of SARS-CoV-2 by FDA under an Emergency Use Authorization (EUA). This EUA will remain  in effect (meaning this test can be used) for the duration of the COVID-19 declaration under Section 564(b)(1) of the Act, 21 U.S.C.section 360bbb-3(b)(1), unless the authorization is terminated  or revoked sooner.       Influenza A by PCR NEGATIVE NEGATIVE Final   Influenza B by PCR NEGATIVE NEGATIVE Final    Comment: (NOTE) The Xpert Xpress SARS-CoV-2/FLU/RSV plus assay is intended as an aid in the diagnosis of influenza from Nasopharyngeal swab specimens and should not be used as a sole basis for treatment. Nasal washings and aspirates are unacceptable for Xpert Xpress SARS-CoV-2/FLU/RSV testing.  Fact Sheet for  Patients: EntrepreneurPulse.com.au  Fact Sheet for Healthcare Providers: IncredibleEmployment.be  This test is not yet approved or cleared by the [place] FDA and has been authorized for detection and/or diagnosis of SARS-CoV-2 by FDA under an Emergency Use Authorization (EUA). This EUA will remain in effect (meaning this test can be used) for the duration of the COVID-19 declaration under Section 564(b)(1) of the Act, 21 U.S.C. section 360bbb-3(b)(1), unless the authorization is terminated or revoked.  Performed at [facility], [address] [place], [place] [address]          Radiology Studies: ECHOCARDIOGRAM COMPLETE  Result Date: 11/26/2021    ECHOCARDIOGRAM REPORT   Patient Name:   Kevin [name] [facility] Date of Exam: 11/26/2021 Medical Rec #:  [id number]  Height:       74.0 in Accession #:    [id number]    Weight:       190.0 lb Date of Birth:  11-May-1933      BSA:          2.127 m Patient Age:    37 years      BP:           150/71 mmHg Patient Gender: M             HR:           75 bpm. Exam Location:  [facility] Procedure: 2D Echo, Color Doppler, Cardiac Doppler and Saline Contrast Bubble            Study Indications:     I63.9 Stroke  History:         Patient has no prior history of Echocardiogram examinations.                  CAD; Risk Factors:Hypertension.  Sonographer:     Charmayne Sheer Referring Phys:  [id number] SARA-MAIZ A THOMAS Diagnosing Phys: Yolonda Kida MD IMPRESSIONS  1. Left ventricular ejection fraction, by estimation, is 70 to 75%. The left ventricle has hyperdynamic function. The left ventricle has no regional wall motion abnormalities. Left ventricular diastolic parameters were normal.  2. Right ventricular systolic function is normal. The right ventricular size is normal.  3. The mitral valve is normal in structure. Trivial mitral valve regurgitation.  4. The aortic valve is normal in structure. Aortic valve regurgitation is  not visualized. FINDINGS  Left Ventricle: Left ventricular ejection fraction, by estimation, is 70 to 75%. The left ventricle has hyperdynamic function. The left ventricle has no regional wall motion abnormalities. The left ventricular internal cavity size was normal in size. There is no left ventricular hypertrophy. Left ventricular diastolic parameters were normal. Right Ventricle: The right ventricular size is normal. No increase in right ventricular wall thickness. Right ventricular systolic function is normal. Left Atrium: Left atrial size was normal in size. Right Atrium: Right atrial size was normal in size. Pericardium: There is no evidence of pericardial effusion. Mitral Valve: The mitral valve is normal in structure. Trivial mitral valve regurgitation. Tricuspid Valve: The tricuspid valve is normal in structure. Tricuspid valve regurgitation is trivial. Aortic Valve: The aortic valve is normal in structure. Aortic valve regurgitation is not visualized. Aortic valve mean gradient measures 5.0 mmHg. Aortic valve peak gradient measures 9.5 mmHg. Aortic valve area, by VTI measures 1.96 cm. Pulmonic Valve: The pulmonic valve was normal in structure. Pulmonic valve regurgitation is not visualized. Aorta: The ascending aorta was not well visualized. IAS/Shunts: No atrial level shunt detected by color flow Doppler. Agitated saline contrast was given intravenously to evaluate for intracardiac shunting.  LEFT VENTRICLE PLAX 2D LVIDd:         4.20 cm   Diastology LVIDs:         2.20 cm   LV e' medial:    8.92 cm/s LV PW:         1.20 cm   LV E/e' medial:  9.0 LV IVS:        1.00 cm   LV e' lateral:   13.50 cm/s LVOT diam:     1.80 cm   LV E/e' lateral: 5.9 LV SV:         60 LV SV Index:   28 LVOT Area:     2.54 cm  RIGHT VENTRICLE RV  Basal diam:  4.20 cm RV S prime:     18.70 cm/s TAPSE (M-mode): 2.8 cm LEFT ATRIUM             Index        RIGHT ATRIUM           Index LA diam:        3.70 cm 1.74 cm/m   RA Area:      16.40 cm LA Vol (A2C):   72.9 ml 34.28 ml/m  RA Volume:   44.30 ml  20.83 ml/m LA Vol (A4C):   85.4 ml 40.15 ml/m LA Biplane Vol: 80.2 ml 37.71 ml/m  AORTIC VALVE                     PULMONIC VALVE AV Area (Vmax):    1.95 cm      PV Vmax:       1.25 m/s AV Area (Vmean):   1.89 cm      PV Peak grad:  6.2 mmHg AV Area (VTI):     1.96 cm AV Vmax:           154.00 cm/s AV Vmean:          105.000 cm/s AV VTI:            0.306 m AV Peak Grad:      9.5 mmHg AV Mean Grad:      5.0 mmHg LVOT Vmax:         118.00 cm/s LVOT Vmean:        78.000 cm/s LVOT VTI:          0.236 m LVOT/AV VTI ratio: 0.77  AORTA Ao Root diam: 3.30 cm MITRAL VALVE               TRICUSPID VALVE MV Area (PHT): 3.19 cm    TR Peak grad:   30.0 mmHg MV Decel Time: 238 msec    TR Vmax:        274.00 cm/s MV E velocity: 80.20 cm/s MV A velocity: 79.70 cm/s  SHUNTS MV E/A ratio:  1.01        Systemic VTI:  0.24 m                            Systemic Diam: 1.80 cm Yolonda Kida MD Electronically signed by Yolonda Kida MD Signature Date/Time: 11/26/2021/5:38:16 PM    Final    EEG adult  Result Date: 11/26/2021 Derek Jack, MD     11/26/2021  3:48 PM Routine EEG Report Kevin [name] is a 88 y.o. male with a history of altered mental status who is undergoing an EEG to evaluate for seizures. Report: This EEG was acquired with electrodes placed according to the International 10-20 electrode system (including Fp1, Fp2, F3, F4, C3, C4, P3, P4, O1, O2, T3, T4, T5, T6, A1, A2, Fz, Cz, Pz). The following electrodes were missing or displaced: none. The occipital dominant rhythm was 9 Hz. This activity is reactive to stimulation. Drowsiness was manifested by background fragmentation; deeper stages of sleep were identified by K complexes and sleep spindles. There was no focal slowing. There were no interictal epileptiform discharges. There were no electrographic seizures identified. Photic stimulation and hyperventilation were not performed.  Impression: This EEG was obtained while awake and asleep and is normal.   Clinical Correlation: Normal EEGs, however, do not rule out epilepsy. [name],  MD [facility] [phone] If 7pm- 7am, please page neurology on call as listed in [place].        Scheduled Meds:  aspirin  81 mg Oral Daily   atorvastatin  40 mg Oral QPM   clopidogrel  75 mg Oral Daily   heparin  5,000 Units Subcutaneous Q8H   hydrocerin   Topical BID   Continuous Infusions:    Assessment & Plan:   Principal Problem:   Acute CVA (cerebrovascular accident) ([place]) Active Problems:   Essential hypertension   CAD (coronary artery disease)   Acute infarct right lateral thalamus -persistent left sided weakness  -MRI head  + for right thalamic cva -admit tia/cva r/o protocol -Permissive HTN x48 hours from sx onset  -goal BP < 220/110, PRN above these parameters -  ASA 81mg  daily + plavix 75mg  daily x21 days f/b ASA 81mg  daily monotherapy after that -high intensity statin -neuro checks  9/28 EEG nml.  Stroke education Amb referral to neurology upon discharge. Stat head Ct for any changes in neuro exam LDL 98, goal <70  9/29 PT rec. Acute rehab. -pending     HTN -permissive HTN x 48 hours ,hold oral medications for now  9/29 continue iv bp meds    CAD  -no active issues 9/29 cp free    Anemia At baseline    Recent Oral surgery -full mouth teeth extraction  -complete post procedure out patient Abx              DVT prophylaxis: Heparin Code Status: Full Family Communication: None at bedside Disposition Plan: inpt acute rehab Status is: Inpatient Remains inpatient appropriate because: IV treatment.  Needs acute rehab inpatient.        LOS: 2 days   Time spent: 78min    Nolberto Hanlon, MD [facility] Pager [phone]  If 7PM-7AM, please contact night-coverage 11/27/2021, 5:19 PM

## 2021-11-27 NOTE — Progress Notes (Signed)
Physical Therapy Treatment Patient Details Name: Kevin [name] MRN: [id number] DOB: 08-Mar-1933 Today's Date: 11/27/2021   History of Present Illness Pt is an 88 year old male admitted with acute R thalamic ischemic infarct; PMH significant for HTN, CAD, anemia    PT Comments    Pt was sitting in recliner with supportive family at bedside. He is A and agreeable to session. Eager to improve so he can eventually safely return to home. Currently pt is far from baseline abilities. He requires extensive assistance to safely stand to RW. Vcs throughout for sequencing + improved technique. Session did progress to ambulation in room ~ 25 ft with constant min-mod assist of one. Max Vcs for increasing BOS and maintaining upright posture. He required min-mod assist to lateral wt shift towards R to allow LLE advancement. Overall pt is improving but will greatly benefit from continued skilled PT to maximize his independence while advancing back to PLOF. Pt would be great CIR patient.    Recommendations for follow up therapy are one component of a multi-disciplinary discharge planning process, led by the attending physician.  Recommendations may be updated based on patient status, additional functional criteria and insurance authorization.  Follow Up Recommendations  Acute inpatient rehab (3hours/day)     Assistance Recommended at Discharge Intermittent Supervision/Assistance  Patient can return home with the following Two people to help with walking and/or transfers;Two people to help with bathing/dressing/bathroom;Assist for transportation;Help with stairs or ramp for entrance   Equipment Recommendations  Other (comment) (defer to next level of care)       Precautions / Restrictions Precautions Precautions: Fall Restrictions Weight Bearing Restrictions: No     Mobility  Bed Mobility  General bed mobility comments: pt was in recliner pre/post session.    Transfers Overall transfer level: Needs  assistance Equipment used: Rolling walker (2 wheels) Transfers: Sit to/from Stand Sit to Stand: Mod assist    General transfer comment: Mod assist on first STS however able to progress to min assist by the end of session. performed STS 5 x from recliner surface throughout session    Ambulation/Gait Ambulation/Gait assistance: Min assist, Mod assist Gait Distance (Feet): 25 Feet Assistive device: Rolling walker (2 wheels) Gait Pattern/deviations: Step-through pattern, Trunk flexed, Narrow base of support, Decreased weight shift to right, Knee flexed in stance - left Gait velocity: decreased     General Gait Details: Pt was able to ambulate ~ 83 fy with min-mod assist of one. INcreased time + vcs for posture correction and improved step quality. pt tends to have very Narrow BOS with severe fwd flexed posture. with VCs is able to correct.    Balance Overall balance assessment: Needs assistance Sitting-balance support: Feet supported Sitting balance-Leahy Scale: Fair   Postural control: Left lateral lean Standing balance support: Bilateral upper extremity supported, During functional activity, Reliant on assistive device for balance Standing balance-Leahy Scale: Poor Standing balance comment: Pt is high fall risk       Cognition Arousal/Alertness: Awake/alert Behavior During Therapy: WFL for tasks assessed/performed Overall Cognitive Status: Impaired/Different from baseline Area of Impairment: Problem solving, Awareness    Following Commands: Follows one step commands with increased time Safety/Judgement: Decreased awareness of deficits, Decreased awareness of safety   Problem Solving: Slow processing, Requires verbal cues, Requires tactile cues General Comments: Pt lack full insight of deficits and has overall poor safety awareness. He is however able to follow commands consistently with increased processing time. Is oriented  General Comments General comments (skin  integrity, edema, etc.): Author educated pt and family on exercises to promote improved posture and NM return. They state understanding and report they will work on it.      Pertinent Vitals/Pain Pain Assessment Pain Assessment: No/denies pain     PT Goals (current goals can now be found in the care plan section) Acute Rehab PT Goals Patient Stated Goal: "Get back home and walk right again." Progress towards PT goals: Progressing toward goals    Frequency    7X/week      PT Plan Current plan remains appropriate    Co-evaluation     PT goals addressed during session: Mobility/safety with mobility;Balance        AM-PAC PT "6 Clicks" Mobility   Outcome Measure  Help needed turning from your back to your side while in a flat bed without using bedrails?: A Little Help needed moving from lying on your back to sitting on the side of a flat bed without using bedrails?: A Lot Help needed moving to and from a bed to a chair (including a wheelchair)?: A Lot Help needed standing up from a chair using your arms (e.g., wheelchair or bedside chair)?: A Lot Help needed to walk in hospital room?: A Lot Help needed climbing 3-5 steps with a railing? : A Lot 6 Click Score: 13    End of Session Equipment Utilized During Treatment: Gait belt Activity Tolerance: Patient tolerated treatment well Patient left: in chair;with call bell/phone within reach;with family/visitor present Nurse Communication: Mobility status PT Visit Diagnosis: Unsteadiness on feet (R26.81);Muscle weakness (generalized) (M62.81);Difficulty in walking, not elsewhere classified (R26.2);Hemiplegia and hemiparesis Hemiplegia - Right/Left: Left Hemiplegia - dominant/non-dominant: Non-dominant Hemiplegia - caused by: Cerebral infarction     Time: 1202-1217 PT Time Calculation (min) (ACUTE ONLY): 15 min  Charges:  $Gait Training: 8-22 mins                     Jetta Lout PTA 11/27/21, 12:34 PM

## 2021-11-27 NOTE — Consult Note (Signed)
Physical Medicine and Rehabilitation Consult Reason for Consult: Assess candidacy for CIR Referring Physician: Lynn Ito, MD   HPI: Kevin [name] is a 88 y.o. male with a past medical history significant for HTN, non-obstructive CAD, and anemia, who presented to the ED with left sided weakness. He had woken at 1am and was unable to move his left arm and leg. He tried to ambulate and he fell. MRI in the ED showed acute right lateral thalamic infarction. Physical Medicine & Rehabilitation was consulted to assess candidacy for CIR.     ROS sleeping well at night, moving bowels regularly, denies pain, has left sided weakness but this is improving Past Medical History:  Diagnosis Date   Anemia    Colon polyps    Coronary artery disease    Hematuria    History of hiatal hernia    Hypertension    Past Surgical History:  Procedure Laterality Date   COLONOSCOPY WITH ESOPHAGOGASTRODUODENOSCOPY (EGD)     COLONOSCOPY WITH PROPOFOL N/A 09/27/2014   Procedure: COLONOSCOPY WITH PROPOFOL;  Surgeon: Scot Jun, MD;  Location: [facility] ENDOSCOPY;  Service: Endoscopy;  Laterality: N/A;   JOINT REPLACEMENT     No family history on file. Social History:  reports that he has never smoked. He does not have any smokeless tobacco history on file. He reports that he does not drink alcohol and does not use drugs. Allergies: No Known Allergies Medications Prior to Admission  Medication Sig Dispense Refill   amLODipine (NORVASC) 10 MG tablet Take 10 mg by mouth daily.     amoxicillin (AMOXIL) 500 MG tablet Take 500 mg by mouth 3 (three) times daily. (For 7 days)     chlorhexidine (PERIDEX) 0.12 % solution Use as directed 15 mLs in the mouth or throat 2 (two) times daily.     ibuprofen (ADVIL) 600 MG tablet Take 600 mg by mouth every 6 (six) hours as needed for mild pain or moderate pain.     triamterene-hydrochlorothiazide (MAXZIDE-25) 37.5-25 MG per tablet Take 1 tablet by mouth daily.       Home: Home Living Family/patient expects to be discharged to:: Private residence Living Arrangements: Children (Daughter, Idell Pickles and her 2 sons 1 and 34 years old live with him) Available Help at Discharge: Family, Available 24 hours/day Type of Home: House Home Access: Level entry Home Layout: One level Bathroom Shower/Tub: Health visitor: Standard Bathroom Accessibility: Yes Home Equipment: Medical laboratory scientific officer - single point  Lives With: Family (Family live in his home)  Functional History: Prior Function Prior Level of Function : Independent/Modified Independent Mobility Comments: prn cane use community distances; one fall morning of admission ADLs Comments: MOD I-I in ADL/IADL, cooks, cleans, drives, grocery shops, 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 Transfers Overall transfer level: Needs assistance Equipment used: Rolling walker (2 wheels) Transfers: Sit to/from Stand Sit to Stand: Mod assist General transfer comment: Mod assist on first STS however able to progress to min assist by the end of session. performed STS 5 x from recliner surface throughout session Ambulation/Gait Ambulation/Gait assistance: Min assist, Mod assist Gait Distance (Feet): 25 Feet Assistive device: Rolling walker (2 wheels) Gait Pattern/deviations: Step-through pattern, Trunk flexed, Narrow base of support, Decreased weight shift to right, Knee flexed in  stance - left General Gait Details: Pt was able to ambulate ~ 25 fy with min-mod assist of one. INcreased time + vcs for posture correction and improved step quality. pt tends to have very Narrow BOS with severe fwd flexed posture. with VCs is able to correct. Gait velocity: decreased    ADL: ADL Overall ADL's :  Needs assistance/impaired Grooming: Minimal assistance, Sitting, Wash/dry face Upper Body Dressing : Minimal assistance, Sitting Upper Body Dressing Details (indicate cue type and reason): gown Lower Body Dressing: Maximal assistance, Sit to/from stand, Cueing for sequencing Toilet Transfer: Moderate assistance, Ambulation, Rolling walker (2 wheels) Toilet Transfer Details (indicate cue type and reason): simulated, heavy L lateral lean, Step by step vcs for technique Toileting- Clothing Manipulation and Hygiene: Maximal assistance, Sit to/from stand, Cueing for sequencing Functional mobility during ADLs: Moderate assistance, Rolling walker (2 wheels), Cueing for safety, Cueing for sequencing (approx 10' in room with RW, close chair follow)  Cognition: Cognition Overall Cognitive Status: Impaired/Different from baseline Arousal/Alertness: Awake/alert Orientation Level: Oriented X4 Year: 2023 Month: March Attention: Divided Divided Attention: Impaired Divided Attention Impairment: Verbal basic Memory: Impaired Memory Impairment: Decreased short term memory, Decreased recall of new information Decreased Short Term Memory: Verbal basic (limited recall for education completed for use of call bell following 10 min delay) Awareness: Impaired Problem Solving: Impaired Problem Solving Impairment: Verbal basic Executive Function: Self Monitoring Self Monitoring: Impaired (mod verbal cues for identification and clarification for impact of physical deficits) Self Monitoring Impairment: Verbal basic Safety/Judgment: Impaired (1/4 safety prompts (inc to 3/4 with min verbal cues)) Cognition Arousal/Alertness: Awake/alert Behavior During Therapy: WFL for tasks assessed/performed Overall Cognitive Status: Impaired/Different from baseline Area of Impairment: Problem solving, Awareness Orientation Level: Disoriented to, Time Current Attention Level: Sustained Following Commands: Follows one step  commands with increased time Safety/Judgement: Decreased awareness of deficits, Decreased awareness of safety Awareness: Emergent Problem Solving: Slow processing, Requires verbal cues, Requires tactile cues General Comments: Pt lack full insight of deficits and has overall poor safety awareness. He is however able to follow commands consistently with increased processing time. Is oriented  Blood pressure (!) 175/83, pulse 68, temperature 98.2 F (36.8 C), resp. rate 18, height 6\' 2"  (1.88 m), weight 73.7 kg, SpO2 100 %. Physical Exam Gen: no distress, normal appearing HEENT: oral mucosa pink and moist, NCAT Cardio: Reg rate Chest: normal effort, normal rate of breathing Abd: soft, non-distended Ext: no edema Psych: pleasant, normal affect Skin: intact Neuro: Alert and oriented x3. Follows commands well. Strength 5/5 on right, 4/5 LUE, 3/5 LLE, sensation decreased on left side  No results found for this or any previous visit (from the past 24 hour(s)). ECHOCARDIOGRAM COMPLETE  Result Date: 11/26/2021    ECHOCARDIOGRAM REPORT   Patient Name:   ROCKFORD LEINEN [facility] Date of Exam: 11/26/2021 Medical Rec #:  [id number]     Height:       74.0 in Accession #:    [id number]    Weight:       190.0 lb Date of Birth:  1933-08-10      BSA:          2.127 m Patient Age:    78 years      BP:           150/71 mmHg Patient Gender: M             HR:           75 bpm. Exam Location:  [facility] Procedure: 2D Echo,  Color Doppler, Cardiac Doppler and Saline Contrast Bubble            Study Indications:     I63.9 Stroke  History:         Patient has no prior history of Echocardiogram examinations.                  CAD; Risk Factors:Hypertension.  Sonographer:     Humphrey Rolls Referring Phys:  [id number] SARA-MAIZ A THOMAS Diagnosing Phys: Alwyn Pea MD IMPRESSIONS  1. Left ventricular ejection fraction, by estimation, is 70 to 75%. The left ventricle has hyperdynamic function. The left ventricle has no regional wall motion  abnormalities. Left ventricular diastolic parameters were normal.  2. Right ventricular systolic function is normal. The right ventricular size is normal.  3. The mitral valve is normal in structure. Trivial mitral valve regurgitation.  4. The aortic valve is normal in structure. Aortic valve regurgitation is not visualized. FINDINGS  Left Ventricle: Left ventricular ejection fraction, by estimation, is 70 to 75%. The left ventricle has hyperdynamic function. The left ventricle has no regional wall motion abnormalities. The left ventricular internal cavity size was normal in size. There is no left ventricular hypertrophy. Left ventricular diastolic parameters were normal. Right Ventricle: The right ventricular size is normal. No increase in right ventricular wall thickness. Right ventricular systolic function is normal. Left Atrium: Left atrial size was normal in size. Right Atrium: Right atrial size was normal in size. Pericardium: There is no evidence of pericardial effusion. Mitral Valve: The mitral valve is normal in structure. Trivial mitral valve regurgitation. Tricuspid Valve: The tricuspid valve is normal in structure. Tricuspid valve regurgitation is trivial. Aortic Valve: The aortic valve is normal in structure. Aortic valve regurgitation is not visualized. Aortic valve mean gradient measures 5.0 mmHg. Aortic valve peak gradient measures 9.5 mmHg. Aortic valve area, by VTI measures 1.96 cm. Pulmonic Valve: The pulmonic valve was normal in structure. Pulmonic valve regurgitation is not visualized. Aorta: The ascending aorta was not well visualized. IAS/Shunts: No atrial level shunt detected by color flow Doppler. Agitated saline contrast was given intravenously to evaluate for intracardiac shunting.  LEFT VENTRICLE PLAX 2D LVIDd:         4.20 cm   Diastology LVIDs:         2.20 cm   LV e' medial:    8.92 cm/s LV PW:         1.20 cm   LV E/e' medial:  9.0 LV IVS:        1.00 cm   LV e' lateral:   13.50 cm/s  LVOT diam:     1.80 cm   LV E/e' lateral: 5.9 LV SV:         60 LV SV Index:   28 LVOT Area:     2.54 cm  RIGHT VENTRICLE RV Basal diam:  4.20 cm RV S prime:     18.70 cm/s TAPSE (M-mode): 2.8 cm LEFT ATRIUM             Index        RIGHT ATRIUM           Index LA diam:        3.70 cm 1.74 cm/m   RA Area:     16.40 cm LA Vol (A2C):   72.9 ml 34.28 ml/m  RA Volume:   44.30 ml  20.83 ml/m LA Vol (A4C):   85.4 ml 40.15 ml/m LA Biplane Vol: 80.2  ml 37.71 ml/m  AORTIC VALVE                     PULMONIC VALVE AV Area (Vmax):    1.95 cm      PV Vmax:       1.25 m/s AV Area (Vmean):   1.89 cm      PV Peak grad:  6.2 mmHg AV Area (VTI):     1.96 cm AV Vmax:           154.00 cm/s AV Vmean:          105.000 cm/s AV VTI:            0.306 m AV Peak Grad:      9.5 mmHg AV Mean Grad:      5.0 mmHg LVOT Vmax:         118.00 cm/s LVOT Vmean:        78.000 cm/s LVOT VTI:          0.236 m LVOT/AV VTI ratio: 0.77  AORTA Ao Root diam: 3.30 cm MITRAL VALVE               TRICUSPID VALVE MV Area (PHT): 3.19 cm    TR Peak grad:   30.0 mmHg MV Decel Time: 238 msec    TR Vmax:        274.00 cm/s MV E velocity: 80.20 cm/s MV A velocity: 79.70 cm/s  SHUNTS MV E/A ratio:  1.01        Systemic VTI:  0.24 m                            Systemic Diam: 1.80 cm Alwyn Peawayne D Callwood MD Electronically signed by Alwyn Peawayne D Callwood MD Signature Date/Time: 11/26/2021/5:38:16 PM    Final    EEG adult  Result Date: 11/26/2021 Jefferson FuelStack, Colleen M, MD     11/26/2021  3:48 PM Routine EEG Report Eusebio MeJames H Frankson is a 88 y.o. male with a history of altered mental status who is undergoing an EEG to evaluate for seizures. Report: This EEG was acquired with electrodes placed according to the International 10-20 electrode system (including Fp1, Fp2, F3, F4, C3, C4, P3, P4, O1, O2, T3, T4, T5, T6, A1, A2, Fz, Cz, Pz). The following electrodes were missing or displaced: none. The occipital dominant rhythm was 9 Hz. This activity is reactive to stimulation. Drowsiness  was manifested by background fragmentation; deeper stages of sleep were identified by K complexes and sleep spindles. There was no focal slowing. There were no interictal epileptiform discharges. There were no electrographic seizures identified. Photic stimulation and hyperventilation were not performed. Impression: This EEG was obtained while awake and asleep and is normal.   Clinical Correlation: Normal EEGs, however, do not rule out epilepsy. Bing Neighborsolleen Stack, MD [facility] [phone] If 7pm- 7am, please page neurology on call as listed in AMION.      Assessment/Plan: Diagnosis: Acute right thalamic infarction Does the need for close, 24 hr/day medical supervision in concert with the patient's rehab needs make it unreasonable for this patient to be served in a less intensive setting? Yes Co-Morbidities requiring supervision/potential complications:  HTN: monitor BP TID CAD Anemia: monitor Hgb weekly Hypoalbuminemia: educated regarding high protein diet Low HDL Due to bladder management, bowel management, safety, skin/wound care, disease management, medication administration, pain management, and patient education, does the patient require 24 hr/day rehab nursing? Yes Does the  patient require coordinated care of a physician, rehab nurse, therapy disciplines of PT and OT to address physical and functional deficits in the context of the above medical diagnosis(es)? Yes Addressing deficits in the following areas: balance, endurance, locomotion, strength, transferring, bowel/bladder control, bathing, dressing, feeding, and grooming Can the patient actively participate in an intensive therapy program of at least 3 hrs of therapy per day at least 5 days per week? Yes The potential for patient to make measurable gains while on inpatient rehab is excellent Anticipated functional outcomes upon discharge from inpatient rehab are modified independent  with PT, modified independent with OT,  independent with SLP. Estimated rehab length of stay to reach the above functional goals is: 10-14 days Anticipated discharge destination: Home Overall Rehab/Functional Prognosis: excellent  RECOMMENDATIONS: This patient's condition is appropriate for continued rehabilitative care in the following setting: CIR Patient has agreed to participate in recommended program. Yes Note that insurance prior authorization may be required for reimbursement for recommended care.   Horton Chin, MD 11/27/2021

## 2021-11-27 NOTE — Plan of Care (Signed)

## 2021-11-27 NOTE — PMR Pre-admission (Signed)
PMR Admission Coordinator Pre-Admission Assessment  Patient: Kevin [name] is an 88 y.o., male MRN: [id number] DOB: 1933-09-20 Height: 6\' 2"  (188 cm) Weight: 73.7 kg              Insurance Information HMO:     PPO:      PCP:      IPA:      80/20:      OTHER:  PRIMARY: [employer]      Policy#:      Subscriber: pt CM Name: ***      Phone#: [phone] ext ***     Fax#: [phone] Pre-Cert#: [id number]      Employer:  Benefits:  Phone #: [phone]     Name: 9/29 Eff. Date: 03/01/21     Deduct: none      Out of Pocket Max: $3400      Life Max: none  CIR: $295 co pay per day days 1 until 6      SNF: no co pay for days 1 until 20; $196 co pay per day days 21 until 100 Outpatient: $10 to $20 per visit     Co-Pay: visits per medical neccesity Home Health: 100%      Co-Pay: visits per medical neccesity DME: 80%     Co-Pay: 20% Providers: in network  SECONDARY: none  Financial Counselor:       Phone#:   The 04/29/21" for patients in Inpatient Rehabilitation Facilities with attached "Privacy Act Statement-Health Care Records" was provided and verbally reviewed with: Family  Emergency Contact Information Contact Information     Name Relation Home Work [place]i [place] (POA),Kevin [name] Daughter   [phone]   [place],Kevin [name] Daughter   [phone]      Current Medical History  Patient Admitting Diagnosis: CVA  History of Present Illness: 88 year old male with history of HTN, CAD, anemia who presented on 11/25/21 with left sided weakness. 11/27/21 when attempted to walk.   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 CTA neck with no emergent large vessel occlusion or proximal. To allow permissive HTN for 48 hrs. Neurology consulted with recommendation for ASA and Plavix for 3 weeks followed by ASA monotherapy. Recommend high intensity statin. EEG normal. 2D echo with EF 70 to 75%, LV with hyperdynamic function, no intracardiac clot. Minimal  dysarthria. LUE ataxia improving. Etiology of CVA felt to be small vessel.    Glasgow Coma Scale Score: 15  Past Medical History  Past Medical History:  Diagnosis Date   Anemia    Colon polyps    Coronary artery disease    Hematuria    History of hiatal hernia    Hypertension    Has the patient had major surgery during 100 days prior to admission? No  Family History  family history is not on file.   Current Medications   Current Facility-Administered Medications:    acetaminophen (TYLENOL) tablet 650 mg, 650 mg, Oral, Q4H PRN, 650 mg at 11/26/21 2251 **OR** acetaminophen (TYLENOL) 160 MG/5ML solution 650 mg, 650 mg, Per Tube, Q4H PRN **OR** acetaminophen (TYLENOL) suppository 650 mg, 650 mg, Rectal, Q4H PRN, 11/28/21 A, MD   aspirin chewable tablet 81 mg, 81 mg, Oral, Daily, Skip Mayer, MD, 81 mg at 11/27/21 1039   atorvastatin (LIPITOR) tablet 40 mg, 40 mg, Oral, QPM, 11/29/21, MD, 40 mg at 11/26/21 2045   clopidogrel (PLAVIX) tablet 75 mg, 75 mg, Oral, Daily, 2046, MD, 75 mg  at 11/27/21 1039   heparin injection 5,000 Units, 5,000 Units, Subcutaneous, Q8H, Lurline Del, MD, 5,000 Units at 11/27/21 0641   hydrALAZINE (APRESOLINE) injection 10 mg, 10 mg, Intravenous, Q6H PRN, Lynn Ito, MD   senna-docusate (Senokot-S) tablet 1 tablet, 1 tablet, Oral, QHS PRN, Lurline Del, MD  Patients Current Diet:  Diet Order             Diet Heart Room service appropriate? Yes; Fluid consistency: Thin; Fluid restriction: Other (see comments)  Diet effective now                  Precautions / Restrictions Precautions Precautions: Fall Restrictions Weight Bearing Restrictions: No   Has the patient had 2 or more falls or a fall with injury in the past year?No  Prior Activity Level Community (5-7x/wk): MOd I with cane as needed, Drives, mows his grass with push mower  Prior Functional Level Prior Function Prior Level of Function :  Independent/Modified Independent Mobility Comments: prn cane use community distances; one fall morning of admission ADLs Comments: MOD I-I in ADL/IADL, cooks, cleans, drives, grocery shops, mows his 2 acre lawn  Self Care: Did the patient need help bathing, dressing, using the toilet or eating?  Independent  Indoor Mobility: Did the patient need assistance with walking from room to room (with or without device)? Independent  Stairs: Did the patient need assistance with internal or external stairs (with or without device)? Independent  Functional Cognition: Did the patient need help planning regular tasks such as shopping or remembering to take medications? Independent  Patient Information Are you of Hispanic, Latino/a,or Spanish origin?: A. No, not of Hispanic, Latino/a, or Spanish origin What is your race?: B. Black or African American Do you need or want an interpreter to communicate with a doctor or health care staff?: 0. No  Patient's Response To:  Health Literacy and Transportation Is the patient able to respond to health literacy and transportation needs?: Yes Health Literacy - How often do you need to have someone help you when you read instructions, pamphlets, or other written material from your doctor or pharmacy?: Never In the past 12 months, has lack of transportation kept you from medical appointments or from getting medications?: No In the past 12 months, has lack of transportation kept you from meetings, work, or from getting things needed for daily living?: No  Home Assistive Devices / Equipment Home Equipment: Gilmer Mor - single point  Prior Device Use: Indicate devices/aids used by the patient prior to current illness, exacerbation or injury?  Cane prn  Current Functional Level Cognition  Arousal/Alertness: Awake/alert Overall Cognitive Status: Impaired/Different from baseline Current Attention Level: Sustained Orientation Level: Disoriented to situation, Disoriented to  time, Disoriented to place Following Commands: Follows one step commands with increased time Safety/Judgement: Decreased awareness of deficits, Decreased awareness of safety General Comments: Pt lack full insight of deficits and has overall poor safety awareness. He is however able to follow commands consistently with increased processing time. Is oriented Attention: Divided Divided Attention: Impaired Divided Attention Impairment: Verbal basic Memory: Impaired Memory Impairment: Decreased short term memory, Decreased recall of new information Decreased Short Term Memory: Verbal basic (limited recall for education completed for use of call bell following 10 min delay) Awareness: Impaired Problem Solving: Impaired Problem Solving Impairment: Verbal basic Executive Function: Self Monitoring Self Monitoring: Impaired (mod verbal cues for identification and clarification for impact of physical deficits) Self Monitoring Impairment: Verbal basic Safety/Judgment: Impaired (1/4 safety prompts (inc  to 3/4 with min verbal cues))    Extremity Assessment (includes Sensation/Coordination)  Upper Extremity Assessment: Defer to OT evaluation RUE Sensation: WNL RUE Coordination: WNL LUE Deficits / Details: AROM: shoulder flexion: to approx 80 degrees, elbow flexion/extension approx 3/4 full AROM, wrist approx 3/4 full AROM PROM appears WFL; no tone noted at this time; sensation/proprioception appear WFL, will continue to assess LUE Sensation: WNL LUE Coordination: decreased fine motor, decreased gross motor  Lower Extremity Assessment: LLE deficits/detail RLE Sensation: WNL RLE Coordination: WNL LLE Deficits / Details: 3+/5 hip flexor and 3/5 knee extension/DF. LLE Sensation: WNL LLE Coordination: decreased gross motor    ADLs  Overall ADL's : Needs assistance/impaired Grooming: Minimal assistance, Sitting, Wash/dry face Upper Body Dressing : Minimal assistance, Sitting Upper Body Dressing  Details (indicate cue type and reason): gown Lower Body Dressing: Maximal assistance, Sit to/from stand, Cueing for sequencing Toilet Transfer: Moderate assistance, Ambulation, Rolling walker (2 wheels) Toilet Transfer Details (indicate cue type and reason): simulated, heavy L lateral lean, Step by step vcs for technique Toileting- Clothing Manipulation and Hygiene: Maximal assistance, Sit to/from stand, Cueing for sequencing Functional mobility during ADLs: Moderate assistance, Rolling walker (2 wheels), Cueing for safety, Cueing for sequencing (approx 10' in room with RW, close chair follow)    Mobility  Overal bed mobility: Needs Assistance Bed Mobility: Supine to Sit Supine to sit: Min assist, HOB elevated Sit to supine: Min assist General bed mobility comments: pt was in recliner pre/post session.    Transfers  Overall transfer level: Needs assistance Equipment used: Rolling walker (2 wheels) Transfers: Sit to/from Stand Sit to Stand: Mod assist General transfer comment: Mod assist on first STS however able to progress to min assist by the end of session. performed STS 5 x from recliner surface throughout session    Ambulation / Gait / Stairs / Wheelchair Mobility  Ambulation/Gait Ambulation/Gait assistance: Min assist, Mod assist Gait Distance (Feet): 25 Feet Assistive device: Rolling walker (2 wheels) Gait Pattern/deviations: Step-through pattern, Trunk flexed, Narrow base of support, Decreased weight shift to right, Knee flexed in stance - left General Gait Details: Pt was able to ambulate ~ 25 fy with min-mod assist of one. INcreased time + vcs for posture correction and improved step quality. pt tends to have very Narrow BOS with severe fwd flexed posture. with VCs is able to correct. Gait velocity: decreased    Posture / Balance Balance Overall balance assessment: Needs assistance Sitting-balance support: Feet supported Sitting balance-Leahy Scale: Fair Postural control:  Left lateral lean Standing balance support: Bilateral upper extremity supported, During functional activity, Reliant on assistive device for balance Standing balance-Leahy Scale: Poor Standing balance comment: Pt is high fall risk    Special needs/care consideration    Previous Home Environment  Living Arrangements: Children (Daughter, Idell Pickles and her 2 sons 7 and 58 years old live with him)  Lives With: Family (Family live in his home) Available Help at Discharge: Family, Available 24 hours/day Type of Home: House Home Layout: One level Home Access: Level entry Bathroom Shower/Tub: Health visitor: Standard Bathroom Accessibility: Yes How Accessible: Accessible via walker Home Care Services: No  Discharge Living Setting Plans for Discharge Living Setting: Patient's home (daughter, Idell Pickles and her 2 sons live with him) Type of Home at Discharge: House Discharge Home Layout: One level Discharge Home Access: Level entry Discharge Bathroom Shower/Tub: Walk-in shower Discharge Bathroom Toilet: Standard Discharge Bathroom Accessibility: Yes How Accessible: Accessible via walker Does the patient have any problems  obtaining your medications?: No  Social/Family/Support Systems Patient Roles: Parent Contact Information: daughters/POA Vaughan Basta and Administrator, arts Anticipated Caregiver: Asa Lente and her 2 sons Anticipated Ambulance person Information: see contacts Ability/Limitations of Caregiver: no limitations Caregiver Availability: 24/7 Discharge Plan Discussed with Primary Caregiver: Yes Is Caregiver In Agreement with Plan?: Yes Does Caregiver/Family have Issues with Lodging/Transportation while Pt is in Rehab?: No  Goals Patient/Family Goal for Rehab: min assist with PT and OT, supervision with SLP Expected length of stay: ELOS 10 to 14 days Pt/Family Agrees to Admission and willing to participate: Yes Program Orientation Provided & Reviewed with Pt/Caregiver Including  Roles  & Responsibilities: Yes  Decrease burden of Care through IP rehab admission: n/a  Possible need for SNF placement upon discharge:not anticipated  Patient Condition: This patient's medical and functional status has changed since the consult dated: *** in which the Rehabilitation Physician determined and documented that the patient's condition is appropriate for intensive rehabilitative care in an inpatient rehabilitation facility. See "History of Present Illness" (above) for medical update. Functional changes are: mod assist overall. Patient's medical and functional status update has been discussed with the Rehabilitation physician and patient remains appropriate for inpatient rehabilitation. Will admit to inpatient rehab today.  Preadmission Screen Completed By:  Cleatrice Burke, RN, 11/27/2021 12:57 PM ______________________________________________________________________   Discussed status with Dr. Marland Kitchenon***at *** and received approval for admission today.  Admission Coordinator:  Cleatrice Burke, time***/Date***

## 2021-11-27 NOTE — Progress Notes (Signed)
Inpatient Rehabilitation Admissions Coordinator   I spoke with [name]'s daughter, Kevin [name], by phone and reviewed chart for rehab assessment. We discussed goals and expectations of a possible CIR admit.  [name]'s daughter, Asa Lente and two sons live with [name] . They can provide 24/7 assistance as needed after a CIR admit. She prefers CIR for rehab. Family can provide expected caregiver support that is expected. I will begin insurance Auth with [facility] Medicare for possible CIR admit pending approval. Typically [employer] takes 2 to 3 days for a determination. Dr Ranell Patrick to see [name] today at [facility] for assessment. I will follow up on Monday.Please call me with any questions.   Danne Baxter, RN, MSN Rehab Admissions Coordinator [phone]

## 2021-11-28 DIAGNOSIS — I639 Cerebral infarction, unspecified: Secondary | ICD-10-CM | POA: Diagnosis not present

## 2021-11-28 MED ORDER — AMLODIPINE BESYLATE 10 MG PO TABS
10.0000 mg | ORAL_TABLET | Freq: Every day | ORAL | Status: DC
  Administered 2021-11-28 – 2021-12-02 (×5): 10 mg via ORAL
  Filled 2021-11-28 (×5): qty 1

## 2021-11-28 NOTE — TOC Progression Note (Signed)
Transition of Care [facility]) - Progression Note    Patient Details  Name: Kevin [name] MRN: [id number] Date of Birth: 02-16-1934  Transition of [facility]) CM/SW Contact  Izola Price, RN Phone Number: 11/28/2021, 3:49 PM  Clinical Narrative: 9/30: CIR pending ins authorization from [facility] and will be in touch again on Monday, 11/30/21 per last CIR progress note. Simmie Davies RN CM            Expected Discharge Plan and Services                                                 Social Determinants of Health (SDOH) Interventions    Readmission Risk Interventions     No data to display

## 2021-11-28 NOTE — Progress Notes (Signed)
Physical Therapy Treatment Patient Details Name: Kevin [name] MRN: [id number] DOB: 05-Nov-1933 Today's Date: 11/28/2021   History of Present Illness Pt is an 88 year old male admitted with acute R thalamic ischemic infarct; PMH significant for HTN, CAD, anemia    PT Comments    Pt is continuing to make progress performing bed mobility at supervision level and sit<>transfers at min A level.  Pt continues to require constant cues for midline awareness and posture in sitting and standing and Mod cues for safety with RW and gait mechanics.  Current d/c recommendation to CIR is still appropriate.  Recommendations for follow up therapy are one component of a multi-disciplinary discharge planning process, led by the attending physician.  Recommendations may be updated based on patient status, additional functional criteria and insurance authorization.  Follow Up Recommendations  Acute inpatient rehab (3hours/day)     Assistance Recommended at Discharge Intermittent Supervision/Assistance  Patient can return home with the following A lot of help with bathing/dressing/bathroom   Equipment Recommendations  Other (comment) (defer to next level of care.)    Recommendations for Other Services       Precautions / Restrictions Precautions Precautions: Fall Restrictions Weight Bearing Restrictions: No     Mobility  Bed Mobility Overal bed mobility: Modified Independent Bed Mobility: Supine to Sit       Sit to supine: Supervision   General bed mobility comments: once seated on the EOB cues formidline posture and for feet on the floor.    Transfers Overall transfer level: Needs assistance Equipment used: Rolling walker (2 wheels) Transfers: Sit to/from Stand Sit to Stand: Min guard           General transfer comment: cues for body mechanics to weight shift forward.    Ambulation/Gait Ambulation/Gait assistance: Min assist, Mod assist Gait Distance (Feet): 30 Feet Assistive  device: Rolling walker (2 wheels) Gait Pattern/deviations: Step-through pattern, Trunk flexed, Narrow base of support, Decreased weight shift to right, Knee flexed in stance - left Gait velocity: decreased     General Gait Details: Pt was able to ambulate ~ 9 fy with min-mod assist of one. INcreased time + vcs for posture correction and improved step quality. pt tends to have very Narrow BOS with severe fwd flexed posture. with VCs is able to correct.   Stairs             Wheelchair Mobility    Modified Rankin (Stroke Patients Only)       Balance Overall balance assessment: Needs assistance Sitting-balance support: Feet supported Sitting balance-Leahy Scale: Fair   Postural control: Left lateral lean Standing balance support: Bilateral upper extremity supported, During functional activity, Reliant on assistive device for balance Standing balance-Leahy Scale: Poor Standing balance comment: Pt is high fall risk                            Cognition Arousal/Alertness: Awake/alert Behavior During Therapy: WFL for tasks assessed/performed Overall Cognitive Status: Impaired/Different from baseline Area of Impairment: Problem solving, Awareness                 Orientation Level: Disoriented to, Time Current Attention Level: Sustained   Following Commands: Follows one step commands with increased time Safety/Judgement: Decreased awareness of deficits, Decreased awareness of safety Awareness: Emergent Problem Solving: Slow processing, Requires verbal cues, Requires tactile cues General Comments: Pt lack full insight of deficits and has overall poor safety awareness. He is however able  to follow commands consistently with increased processing time. Is oriented        Exercises Other Exercises Other Exercises: Marching in place working on posture, hip0 flexor strength,  and postural awareness.    General Comments General comments (skin integrity, edema,  etc.): Educated pt and family on importance of not getting up without nursing assistance due to high fall risk.      Pertinent Vitals/Pain Pain Assessment Pain Score: 0-No pain    Home Living                          Prior Function            PT Goals (current goals can now be found in the care plan section) Acute Rehab PT Goals Patient Stated Goal: "Get back home and walk right again." PT Goal Formulation: With patient Time For Goal Achievement: 12/10/21 Potential to Achieve Goals: Good Progress towards PT goals: Progressing toward goals    Frequency    7X/week      PT Plan Current plan remains appropriate    Co-evaluation     PT goals addressed during session: Mobility/safety with mobility;Balance        AM-PAC PT "6 Clicks" Mobility   Outcome Measure  Help needed turning from your back to your side while in a flat bed without using bedrails?: None Help needed moving from lying on your back to sitting on the side of a flat bed without using bedrails?: A Little Help needed moving to and from a bed to a chair (including a wheelchair)?: A Lot Help needed standing up from a chair using your arms (e.g., wheelchair or bedside chair)?: A Little Help needed to walk in hospital room?: A Lot Help needed climbing 3-5 steps with a railing? : A Lot 6 Click Score: 16    End of Session Equipment Utilized During Treatment: Gait belt Activity Tolerance: Patient tolerated treatment well Patient left: in chair;with call bell/phone within reach;with family/visitor present Nurse Communication: Mobility status PT Visit Diagnosis: Unsteadiness on feet (R26.81);Muscle weakness (generalized) (M62.81);Difficulty in walking, not elsewhere classified (R26.2);Hemiplegia and hemiparesis Hemiplegia - Right/Left: Left Hemiplegia - dominant/non-dominant: Non-dominant Hemiplegia - caused by: Cerebral infarction     Time: 1025-1055 PT Time Calculation (min) (ACUTE ONLY): 30  min  Charges:  $Gait Training: 8-22 mins $Therapeutic Activity: 8-22 mins                    Hortencia Conradi, PTA  11/28/21, 12:11 PM

## 2021-11-28 NOTE — Progress Notes (Signed)
PROGRESS NOTE    KERNEY HOPFENSPERGER  WNI:[id number] DOB: February 15, 1934 DOA: 11/25/2021 PCP: Leonel Ramsay, MD    Brief Narrative:  JULES BATY is a 88 y.o. male with medical history significant of  HTN, CAD non obstructive, Anemia  who presents to ED with complaint of left sided weakness . Per patient this am on  waking around 1 am he felt he was not able to move his arm or left leg. He states he attempted to ambulate and fell.  He notes no associated HA, dizzy , chest pain , slurred speech, confusion, difficulty swallowing or prior symptoms like this in the past. ON further ros no fever/ chills/ n/v/cough or sob.    ED Course:  Vitals: Afeb, hr 68, rr 18, sat 100% on ra  CODE CVA CTH:. No evidence of acute intracranial abnormality. CTA neck:1. No emergent large vessel occlusion or proximal hemodynamically significant stenosis. 2. No evidence of core infarct or penumbra on CT perfusion.   MRI Acute infarct right lateral thalamus    MRI spine Lumbar spondylosis. Multilevel spinal and subarticular stenosis  Mild thoracic degenerative change most prominent at T10-11. No significant stenosis     9/28 feels his left legs again. No sob , cp, dizziness, new numbness. 9/29 daughter at bedside. No acute issues 9/30 reported with PT today  Consultants:  neurology  Procedures:   Antimicrobials:      Subjective: No complaints.  Objective: Vitals:   11/28/21 0456 11/28/21 0659 11/28/21 0748 11/28/21 1220  BP: (!) 177/84 (!) 170/96 (!) 165/84 (!) 143/90  Pulse: 67  65 65  Resp: 20  16 16   Temp: 98.6 F (37 C) 98.7 F (37.1 C) 99.2 F (37.3 C) 98.4 F (36.9 C)  TempSrc: Oral Oral    SpO2: 100% 100% 100% 100%  Weight:      Height:        Intake/Output Summary (Last 24 hours) at 11/28/2021 1443 Last data filed at 11/28/2021 1100 Gross per 24 hour  Intake 120 ml  Output 1000 ml  Net -880 ml   Filed Weights   11/25/21 1219 11/26/21 1753  Weight: 86.2 kg 73.7 kg     Examination: Calm, NAD Cta no w/r Reg s1/s2 no gallop Soft benign +bs No edema Awake and alert. Standing up with PT. Mood and affect appropriate in current setting     Data Reviewed: I have personally reviewed following labs and imaging studies  CBC: Recent Labs  Lab 11/25/21 1435  WBC 4.6  NEUTROABS 3.0  HGB 12.1*  HCT 37.7*  MCV 85.1  PLT 381   Basic Metabolic Panel: Recent Labs  Lab 11/25/21 1435  NA 139  K 3.6  CL 107  CO2 22  GLUCOSE 97  BUN 18  CREATININE 0.92  CALCIUM 8.6*   GFR: Estimated Creatinine Clearance: 60.1 mL/min (by C-G formula based on SCr of 0.92 mg/dL). Liver Function Tests: Recent Labs  Lab 11/25/21 1435  AST 20  ALT 18  ALKPHOS 60  BILITOT 0.5  PROT 7.2  ALBUMIN 3.4*   No results for input(s): "LIPASE", "AMYLASE" in the last 168 hours. No results for input(s): "AMMONIA" in the last 168 hours. Coagulation Profile: Recent Labs  Lab 11/25/21 1435  INR 1.1   Cardiac Enzymes: No results for input(s): "CKTOTAL", "CKMB", "CKMBINDEX", "TROPONINI" in the last 168 hours. BNP (last 3 results) No results for input(s): "PROBNP" in the last 8760 hours. HbA1C: No results for input(s): "HGBA1C" in the last  72 hours.  CBG: Recent Labs  Lab 11/25/21 1210  GLUCAP 99   Lipid Profile: Recent Labs    12-10-2021 0459  CHOL 146  HDL 31*  LDLCALC 98  TRIG 84  CHOLHDL 4.7   Thyroid Function Tests: No results for input(s): "TSH", "T4TOTAL", "FREET4", "T3FREE", "THYROIDAB" in the last 72 hours. Anemia Panel: Recent Labs    11/25/21 2015 2021-12-10 0459  VITAMINB12  --  309  FOLATE 17.7  --    Sepsis Labs: No results for input(s): "PROCALCITON", "LATICACIDVEN" in the last 168 hours.  Recent Results (from the past 240 hour(s))  Resp Panel by RT-PCR (Flu A&B, Covid) Anterior Nasal Swab     Status: None   Collection Time: 11/25/21  2:35 PM   Specimen: Anterior Nasal Swab  Result Value Ref Range Status   SARS Coronavirus 2 by  RT PCR NEGATIVE NEGATIVE Final    Comment: (NOTE) SARS-CoV-2 target nucleic acids are NOT DETECTED.  The SARS-CoV-2 RNA is generally detectable in upper respiratory specimens during the acute phase of infection. The lowest concentration of SARS-CoV-2 viral copies this assay can detect is 138 copies/mL. A negative result does not preclude SARS-Cov-2 infection and should not be used as the sole basis for treatment or other patient management decisions. A negative result may occur with  improper specimen collection/handling, submission of specimen other than nasopharyngeal swab, presence of viral mutation(s) within the areas targeted by this assay, and inadequate number of viral copies(<138 copies/mL). A negative result must be combined with clinical observations, patient history, and epidemiological information. The expected result is Negative.  Fact Sheet for Patients:  BloggerCourse.com  Fact Sheet for Healthcare Providers:  SeriousBroker.it  This test is no t yet approved or cleared by the [place] FDA and  has been authorized for detection and/or diagnosis of SARS-CoV-2 by FDA under an Emergency Use Authorization (EUA). This EUA will remain  in effect (meaning this test can be used) for the duration of the COVID-19 declaration under Section 564(b)(1) of the Act, 21 U.S.C.section 360bbb-3(b)(1), unless the authorization is terminated  or revoked sooner.       Influenza A by PCR NEGATIVE NEGATIVE Final   Influenza B by PCR NEGATIVE NEGATIVE Final    Comment: (NOTE) The Xpert Xpress SARS-CoV-2/FLU/RSV plus assay is intended as an aid in the diagnosis of influenza from Nasopharyngeal swab specimens and should not be used as a sole basis for treatment. Nasal washings and aspirates are unacceptable for Xpert Xpress SARS-CoV-2/FLU/RSV testing.  Fact Sheet for Patients: BloggerCourse.com  Fact Sheet  for Healthcare Providers: SeriousBroker.it  This test is not yet approved or cleared by the [place] FDA and has been authorized for detection and/or diagnosis of SARS-CoV-2 by FDA under an Emergency Use Authorization (EUA). This EUA will remain in effect (meaning this test can be used) for the duration of the COVID-19 declaration under Section 564(b)(1) of the Act, 21 U.S.C. section 360bbb-3(b)(1), unless the authorization is terminated or revoked.  Performed at [facility], [address] [place], [place] [address]          Radiology Studies: EEG adult  Result Date: 12/10/21 Jefferson Fuel, MD     12-10-21  3:48 PM Routine EEG Report Kevin [name] is a 88 y.o. male with a history of altered mental status who is undergoing an EEG to evaluate for seizures. Report: This EEG was acquired with electrodes placed according to the International 10-20 electrode system (including Fp1, Fp2, F3,  F4, C3, C4, P3, P4, O1, O2, T3, T4, T5, T6, A1, A2, Fz, Cz, Pz). The following electrodes were missing or displaced: none. The occipital dominant rhythm was 9 Hz. This activity is reactive to stimulation. Drowsiness was manifested by background fragmentation; deeper stages of sleep were identified by K complexes and sleep spindles. There was no focal slowing. There were no interictal epileptiform discharges. There were no electrographic seizures identified. Photic stimulation and hyperventilation were not performed. Impression: This EEG was obtained while awake and asleep and is normal.   Clinical Correlation: Normal EEGs, however, do not rule out epilepsy. Bing Neighbors, MD [facility] [phone] If 7pm- 7am, please page neurology on call as listed in AMION.        Scheduled Meds:  aspirin  81 mg Oral Daily   atorvastatin  40 mg Oral QPM   clopidogrel  75 mg Oral Daily   heparin  5,000 Units Subcutaneous Q8H   hydrocerin   Topical BID    Continuous Infusions:    Assessment & Plan:   Principal Problem:   Acute CVA (cerebrovascular accident) (HCC) Active Problems:   Essential hypertension   CAD (coronary artery disease)   Acute infarct right lateral thalamus -persistent left sided weakness  -MRI head  + for right thalamic cva -admit tia/cva r/o protocol -Permissive HTN x48 hours from sx onset  -goal BP < 220/110, PRN above these parameters -  ASA 81mg  daily + plavix 75mg  daily x21 days f/b ASA 81mg  daily monotherapy after that -high intensity statin -neuro checks  9/28 EEG nml.  Stroke education Amb referral to neurology upon discharge. Stat head Ct for any changes in neuro exam LDL 98, goal <70 9/30 acute rehab pending        HTN -permissive HTN x 48 hours ,hold oral medications for now  9/30 we will start amlodipine 10mg  home dose Iv hydralazine prn    CAD  -no active issues 9/30 asx    Anemia At baseline    Recent Oral surgery -full mouth teeth extraction  -complete post procedure out patient Abx              DVT prophylaxis: Heparin Code Status: Full Family Communication: None at bedside Disposition Plan: inpt acute rehab Status is: Inpatient Remains inpatient appropriate because: unsafe dc, rehab pending auth.         LOS: 3 days   Time spent: 10/30    10/30, MD [facility] Pager [phone]  If 7PM-7AM, please contact night-coverage 11/28/2021, 2:43 PM

## 2021-11-29 DIAGNOSIS — I639 Cerebral infarction, unspecified: Secondary | ICD-10-CM | POA: Diagnosis not present

## 2021-11-29 MED ORDER — ALUM & MAG HYDROXIDE-SIMETH 200-200-20 MG/5ML PO SUSP
30.0000 mL | Freq: Four times a day (QID) | ORAL | Status: DC | PRN
  Administered 2021-11-29: 30 mL via ORAL
  Filled 2021-11-29: qty 30

## 2021-11-29 MED ORDER — HYDRALAZINE HCL 10 MG PO TABS
10.0000 mg | ORAL_TABLET | Freq: Three times a day (TID) | ORAL | Status: DC
  Administered 2021-11-29 – 2021-12-01 (×7): 10 mg via ORAL
  Filled 2021-11-29 (×7): qty 1

## 2021-11-29 NOTE — Plan of Care (Signed)

## 2021-11-29 NOTE — Plan of Care (Signed)
Reviewed with patient

## 2021-11-29 NOTE — Progress Notes (Signed)
PROGRESS NOTE    Kevin [name]  [id number]:[id number] DOB: January 16, 1934 DOA: 11/25/2021 PCP: Leonel Ramsay, MD    Brief Narrative:  Kevin [name] is a 88 y.o. male with medical history significant of  HTN, CAD non obstructive, Anemia  who presents to ED with complaint of left sided weakness . Per patient this am on  waking around 1 am he felt he was not able to move his arm or left leg. He states he attempted to ambulate and fell.  He notes no associated HA, dizzy , chest pain , slurred speech, confusion, difficulty swallowing or prior symptoms like this in the past. ON further ros no fever/ chills/ n/v/cough or sob.    ED Course:  Vitals: Afeb, hr 68, rr 18, sat 100% on ra  CODE CVA CTH:. No evidence of acute intracranial abnormality. CTA neck:1. No emergent large vessel occlusion or proximal hemodynamically significant stenosis. 2. No evidence of core infarct or penumbra on CT perfusion.   MRI Acute infarct right lateral thalamus    MRI spine Lumbar spondylosis. Multilevel spinal and subarticular stenosis  Mild thoracic degenerative change most prominent at T10-11. No significant stenosis     9/28 feels his left legs again. No sob , cp, dizziness, new numbness. 9/29 daughter at bedside. No acute issues 9/30 reported with PT today 10/1 no issues.   Consultants:  neurology  Procedures:   Antimicrobials:      Subjective: Denies weakness, numbness, shortness of breath or dizziness   Objective: Vitals:   11/29/21 0049 11/29/21 0332 11/29/21 0749 11/29/21 1146  BP: (!) 149/75  (!) 140/76 (!) 154/73  Pulse: 64  60 (!) 58  Resp: 18  16 16   Temp: 98.1 F (36.7 C)  97.9 F (36.6 C) 97.8 F (36.6 C)  TempSrc:      SpO2: 100%  100% 100%  Weight:  73.5 kg    Height:  6\' 2"  (1.88 m)      Intake/Output Summary (Last 24 hours) at 11/29/2021 1303 Last data filed at 11/29/2021 0600 Gross per 24 hour  Intake --  Output 1050 ml  Net -1050 ml   Filed Weights   11/25/21  1219 11/26/21 1753 11/29/21 0332  Weight: 86.2 kg 73.7 kg 73.5 kg    Examination: Calm, NAD Cta no w/r Reg s1/s2 no gallop Soft benign +bs No edema Awake and alert. 5/5 strength x4 Mood and affect appropriate in current setting     Data Reviewed: I have personally reviewed following labs and imaging studies  CBC: Recent Labs  Lab 11/25/21 1435  WBC 4.6  NEUTROABS 3.0  HGB 12.1*  HCT 37.7*  MCV 85.1  PLT 166   Basic Metabolic Panel: Recent Labs  Lab 11/25/21 1435  NA 139  K 3.6  CL 107  CO2 22  GLUCOSE 97  BUN 18  CREATININE 0.92  CALCIUM 8.6*   GFR: Estimated Creatinine Clearance: 59.9 mL/min (by C-G formula based on SCr of 0.92 mg/dL). Liver Function Tests: Recent Labs  Lab 11/25/21 1435  AST 20  ALT 18  ALKPHOS 60  BILITOT 0.5  PROT 7.2  ALBUMIN 3.4*   No results for input(s): "LIPASE", "AMYLASE" in the last 168 hours. No results for input(s): "AMMONIA" in the last 168 hours. Coagulation Profile: Recent Labs  Lab 11/25/21 1435  INR 1.1   Cardiac Enzymes: No results for input(s): "CKTOTAL", "CKMB", "CKMBINDEX", "TROPONINI" in the last 168 hours. BNP (last 3 results) No results for input(s): "PROBNP"  in the last 8760 hours. HbA1C: No results for input(s): "HGBA1C" in the last 72 hours.  CBG: Recent Labs  Lab 11/25/21 1210  GLUCAP 99   Lipid Profile: No results for input(s): "CHOL", "HDL", "LDLCALC", "TRIG", "CHOLHDL", "LDLDIRECT" in the last 72 hours.  Thyroid Function Tests: No results for input(s): "TSH", "T4TOTAL", "FREET4", "T3FREE", "THYROIDAB" in the last 72 hours. Anemia Panel: No results for input(s): "VITAMINB12", "FOLATE", "FERRITIN", "TIBC", "IRON", "RETICCTPCT" in the last 72 hours.  Sepsis Labs: No results for input(s): "PROCALCITON", "LATICACIDVEN" in the last 168 hours.  Recent Results (from the past 240 hour(s))  Resp Panel by RT-PCR (Flu A&B, Covid) Anterior Nasal Swab     Status: None   Collection Time: 11/25/21   2:35 PM   Specimen: Anterior Nasal Swab  Result Value Ref Range Status   SARS Coronavirus 2 by RT PCR NEGATIVE NEGATIVE Final    Comment: (NOTE) SARS-CoV-2 target nucleic acids are NOT DETECTED.  The SARS-CoV-2 RNA is generally detectable in upper respiratory specimens during the acute phase of infection. The lowest concentration of SARS-CoV-2 viral copies this assay can detect is 138 copies/mL. A negative result does not preclude SARS-Cov-2 infection and should not be used as the sole basis for treatment or other patient management decisions. A negative result may occur with  improper specimen collection/handling, submission of specimen other than nasopharyngeal swab, presence of viral mutation(s) within the areas targeted by this assay, and inadequate number of viral copies(<138 copies/mL). A negative result must be combined with clinical observations, patient history, and epidemiological information. The expected result is Negative.  Fact Sheet for Patients:  EntrepreneurPulse.com.au  Fact Sheet for Healthcare Providers:  IncredibleEmployment.be  This test is no t yet approved or cleared by the [place] FDA and  has been authorized for detection and/or diagnosis of SARS-CoV-2 by FDA under an Emergency Use Authorization (EUA). This EUA will remain  in effect (meaning this test can be used) for the duration of the COVID-19 declaration under Section 564(b)(1) of the Act, 21 U.S.C.section 360bbb-3(b)(1), unless the authorization is terminated  or revoked sooner.       Influenza A by PCR NEGATIVE NEGATIVE Final   Influenza B by PCR NEGATIVE NEGATIVE Final    Comment: (NOTE) The Xpert Xpress SARS-CoV-2/FLU/RSV plus assay is intended as an aid in the diagnosis of influenza from Nasopharyngeal swab specimens and should not be used as a sole basis for treatment. Nasal washings and aspirates are unacceptable for Xpert Xpress  SARS-CoV-2/FLU/RSV testing.  Fact Sheet for Patients: EntrepreneurPulse.com.au  Fact Sheet for Healthcare Providers: IncredibleEmployment.be  This test is not yet approved or cleared by the [place] FDA and has been authorized for detection and/or diagnosis of SARS-CoV-2 by FDA under an Emergency Use Authorization (EUA). This EUA will remain in effect (meaning this test can be used) for the duration of the COVID-19 declaration under Section 564(b)(1) of the Act, 21 U.S.C. section 360bbb-3(b)(1), unless the authorization is terminated or revoked.  Performed at [facility], [address] [place], [place] [address]          Radiology Studies: No results found.      Scheduled Meds:  amLODipine  10 mg Oral Daily   aspirin  81 mg Oral Daily   atorvastatin  40 mg Oral QPM   clopidogrel  75 mg Oral Daily   heparin  5,000 Units Subcutaneous Q8H   hydrocerin   Topical BID   Continuous Infusions:    Assessment &  Plan:   Principal Problem:   Acute CVA (cerebrovascular accident) [facility]) Active Problems:   Essential hypertension   CAD (coronary artery disease)   Acute infarct right lateral thalamus -persistent left sided weakness  -MRI head  + for right thalamic cva -admit tia/cva r/o protocol -Permissive HTN x48 hours from sx onset  -goal BP < 220/110, PRN above these parameters -  ASA 81mg  daily + plavix 75mg  daily x21 days f/b ASA 81mg  daily monotherapy after that -high intensity statin -neuro checks  9/28 EEG nml.  Stroke education Amb referral to neurology upon discharge. Stat head Ct for any changes in neuro exam LDL 98, goal <70 10/1 acute rehab pending  No new neurologic findings Continue to monitor     HTN -permissive HTN x 48 hours ,hold oral medications for now  9/30 we will start amlodipine 10mg  home dose Iv hydralazine prn 10/1 has room for improvement we will add hydralazine 10 mg 3 times  daily    CAD  -no active issues 10/1 asymptomatic    Anemia At baseline     Recent Oral surgery -full mouth teeth extraction  -complete post procedure out patient Abx              DVT prophylaxis: Heparin Code Status: Full Family Communication: None at bedside Disposition Plan: inpt acute rehab Status is: Inpatient Remains inpatient appropriate because: unsafe dc, rehab pending auth.         LOS: 4 days   Time spent: 26min    Nolberto Hanlon, MD [facility] Pager [phone]  If 7PM-7AM, please contact night-coverage 11/29/2021, 1:03 PM

## 2021-11-29 NOTE — Progress Notes (Signed)
Physical Therapy Treatment Patient Details Name: Kevin [name] MRN: [id number] DOB: 07-02-1933 Today's Date: 11/29/2021   History of Present Illness Pt is an 88 year old male admitted with acute R thalamic ischemic infarct; PMH significant for HTN, CAD, anemia    PT Comments    Pt ready for session.  External cath had come off and pt saturated in urine and seemed unaware.  To EOB with min guard.  Stood to [employer] with min a x 1 from elevated bed.  Needs assist to come fully upright and transition to walker due to balance.  Once up he is able to walk 15' x 2 with RW and min a x 1 with mod verbal cues to fully advance LLE before advancing RLE to prevent excessive forward lean increasing fall risk due to inability to recover.  Gait is limited by writer due to safety with only +1 assist available but daughter is able to provide recliner follow.  Standing ex x 10 with rail support, 10 x sit to stand with emphasis on hand placement and full transition to standing to walker.  As he fatigues he does need progressively increasing assist.    Overall highly motivated during session and asks what's next frequently.  Global weakness and safety awareness remain barriers along with unequal step length and height and excessive forward lean on walker at times.  Pt has supportive daughter who is involved in care during session.  CIR remains appropriate.   Recommendations for follow up therapy are one component of a multi-disciplinary discharge planning process, led by the attending physician.  Recommendations may be updated based on patient status, additional functional criteria and insurance authorization.  Follow Up Recommendations  Acute inpatient rehab (3hours/day)     Assistance Recommended at Discharge Intermittent Supervision/Assistance  Patient can return home with the following A lot of help with walking and/or transfers;A lot of help with bathing/dressing/bathroom;Assistance with cooking/housework;Help with  stairs or ramp for entrance;Assist for transportation   Equipment Recommendations   (defer to next level of care.)    Recommendations for Other Services       Precautions / Restrictions Precautions Precautions: Fall Restrictions Weight Bearing Restrictions: No     Mobility  Bed Mobility Overal bed mobility: Needs Assistance Bed Mobility: Supine to Sit     Supine to sit: Min guard          Transfers Overall transfer level: Needs assistance Equipment used: Rolling walker (2 wheels) Transfers: Sit to/from Stand Sit to Stand: Mod assist, Min assist           General transfer comment: needs mod assist from lower surfaces and as he fatigued during session.  min guard from elevated bed height but needs assist for balance during transition    Ambulation/Gait Ambulation/Gait assistance: Min assist, Mod assist Gait Distance (Feet): 15 Feet Assistive device: Rolling walker (2 wheels) Gait Pattern/deviations: Step-through pattern, Trunk flexed, Narrow base of support, Decreased weight shift to right, Knee flexed in stance - left, Step-to pattern, Decreased step length - left Gait velocity: decreased     General Gait Details: 15' x 2 with RW and min a x 1 with cues to fully advance LLE and to keep walker closer to his body   Stairs             Wheelchair Mobility    Modified Rankin (Stroke Patients Only)       Balance Overall balance assessment: Needs assistance Sitting-balance support: Feet supported Sitting balance-Leahy Scale: Fair  Postural control: Left lateral lean Standing balance support: Bilateral upper extremity supported, During functional activity, Reliant on assistive device for balance Standing balance-Leahy Scale: Poor Standing balance comment: Pt is high fall risk                            Cognition Arousal/Alertness: Awake/alert Behavior During Therapy: WFL for tasks assessed/performed Overall Cognitive Status:  Impaired/Different from baseline Area of Impairment: Problem solving, Awareness                 Orientation Level: Disoriented to, Time Current Attention Level: Sustained   Following Commands: Follows one step commands with increased time Safety/Judgement: Decreased awareness of deficits, Decreased awareness of safety Awareness: Emergent Problem Solving: Slow processing, Requires verbal cues, Requires tactile cues General Comments: Pt lack full insight of deficits and has overall poor safety awareness. He is however able to follow commands consistently with increased processing time. Is oriented        Exercises Other Exercises Other Exercises: standing exercises x 10 with rail for support,  10 x sit to stand with emphasis on hand placements and balance once fully upright    General Comments        Pertinent Vitals/Pain Pain Assessment Pain Assessment: No/denies pain    Home Living                          Prior Function            PT Goals (current goals can now be found in the care plan section) Progress towards PT goals: Progressing toward goals    Frequency    7X/week      PT Plan Current plan remains appropriate    Co-evaluation              AM-PAC PT "6 Clicks" Mobility   Outcome Measure  Help needed turning from your back to your side while in a flat bed without using bedrails?: None Help needed moving from lying on your back to sitting on the side of a flat bed without using bedrails?: A Little Help needed moving to and from a bed to a chair (including a wheelchair)?: A Lot Help needed standing up from a chair using your arms (e.g., wheelchair or bedside chair)?: A Lot Help needed to walk in hospital room?: A Lot Help needed climbing 3-5 steps with a railing? : A Lot 6 Click Score: 15    End of Session Equipment Utilized During Treatment: Gait belt Activity Tolerance: Patient tolerated treatment well Patient left: in  chair;with call bell/phone within reach;with family/visitor present Nurse Communication: Mobility status PT Visit Diagnosis: Unsteadiness on feet (R26.81);Muscle weakness (generalized) (M62.81);Difficulty in walking, not elsewhere classified (R26.2);Hemiplegia and hemiparesis Hemiplegia - Right/Left: Left Hemiplegia - dominant/non-dominant: Non-dominant Hemiplegia - caused by: Cerebral infarction     Time: 2774-1287 PT Time Calculation (min) (ACUTE ONLY): 27 min  Charges:  $Gait Training: 8-22 mins $Therapeutic Exercise: 8-22 mins                   Chesley Noon, PTA 11/29/21, 10:58 AM

## 2021-11-30 DIAGNOSIS — I639 Cerebral infarction, unspecified: Secondary | ICD-10-CM | POA: Diagnosis not present

## 2021-11-30 MED ORDER — POLYETHYLENE GLYCOL 3350 17 G PO PACK
17.0000 g | PACK | Freq: Two times a day (BID) | ORAL | Status: DC
  Administered 2021-11-30 – 2021-12-02 (×5): 17 g via ORAL
  Filled 2021-11-30 (×5): qty 1

## 2021-11-30 MED ORDER — SENNOSIDES-DOCUSATE SODIUM 8.6-50 MG PO TABS
1.0000 | ORAL_TABLET | Freq: Every day | ORAL | Status: DC
  Administered 2021-11-30 – 2021-12-01 (×2): 1 via ORAL
  Filled 2021-11-30 (×2): qty 1

## 2021-11-30 NOTE — Progress Notes (Signed)
  Inpatient Rehabilitation Admissions Coordinator   I have received insurance approval from [facility] to admit to [facility] CIR/inpatient at [facility] in [place], but no bed available today. I contacted his daughter, Annie Main and she is aware and in agreement. We reviewed Cost of care. I will alert acute team and TOC. I am hopeful for bed in the next couple of days.  Danne Baxter, RN, MSN Rehab Admissions Coordinator [phone] 11/30/2021 11:14 AM

## 2021-11-30 NOTE — Progress Notes (Signed)
Physical Therapy Treatment Patient Details Name: Kevin [name] MRN: [id number] DOB: [birth year] Today's Date: 11/30/2021   History of Present Illness Pt is an 88 year old male admitted with acute R thalamic ischemic infarct; PMH significant for HTN, CAD, anemia    PT Comments    In bed, ready for session.  To EOB with min guard.  Steady in sitting.  Stood with min a x 1 from elevated surface and is able to walk 50' x 2 with RW and min a x 1 with mod cues to fully advance LLE, stand upright, correct walker position as he will keep it too close and advance L LE under bar without intervention, and to keep hands on walker at all times.  Stops during gait to pull up sock and is cued to stop due to fall risk.  As pt fatigues, gait quality does decrease with less consistency advancing L foot and more slouched L leaning posture.  Exercises and transfers were focused on during second half of session.  Overall progressing with goals and remains highly motivated during sessions with strong family support.   Recommendations for follow up therapy are one component of a multi-disciplinary discharge planning process, led by the attending physician.  Recommendations may be updated based on patient status, additional functional criteria and insurance authorization.  Follow Up Recommendations  Acute inpatient rehab (3hours/day)     Assistance Recommended at Discharge Intermittent Supervision/Assistance  Patient can return home with the following Assistance with cooking/housework;Help with stairs or ramp for entrance;Assist for transportation;A little help with walking and/or transfers;A little help with bathing/dressing/bathroom   Equipment Recommendations   (defer to next level of care.)    Recommendations for Other Services       Precautions / Restrictions Precautions Precautions: Fall Restrictions Weight Bearing Restrictions: No     Mobility  Bed Mobility               General bed  mobility comments: in chair before and after    Transfers Overall transfer level: Needs assistance Equipment used: Rolling walker (2 wheels)   Sit to Stand: Min assist           General transfer comment: more consistant hand placements today with transfers but still needs cues    Ambulation/Gait Ambulation/Gait assistance: Min assist, Mod assist Gait Distance (Feet): 50 Feet Assistive device: Rolling walker (2 wheels) Gait Pattern/deviations: Step-through pattern, Trunk flexed, Narrow base of support, Decreased weight shift to right, Knee flexed in stance - left, Step-to pattern, Decreased step length - left Gait velocity: decreased     General Gait Details: 50' x 2 with RW and min a x 1 with mod cues for step patterns and to advance LLE fully.  also cues to stand upright and keep walker further in front of him   Stairs             Wheelchair Mobility    Modified Rankin (Stroke Patients Only)       Balance Overall balance assessment: Needs assistance Sitting-balance support: Feet supported Sitting balance-Leahy Scale: Fair     Standing balance support: Bilateral upper extremity supported, During functional activity, Reliant on assistive device for balance Standing balance-Leahy Scale: Poor Standing balance comment: Pt is high fall risk                            Cognition Arousal/Alertness: Awake/alert Behavior During Therapy: WFL for tasks assessed/performed Overall Cognitive Status: Within  Functional Limits for tasks assessed                                 General Comments: Pt lack full insight of deficits and has overall poor safety awareness. He is however able to follow commands consistently with increased processing time. Is oriented        Exercises Other Exercises Other Exercises: 5 x sit to stand with emphasis on hand placements and standing fully each attempt.  seated and standing ex with walker support x 10     General Comments        Pertinent Vitals/Pain Pain Assessment Pain Assessment: No/denies pain    Home Living                          Prior Function            PT Goals (current goals can now be found in the care plan section) Progress towards PT goals: Progressing toward goals    Frequency    7X/week      PT Plan Current plan remains appropriate    Co-evaluation              AM-PAC PT "6 Clicks" Mobility   Outcome Measure  Help needed turning from your back to your side while in a flat bed without using bedrails?: None Help needed moving from lying on your back to sitting on the side of a flat bed without using bedrails?: A Little Help needed moving to and from a bed to a chair (including a wheelchair)?: A Little Help needed standing up from a chair using your arms (e.g., wheelchair or bedside chair)?: A Little Help needed to walk in hospital room?: A Lot Help needed climbing 3-5 steps with a railing? : A Lot 6 Click Score: 17    End of Session Equipment Utilized During Treatment: Gait belt Activity Tolerance: Patient tolerated treatment well Patient left: in chair;with call bell/phone within reach;with family/visitor present Nurse Communication: Mobility status PT Visit Diagnosis: Unsteadiness on feet (R26.81);Muscle weakness (generalized) (M62.81);Difficulty in walking, not elsewhere classified (R26.2);Hemiplegia and hemiparesis Hemiplegia - Right/Left: Left Hemiplegia - dominant/non-dominant: Non-dominant Hemiplegia - caused by: Cerebral infarction     Time: 9470-9628 PT Time Calculation (min) (ACUTE ONLY): 26 min  Charges:  $Gait Training: 8-22 mins $Therapeutic Exercise: 8-22 mins                   Chesley Noon, PTA 11/30/21, 11:34 AM

## 2021-11-30 NOTE — Progress Notes (Addendum)
Kevin [name] PROGRESS NOTE    [facility] AMISON  JJK:[id number] DOB: May 12, 1933 DOA: 11/25/2021 PCP: Mick Sell, MD    Brief Narrative:  Kevin [name] is a 88 y.o. male with medical history significant of  HTN, CAD non obstructive, Anemia  who presents to ED with complaint of left sided weakness . Per patient this am on  waking around 1 am he felt he was not able to move his arm or left leg. He states he attempted to ambulate and fell.  He notes no associated HA, dizzy , chest pain , slurred speech, confusion, difficulty swallowing or prior symptoms like this in the past. ON further ros no fever/ chills/ n/v/cough or sob.    ED Course:  Vitals: Afeb, hr 68, rr 18, sat 100% on ra  CODE CVA CTH:. No evidence of acute intracranial abnormality. CTA neck:1. No emergent large vessel occlusion or proximal hemodynamically significant stenosis. 2. No evidence of core infarct or penumbra on CT perfusion.   MRI Acute infarct right lateral thalamus    MRI spine Lumbar spondylosis. Multilevel spinal and subarticular stenosis  Mild thoracic degenerative change most prominent at T10-11. No significant stenosis     9/28 feels his left legs again. No sob , cp, dizziness, new numbness. 9/29 daughter at bedside. No acute issues 9/30 reported with PT today 10/2 sitting in bed, no issues  Consultants:  neurology  Procedures:   Antimicrobials:      Subjective: Denies shortness of breath, chest pain dizziness or any new neurologic symptoms   Objective: Vitals:   11/29/21 2359 11/30/21 0545 11/30/21 0743 11/30/21 1130  BP: (!) 142/75 124/77 135/75 130/69  Pulse: 64 64 63 86  Resp:   16 16  Temp: 97.6 F (36.4 C) 98.1 F (36.7 C) 98 F (36.7 C) 98.2 F (36.8 C)  TempSrc: Oral Oral    SpO2: 97% 100% 100% 97%  Weight:      Height:        Intake/Output Summary (Last 24 hours) at 11/30/2021 1423 Last data filed at 11/30/2021 1023 Gross per 24 hour  Intake 240 ml  Output 1150 ml  Net  -910 ml   Filed Weights   11/25/21 1219 11/26/21 1753 11/29/21 0332  Weight: 86.2 kg 73.7 kg 73.5 kg    Examination: Calm, NAD Cta no w/r Reg s1/s2 no gallop Soft benign +bs No edema Awake and alert grossly intact Mood and affect appropriate in current setting     Data Reviewed: I have personally reviewed following labs and imaging studies  CBC: Recent Labs  Lab 11/25/21 1435  WBC 4.6  NEUTROABS 3.0  HGB 12.1*  HCT 37.7*  MCV 85.1  PLT 265   Basic Metabolic Panel: Recent Labs  Lab 11/25/21 1435  NA 139  K 3.6  CL 107  CO2 22  GLUCOSE 97  BUN 18  CREATININE 0.92  CALCIUM 8.6*   GFR: Estimated Creatinine Clearance: 59.9 mL/min (by C-G formula based on SCr of 0.92 mg/dL). Liver Function Tests: Recent Labs  Lab 11/25/21 1435  AST 20  ALT 18  ALKPHOS 60  BILITOT 0.5  PROT 7.2  ALBUMIN 3.4*   No results for input(s): "LIPASE", "AMYLASE" in the last 168 hours. No results for input(s): "AMMONIA" in the last 168 hours. Coagulation Profile: Recent Labs  Lab 11/25/21 1435  INR 1.1   Cardiac Enzymes: No results for input(s): "CKTOTAL", "CKMB", "CKMBINDEX", "TROPONINI" in the last 168 hours. BNP (last 3 results) No results  for input(s): "PROBNP" in the last 8760 hours. HbA1C: No results for input(s): "HGBA1C" in the last 72 hours.  CBG: Recent Labs  Lab 11/25/21 1210  GLUCAP 99   Lipid Profile: No results for input(s): "CHOL", "HDL", "LDLCALC", "TRIG", "CHOLHDL", "LDLDIRECT" in the last 72 hours.  Thyroid Function Tests: No results for input(s): "TSH", "T4TOTAL", "FREET4", "T3FREE", "THYROIDAB" in the last 72 hours. Anemia Panel: No results for input(s): "VITAMINB12", "FOLATE", "FERRITIN", "TIBC", "IRON", "RETICCTPCT" in the last 72 hours.  Sepsis Labs: No results for input(s): "PROCALCITON", "LATICACIDVEN" in the last 168 hours.  Recent Results (from the past 240 hour(s))  Resp Panel by RT-PCR (Flu A&B, Covid) Anterior Nasal Swab      Status: None   Collection Time: 11/25/21  2:35 PM   Specimen: Anterior Nasal Swab  Result Value Ref Range Status   SARS Coronavirus 2 by RT PCR NEGATIVE NEGATIVE Final    Comment: (NOTE) SARS-CoV-2 target nucleic acids are NOT DETECTED.  The SARS-CoV-2 RNA is generally detectable in upper respiratory specimens during the acute phase of infection. The lowest concentration of SARS-CoV-2 viral copies this assay can detect is 138 copies/mL. A negative result does not preclude SARS-Cov-2 infection and should not be used as the sole basis for treatment or other patient management decisions. A negative result may occur with  improper specimen collection/handling, submission of specimen other than nasopharyngeal swab, presence of viral mutation(s) within the areas targeted by this assay, and inadequate number of viral copies(<138 copies/mL). A negative result must be combined with clinical observations, patient history, and epidemiological information. The expected result is Negative.  Fact Sheet for Patients:  EntrepreneurPulse.com.au  Fact Sheet for Healthcare Providers:  IncredibleEmployment.be  This test is no t yet approved or cleared by the [place] FDA and  has been authorized for detection and/or diagnosis of SARS-CoV-2 by FDA under an Emergency Use Authorization (EUA). This EUA will remain  in effect (meaning this test can be used) for the duration of the COVID-19 declaration under Section 564(b)(1) of the Act, 21 U.S.C.section 360bbb-3(b)(1), unless the authorization is terminated  or revoked sooner.       Influenza A by PCR NEGATIVE NEGATIVE Final   Influenza B by PCR NEGATIVE NEGATIVE Final    Comment: (NOTE) The Xpert Xpress SARS-CoV-2/FLU/RSV plus assay is intended as an aid in the diagnosis of influenza from Nasopharyngeal swab specimens and should not be used as a sole basis for treatment. Nasal washings and aspirates are  unacceptable for Xpert Xpress SARS-CoV-2/FLU/RSV testing.  Fact Sheet for Patients: EntrepreneurPulse.com.au  Fact Sheet for Healthcare Providers: IncredibleEmployment.be  This test is not yet approved or cleared by the [place] FDA and has been authorized for detection and/or diagnosis of SARS-CoV-2 by FDA under an Emergency Use Authorization (EUA). This EUA will remain in effect (meaning this test can be used) for the duration of the COVID-19 declaration under Section 564(b)(1) of the Act, 21 U.S.C. section 360bbb-3(b)(1), unless the authorization is terminated or revoked.  Performed at [facility], [address] [place], [place] [address]          Radiology Studies: No results found.      Scheduled Meds:  amLODipine  10 mg Oral Daily   aspirin  81 mg Oral Daily   atorvastatin  40 mg Oral QPM   clopidogrel  75 mg Oral Daily   heparin  5,000 Units Subcutaneous Q8H   hydrALAZINE  10 mg Oral Q8H   hydrocerin  Topical BID   polyethylene glycol  17 g Oral BID   senna-docusate  1 tablet Oral QHS   Continuous Infusions:    Assessment & Plan:   Principal Problem:   Acute CVA (cerebrovascular accident) (HCC) Active Problems:   Essential hypertension   CAD (coronary artery disease)   Acute infarct right lateral thalamus -persistent left sided weakness  -MRI head  + for right thalamic cva -admit tia/cva r/o protocol -Permissive HTN x48 hours from sx onset  -goal BP < 220/110, PRN above these parameters -  ASA 81mg  daily + plavix 75mg  daily x21 days f/b ASA 81mg  daily monotherapy after that -high intensity statin -neuro checks  9/28 EEG nml.  Stroke education Amb referral to neurology upon discharge. Stat head Ct for any changes in neuro exam LDL 98, goal <70 No new neurologic findings 10/2 continue to monitor  CIR bed pending       HTN -permissive HTN x 48 hours ,hold oral medications for now   9/30 we will start amlodipine 10mg  home dose Iv hydralazine prn 10/2 improving continue to monitor    CAD  -no active issues 10/2 asymptomatic   Anemia At baseline     Recent Oral surgery -full mouth teeth extraction  -complete post procedure out patient Abx              DVT prophylaxis: Heparin Code Status: Full Family Communication: Daughter Disposition Plan: inpt acute rehab Status is: Inpatient Remains inpatient appropriate because: unsafe dc, CIR bed pending         LOS: 5 days   Time spent: 10/30    , MD [facility] Pager [phone]  If 7PM-7AM, please contact night-coverage 11/30/2021, 2:23 PM

## 2021-11-30 NOTE — Progress Notes (Signed)
Inpatient Rehabilitation Admissions Coordinator   I await insurance approval and bed availability to admit to [facility] CIR/inpt rehab on [facility] in [place].Josem Kaufmann was begun Friday and [employer] typically takes 2 to 3 days for determination.  Danne Baxter, RN, MSN Rehab Admissions Coordinator [phone] 11/30/2021 8:28 AM

## 2021-11-30 NOTE — Care Management Important Message (Signed)
Important Message  Patient Details  Name: Kevin [name] MRN: [id number] Date of Birth: [birth year]   Medicare Important Message Given:  Yes     Dannette Barbara 11/30/2021, 12:52 PM

## 2021-11-30 NOTE — Progress Notes (Signed)
Occupational Therapy Treatment Patient Details Name: Kevin [name] MRN: [id number] DOB: Jun 02, 1933 Today's Date: 11/30/2021   History of present illness Pt is an 88 year old male admitted with acute R thalamic ischemic infarct; PMH significant for HTN, CAD, anemia   OT comments  Pt seen for OT tx this date. Pt eager to participate, family present and supportive. Pt required CGA and VC for sequencing during sup>sit EOB. MIN A + RW and VC for hand placement to stand. Pt able to ambulate a few feet with RW and CGA-MIN A to stand at sink for grooming tasks. Pt completed with setup and VC for UE support on the countertop to improve balance. No overt LOB but PRN VC needed to improve standing posture, bed close behind pt in case of falls. Pt will benefit from continued skilled OT services. Continue to recommend AIR.    Recommendations for follow up therapy are one component of a multi-disciplinary discharge planning process, led by the attending physician.  Recommendations may be updated based on patient status, additional functional criteria and insurance authorization.    Follow Up Recommendations  Acute inpatient rehab (3hours/day)    Assistance Recommended at Discharge Frequent or constant Supervision/Assistance  Patient can return home with the following  A lot of help with walking and/or transfers;A lot of help with bathing/dressing/bathroom   Equipment Recommendations  BSC/3in1;Tub/shower seat    Recommendations for Other Services      Precautions / Restrictions Precautions Precautions: Fall Restrictions Weight Bearing Restrictions: No       Mobility Bed Mobility Overal bed mobility: Needs Assistance Bed Mobility: Supine to Sit     Supine to sit: Min guard     General bed mobility comments: +time/effort, VC for sequencing    Transfers Overall transfer level: Needs assistance Equipment used: Rolling walker (2 wheels) Transfers: Sit to/from Stand Sit to Stand: Min  assist           General transfer comment: VC for hand placement     Balance Overall balance assessment: Needs assistance Sitting-balance support: Feet supported Sitting balance-Leahy Scale: Fair     Standing balance support: Single extremity supported, During functional activity, Reliant on assistive device for balance Standing balance-Leahy Scale: Poor                             ADL either performed or assessed with clinical judgement   ADL Overall ADL's : Needs assistance/impaired     Grooming: Standing;Min guard;Set up;Wash/dry face;Wash/dry Programmer, applications Details (indicate cue type and reason): VC for keeping UE support on counter for improved stability/safety, CGA-MIN A for standing balance but only set up and CGA for grooming task                             Functional mobility during ADLs: Minimal assistance;Rolling walker (2 wheels)      Extremity/Trunk Assessment              Vision       Perception     Praxis      Cognition Arousal/Alertness: Awake/alert Behavior During Therapy: WFL for tasks assessed/performed Overall Cognitive Status: Within Functional Limits for tasks assessed  Exercises Other Exercises Other Exercises: Pt instructed in [facility] activities to complete    Shoulder Instructions       General Comments      Pertinent Vitals/ Pain       Pain Assessment Pain Assessment: No/denies pain  Home Living                                          Prior Functioning/Environment              Frequency  Min 4X/week        Progress Toward Goals  OT Goals(current goals can now be found in the care plan section)  Progress towards OT goals: Progressing toward goals  Acute Rehab OT Goals Patient Stated Goal: to get back to PLOF OT Goal Formulation: With patient/family Time For Goal Achievement: 12/10/21 Potential to Achieve  Goals: Good  Plan Discharge plan remains appropriate;Frequency remains appropriate    Co-evaluation                 AM-PAC OT "6 Clicks" Daily Activity     Outcome Measure   Help from another person eating meals?: A Little Help from another person taking care of personal grooming?: A Little Help from another person toileting, which includes using toliet, bedpan, or urinal?: A Lot Help from another person bathing (including washing, rinsing, drying)?: A Lot Help from another person to put on and taking off regular upper body clothing?: A Little Help from another person to put on and taking off regular lower body clothing?: A Lot 6 Click Score: 15    End of Session Equipment Utilized During Treatment: Gait belt;Rolling walker (2 wheels)  OT Visit Diagnosis: History of falling (Z91.81);Unsteadiness on feet (R26.81);Other symptoms and signs involving the nervous system (R29.898);Hemiplegia and hemiparesis Hemiplegia - Right/Left: Left Hemiplegia - dominant/non-dominant: Non-Dominant Hemiplegia - caused by: Cerebral infarction   Activity Tolerance Patient tolerated treatment well   Patient Left with call bell/phone within reach;in bed;with bed alarm set;with family/visitor present   Nurse Communication          Time: 9518-8416 OT Time Calculation (min): 21 min  Charges: OT General Charges $OT Visit: 1 Visit OT Treatments $Self Care/Home Management : 8-22 mins  Ardeth Perfect., MPH, MS, OTR/L ascom [phone] 11/30/21, 1:24 PM

## 2021-12-01 DIAGNOSIS — I639 Cerebral infarction, unspecified: Secondary | ICD-10-CM | POA: Diagnosis not present

## 2021-12-01 MED ORDER — HYDRALAZINE HCL 25 MG PO TABS
25.0000 mg | ORAL_TABLET | Freq: Three times a day (TID) | ORAL | Status: DC
  Administered 2021-12-01 – 2021-12-02 (×2): 25 mg via ORAL
  Filled 2021-12-01 (×2): qty 1

## 2021-12-01 MED ORDER — PANTOPRAZOLE SODIUM 20 MG PO TBEC
20.0000 mg | DELAYED_RELEASE_TABLET | Freq: Every day | ORAL | Status: DC
  Administered 2021-12-01 – 2021-12-02 (×2): 20 mg via ORAL
  Filled 2021-12-01 (×3): qty 1

## 2021-12-01 NOTE — Progress Notes (Signed)
Inpatient Rehabilitation Admissions Coordinator   I will have a CIR bed at [facility] in [place] for his admit on Wednesday. I have contacted his daughter, Annie Main, acute team , Dr Kurtis Bushman and TOC. I will follow up tomorrow to make the arrangements.  Danne Baxter, RN, MSN Rehab Admissions Coordinator [phone] 12/01/2021 10:19 AM

## 2021-12-01 NOTE — Progress Notes (Signed)
Occupational Therapy Treatment Patient Details Name: Kevin [name] MRN: [id number] DOB: Apr 19, 1933 Today's Date: 12/01/2021   History of present illness Pt is an 88 year old male admitted with acute R thalamic ischemic infarct; PMH significant for HTN, CAD, anemia   OT comments  Pt seen for OT tx this date. Pt up in recliner after PT session, agreeable to OT. Pt endorses R hand feeling more "pins and needles" but reports L hand feeling stronger today. Pt instructed in [facility] activities, strengthening ex, and gentle stretches for R hand>L hand. Pt able to return demo. Pt continues to benefit from skilled OT services. Continue to recommend CIR.    Recommendations for follow up therapy are one component of a multi-disciplinary discharge planning process, led by the attending physician.  Recommendations may be updated based on patient status, additional functional criteria and insurance authorization.    Follow Up Recommendations  Acute inpatient rehab (3hours/day)    Assistance Recommended at Discharge Frequent or constant Supervision/Assistance  Patient can return home with the following  A lot of help with walking and/or transfers;A lot of help with bathing/dressing/bathroom   Equipment Recommendations  BSC/3in1;Tub/shower seat    Recommendations for Other Services Rehab consult    Precautions / Restrictions Precautions Precautions: Fall Restrictions Weight Bearing Restrictions: No       Mobility Bed Mobility                    Transfers                         Balance                                           ADL either performed or assessed with clinical judgement   ADL Overall ADL's : Needs assistance/impaired   Eating/Feeding Details (indicate cue type and reason): MIN A for setup with difficulty with small containers/packages/packets 2/2 FMC, strength, and sensory deficits                                         Extremity/Trunk Assessment              Vision       Perception     Praxis      Cognition Arousal/Alertness: Awake/alert Behavior During Therapy: WFL for tasks assessed/performed Overall Cognitive Status: Within Functional Limits for tasks assessed                                          Exercises Other Exercises Other Exercises: Pt instructed in [facility] activities, strengthening ex, and gentle stretches. Pt able to return demo. Notes R hand feeling like "pins and needles" but L hand feeling better    Shoulder Instructions       General Comments      Pertinent Vitals/ Pain       Pain Assessment Pain Assessment: No/denies pain  Home Living  Prior Functioning/Environment              Frequency  Min 4X/week        Progress Toward Goals  OT Goals(current goals can now be found in the care plan section)  Progress towards OT goals: Progressing toward goals  Acute Rehab OT Goals Patient Stated Goal: to get back to PLOF OT Goal Formulation: With patient/family Time For Goal Achievement: 12/10/21 Potential to Achieve Goals: Good  Plan Discharge plan remains appropriate;Frequency remains appropriate    Co-evaluation                 AM-PAC OT "6 Clicks" Daily Activity     Outcome Measure   Help from another person eating meals?: A Little Help from another person taking care of personal grooming?: A Little Help from another person toileting, which includes using toliet, bedpan, or urinal?: A Lot Help from another person bathing (including washing, rinsing, drying)?: A Lot Help from another person to put on and taking off regular upper body clothing?: A Little Help from another person to put on and taking off regular lower body clothing?: A Lot 6 Click Score: 15    End of Session    OT Visit Diagnosis: History of falling (Z91.81);Unsteadiness on feet (R26.81);Other  symptoms and signs involving the nervous system (R29.898);Hemiplegia and hemiparesis Hemiplegia - Right/Left: Left Hemiplegia - dominant/non-dominant: Non-Dominant Hemiplegia - caused by: Cerebral infarction   Activity Tolerance Patient tolerated treatment well   Patient Left in chair;with call bell/phone within reach;with chair alarm set   Nurse Communication          Time: 0272-5366 OT Time Calculation (min): 16 min  Charges: OT General Charges $OT Visit: 1 Visit OT Treatments $Therapeutic Activity: 8-22 mins  Ardeth Perfect., MPH, MS, OTR/L ascom [phone] 12/01/21, 1:14 PM

## 2021-12-01 NOTE — Progress Notes (Signed)
Kevin [name] PROGRESS NOTE    Kevin [name]  GQQ:[id number] DOB: 07/26/1933 DOA: 11/25/2021 PCP: Mick Sell, MD    Brief Narrative:  Kevin [name] is a 88 y.o. male with medical history significant of  HTN, CAD non obstructive, Anemia  who presents to ED with complaint of left sided weakness . Per patient this am on  waking around 1 am he felt he was not able to move his arm or left leg. He states he attempted to ambulate and fell.  He notes no associated HA, dizzy , chest pain , slurred speech, confusion, difficulty swallowing or prior symptoms like this in the past. Found with  Acute infarct right lateral thalamus on MRI . Neurology was consulted. Patient stable, awaiting CIR bed.     Consultants:  neurology  Procedures:   Antimicrobials:      Subjective: No complaints, no shortness of breath or chest pain.  No dizziness or new neurologic sx   Objective: Vitals:   11/30/21 2149 12/01/21 0133 12/01/21 0313 12/01/21 0841  BP: (!) 142/68 132/63 133/70 (!) 169/88  Pulse: 69 66 70 74  Resp: 19 19 19 14   Temp: (!) 97.5 F (36.4 C) 98.5 F (36.9 C) 99.1 F (37.3 C) 98.4 F (36.9 C)  TempSrc: Oral Oral Oral   SpO2: 100% 100% 100% 100%  Weight:      Height:        Intake/Output Summary (Last 24 hours) at 12/01/2021 0846 Last data filed at 12/01/2021 0500 Gross per 24 hour  Intake 480 ml  Output 700 ml  Net -220 ml   Filed Weights   11/25/21 1219 11/26/21 1753 11/29/21 0332  Weight: 86.2 kg 73.7 kg 73.5 kg    Examination: Calm, NAD Cta no w/r Reg s1/s2 no gallop Soft benign +bs No edema Aaoxox3  Mood and affect appropriate in current setting     Data Reviewed: I have personally reviewed following labs and imaging studies  CBC: Recent Labs  Lab 11/25/21 1435  WBC 4.6  NEUTROABS 3.0  HGB 12.1*  HCT 37.7*  MCV 85.1  PLT 265   Basic Metabolic Panel: Recent Labs  Lab 11/25/21 1435  NA 139  K 3.6  CL 107  CO2 22  GLUCOSE 97  BUN 18  CREATININE  0.92  CALCIUM 8.6*   GFR: Estimated Creatinine Clearance: 59.9 mL/min (by C-G formula based on SCr of 0.92 mg/dL). Liver Function Tests: Recent Labs  Lab 11/25/21 1435  AST 20  ALT 18  ALKPHOS 60  BILITOT 0.5  PROT 7.2  ALBUMIN 3.4*   No results for input(s): "LIPASE", "AMYLASE" in the last 168 hours. No results for input(s): "AMMONIA" in the last 168 hours. Coagulation Profile: Recent Labs  Lab 11/25/21 1435  INR 1.1   Cardiac Enzymes: No results for input(s): "CKTOTAL", "CKMB", "CKMBINDEX", "TROPONINI" in the last 168 hours. BNP (last 3 results) No results for input(s): "PROBNP" in the last 8760 hours. HbA1C: No results for input(s): "HGBA1C" in the last 72 hours.  CBG: Recent Labs  Lab 11/25/21 1210  GLUCAP 99   Lipid Profile: No results for input(s): "CHOL", "HDL", "LDLCALC", "TRIG", "CHOLHDL", "LDLDIRECT" in the last 72 hours.  Thyroid Function Tests: No results for input(s): "TSH", "T4TOTAL", "FREET4", "T3FREE", "THYROIDAB" in the last 72 hours. Anemia Panel: No results for input(s): "VITAMINB12", "FOLATE", "FERRITIN", "TIBC", "IRON", "RETICCTPCT" in the last 72 hours.  Sepsis Labs: No results for input(s): "PROCALCITON", "LATICACIDVEN" in the last 168 hours.  Recent  Results (from the past 240 hour(s))  Resp Panel by RT-PCR (Flu A&B, Covid) Anterior Nasal Swab     Status: None   Collection Time: 11/25/21  2:35 PM   Specimen: Anterior Nasal Swab  Result Value Ref Range Status   SARS Coronavirus 2 by RT PCR NEGATIVE NEGATIVE Final    Comment: (NOTE) SARS-CoV-2 target nucleic acids are NOT DETECTED.  The SARS-CoV-2 RNA is generally detectable in upper respiratory specimens during the acute phase of infection. The lowest concentration of SARS-CoV-2 viral copies this assay can detect is 138 copies/mL. A negative result does not preclude SARS-Cov-2 infection and should not be used as the sole basis for treatment or other patient management decisions. A  negative result may occur with  improper specimen collection/handling, submission of specimen other than nasopharyngeal swab, presence of viral mutation(s) within the areas targeted by this assay, and inadequate number of viral copies(<138 copies/mL). A negative result must be combined with clinical observations, patient history, and epidemiological information. The expected result is Negative.  Fact Sheet for Patients:  EntrepreneurPulse.com.au  Fact Sheet for Healthcare Providers:  IncredibleEmployment.be  This test is no t yet approved or cleared by the [place] FDA and  has been authorized for detection and/or diagnosis of SARS-CoV-2 by FDA under an Emergency Use Authorization (EUA). This EUA will remain  in effect (meaning this test can be used) for the duration of the COVID-19 declaration under Section 564(b)(1) of the Act, 21 U.S.C.section 360bbb-3(b)(1), unless the authorization is terminated  or revoked sooner.       Influenza A by PCR NEGATIVE NEGATIVE Final   Influenza B by PCR NEGATIVE NEGATIVE Final    Comment: (NOTE) The Xpert Xpress SARS-CoV-2/FLU/RSV plus assay is intended as an aid in the diagnosis of influenza from Nasopharyngeal swab specimens and should not be used as a sole basis for treatment. Nasal washings and aspirates are unacceptable for Xpert Xpress SARS-CoV-2/FLU/RSV testing.  Fact Sheet for Patients: EntrepreneurPulse.com.au  Fact Sheet for Healthcare Providers: IncredibleEmployment.be  This test is not yet approved or cleared by the [place] FDA and has been authorized for detection and/or diagnosis of SARS-CoV-2 by FDA under an Emergency Use Authorization (EUA). This EUA will remain in effect (meaning this test can be used) for the duration of the COVID-19 declaration under Section 564(b)(1) of the Act, 21 U.S.C. section 360bbb-3(b)(1), unless the authorization  is terminated or revoked.  Performed at [facility], [address], [place], [place] [address]          Radiology Studies: No results found.      Scheduled Meds:  amLODipine  10 mg Oral Daily   aspirin  81 mg Oral Daily   atorvastatin  40 mg Oral QPM   clopidogrel  75 mg Oral Daily   heparin  5,000 Units Subcutaneous Q8H   hydrALAZINE  10 mg Oral Q8H   hydrocerin   Topical BID   polyethylene glycol  17 g Oral BID   senna-docusate  1 tablet Oral QHS   Continuous Infusions:    Assessment & Plan:   Principal Problem:   Acute CVA (cerebrovascular accident) [facility]) Active Problems:   Essential hypertension   CAD (coronary artery disease)   Acute infarct right lateral thalamus -MRI head  + for right thalamic cva Neurology consulted BP control   ASA 81mg  daily + plavix 75mg  daily x21 days f/b ASA 81mg  daily monotherapy after that -high intensity statin -neuro checks  9/28 EEG nml.  Stroke  education Amb referral to neurology upon discharge. Stat head Ct for any changes in neuro exam LDL 98, goal <70 No new neurologic findings 10/3 medically stable awaiting CIR bed availability Add ppi for gi ppx      HTN Initially had permissive hypertension now needed BP control Will increase hydralazine to 25 mg 3 times daily   CAD  -no active issues 10/3 asymptomatic    anemia At baseline     Recent Oral surgery -full mouth teeth extraction  -complete post procedure out patient Abx              DVT prophylaxis: Heparin Code Status: Full Family Communication: Daughter at bedside Disposition Plan: inpt acute rehab Status is: Inpatient Remains inpatient appropriate because: unsafe dc, CIR bed pending         LOS: 6 days   Time spent:    Lynn Ito, MD [facility] Pager [phone]  If 7PM-7AM, please contact night-coverage 12/01/2021, 8:46 AM

## 2021-12-01 NOTE — Progress Notes (Signed)
Physical Therapy Treatment Patient Details Name: Kevin [name] MRN: [id number] DOB: 1933/07/07 Today's Date: 12/01/2021   History of Present Illness Pt is an 88 year old male admitted with acute R thalamic ischemic infarct; PMH significant for HTN, CAD, anemia    PT Comments    Pt in bed, ready for session.  To EOB with increased time but no assist. Sitting ex EOB while awaiting +2 with emphasis on upright posture and core engagement as he does lean post with activities.  Sit to stand with min/mod a x 1 and cues depending on surface height bed vs recliner.  He is able to walk 30' then 100' with RW and min a x 1 with mod cues for walker placement and cues to fully advance LLE.  Gait quality decreased in room with turns and tighter spaces and also as he fatigues.  Overall awareness is decreased and he needs cues for safety and gait sequencing.    CIR remains appropriate as he is motivated, making gains and has good family support.   Recommendations for follow up therapy are one component of a multi-disciplinary discharge planning process, led by the attending physician.  Recommendations may be updated based on patient status, additional functional criteria and insurance authorization.  Follow Up Recommendations  Acute inpatient rehab (3hours/day)     Assistance Recommended at Discharge Intermittent Supervision/Assistance  Patient can return home with the following Assistance with cooking/housework;Help with stairs or ramp for entrance;Assist for transportation;A little help with walking and/or transfers;A little help with bathing/dressing/bathroom   Equipment Recommendations   (defer to next level of care.)    Recommendations for Other Services       Precautions / Restrictions Precautions Precautions: Fall Restrictions Weight Bearing Restrictions: No     Mobility  Bed Mobility Overal bed mobility: Needs Assistance Bed Mobility: Supine to Sit     Supine to sit: Min guard      General bed mobility comments: +time/effort, VC for sequencing    Transfers Overall transfer level: Needs assistance Equipment used: Rolling walker (2 wheels) Transfers: Sit to/from Stand Sit to Stand: Min assist, Mod assist           General transfer comment: VC for hand placement.  increased assist for lower surfaces    Ambulation/Gait Ambulation/Gait assistance: Min assist Gait Distance (Feet): 100 Feet Assistive device: Rolling walker (2 wheels) Gait Pattern/deviations: Step-through pattern, Trunk flexed, Narrow base of support, Step-to pattern, Decreased step length - left Gait velocity: decreased     General Gait Details: 50; x 1, 100' x 1 with RW and mod verbal cues.  Continues with antalgic gait with varied step lengths often not bringing L foot through with cues to correct.  seems unaware without cues increasing risk for falls.   Stairs             Wheelchair Mobility    Modified Rankin (Stroke Patients Only)       Balance Overall balance assessment: Needs assistance Sitting-balance support: Feet supported Sitting balance-Leahy Scale: Fair     Standing balance support: Single extremity supported, During functional activity, Reliant on assistive device for balance Standing balance-Leahy Scale: Poor Standing balance comment: Pt is high fall risk                            Cognition Arousal/Alertness: Awake/alert Behavior During Therapy: WFL for tasks assessed/performed Overall Cognitive Status: Within Functional Limits for tasks assessed  General Comments: Pt lack full insight of deficits and has overall poor safety awareness. He is however able to follow commands consistently with increased processing time. Is oriented.  making jokes during session        Exercises Other Exercises Other Exercises: seated AROM EOB for trunk control while awaiting +2 assist    General Comments         Pertinent Vitals/Pain Pain Assessment Pain Assessment: No/denies pain    Home Living                          Prior Function            PT Goals (current goals can now be found in the care plan section) Progress towards PT goals: Progressing toward goals    Frequency    7X/week      PT Plan Current plan remains appropriate    Co-evaluation              AM-PAC PT "6 Clicks" Mobility   Outcome Measure  Help needed turning from your back to your side while in a flat bed without using bedrails?: None Help needed moving from lying on your back to sitting on the side of a flat bed without using bedrails?: A Little Help needed moving to and from a bed to a chair (including a wheelchair)?: A Little Help needed standing up from a chair using your arms (e.g., wheelchair or bedside chair)?: A Little Help needed to walk in hospital room?: A Lot Help needed climbing 3-5 steps with a railing? : A Lot 6 Click Score: 17    End of Session Equipment Utilized During Treatment: Gait belt Activity Tolerance: Patient tolerated treatment well Patient left: in chair;with call bell/phone within reach;with family/visitor present Nurse Communication: Mobility status PT Visit Diagnosis: Unsteadiness on feet (R26.81);Muscle weakness (generalized) (M62.81);Difficulty in walking, not elsewhere classified (R26.2);Hemiplegia and hemiparesis Hemiplegia - Right/Left: Left Hemiplegia - dominant/non-dominant: Non-dominant Hemiplegia - caused by: Cerebral infarction     Time: 1100-1120 PT Time Calculation (min) (ACUTE ONLY): 20 min  Charges:  $Gait Training: 8-22 mins                   Chesley Noon, PTA 12/01/21, 11:38 AM

## 2021-12-02 ENCOUNTER — Other Ambulatory Visit: Payer: Self-pay

## 2021-12-02 ENCOUNTER — Inpatient Hospital Stay (HOSPITAL_COMMUNITY)
Admission: RE | Admit: 2021-12-02 | Discharge: 2021-12-14 | DRG: 057 | Disposition: A | Discharge status: Home Health | Payer: Medicare HMO | Source: Other Acute Inpatient Hospital | Attending: Physical Medicine & Rehabilitation | Admitting: Physical Medicine & Rehabilitation

## 2021-12-02 ENCOUNTER — Encounter (HOSPITAL_COMMUNITY): Payer: Self-pay | Admitting: Physical Medicine & Rehabilitation

## 2021-12-02 DIAGNOSIS — I10 Essential (primary) hypertension: Secondary | ICD-10-CM | POA: Diagnosis present

## 2021-12-02 DIAGNOSIS — D649 Anemia, unspecified: Secondary | ICD-10-CM | POA: Diagnosis not present

## 2021-12-02 DIAGNOSIS — K5900 Constipation, unspecified: Secondary | ICD-10-CM | POA: Diagnosis not present

## 2021-12-02 DIAGNOSIS — I69354 Hemiplegia and hemiparesis following cerebral infarction affecting left non-dominant side: Secondary | ICD-10-CM | POA: Diagnosis not present

## 2021-12-02 DIAGNOSIS — I6381 Other cerebral infarction due to occlusion or stenosis of small artery: Secondary | ICD-10-CM

## 2021-12-02 DIAGNOSIS — E785 Hyperlipidemia, unspecified: Secondary | ICD-10-CM | POA: Diagnosis not present

## 2021-12-02 DIAGNOSIS — K089 Disorder of teeth and supporting structures, unspecified: Secondary | ICD-10-CM | POA: Diagnosis not present

## 2021-12-02 DIAGNOSIS — Z79899 Other long term (current) drug therapy: Secondary | ICD-10-CM | POA: Diagnosis not present

## 2021-12-02 DIAGNOSIS — R7989 Other specified abnormal findings of blood chemistry: Secondary | ICD-10-CM | POA: Diagnosis present

## 2021-12-02 DIAGNOSIS — R531 Weakness: Secondary | ICD-10-CM | POA: Diagnosis present

## 2021-12-02 DIAGNOSIS — I639 Cerebral infarction, unspecified: Secondary | ICD-10-CM | POA: Diagnosis not present

## 2021-12-02 DIAGNOSIS — I2510 Atherosclerotic heart disease of native coronary artery without angina pectoris: Secondary | ICD-10-CM | POA: Diagnosis present

## 2021-12-02 LAB — CBC
HCT: 37.9 % — ABNORMAL LOW (ref 39.0–52.0)
Hemoglobin: 12.2 g/dL — ABNORMAL LOW (ref 13.0–17.0)
MCH: 27.4 pg (ref 26.0–34.0)
MCHC: 32.2 g/dL (ref 30.0–36.0)
MCV: 85 fL (ref 80.0–100.0)
Platelets: 329 10*3/uL (ref 150–400)
RBC: 4.46 MIL/uL (ref 4.22–5.81)
RDW: 14.3 % (ref 11.5–15.5)
WBC: 4.9 10*3/uL (ref 4.0–10.5)
nRBC: 0 % (ref 0.0–0.2)

## 2021-12-02 LAB — CREATININE, SERUM
Creatinine, Ser: 0.91 mg/dL (ref 0.61–1.24)
GFR, Estimated: >60 mL/min (ref >60)

## 2021-12-02 MED ORDER — HYDRALAZINE HCL 25 MG PO TABS: 25.0000 mg | ORAL_TABLET | Freq: Three times a day (TID) | ORAL | Status: DC

## 2021-12-02 MED ORDER — HEPARIN SODIUM (PORCINE) 5000 UNIT/ML IJ SOLN: 5000.0000 [IU] | Freq: Three times a day (TID) | INTRAMUSCULAR | Status: DC

## 2021-12-02 MED ORDER — POLYETHYLENE GLYCOL 3350 17 G PO PACK
17.0000 g | PACK | Freq: Two times a day (BID) | ORAL | Status: DC
  Administered 2021-12-02 – 2021-12-09 (×14): 17 g via ORAL
  Filled 2021-12-02 (×15): qty 1

## 2021-12-02 MED ORDER — ATORVASTATIN CALCIUM 40 MG PO TABS: 40.0000 mg | ORAL_TABLET | Freq: Every evening | ORAL | Status: DC

## 2021-12-02 MED ORDER — ACETAMINOPHEN 325 MG PO TABS: 650.0000 mg | ORAL_TABLET | ORAL | Status: DC | PRN

## 2021-12-02 MED ORDER — SENNOSIDES-DOCUSATE SODIUM 8.6-50 MG PO TABS: 1.0000 | ORAL_TABLET | Freq: Every day | ORAL | Status: DC

## 2021-12-02 MED ORDER — HYDROCERIN EX CREA: 1.0000 | TOPICAL_CREAM | Freq: Two times a day (BID) | CUTANEOUS | 0 refills | Status: DC | PRN

## 2021-12-02 MED ORDER — ACETAMINOPHEN 650 MG RE SUPP: 650.0000 mg | RECTAL | Status: DC | PRN

## 2021-12-02 MED ORDER — AMLODIPINE BESYLATE 10 MG PO TABS
10.0000 mg | ORAL_TABLET | Freq: Every day | ORAL | Status: DC
  Administered 2021-12-03 – 2021-12-14 (×12): 10 mg via ORAL
  Filled 2021-12-02 (×13): qty 1

## 2021-12-02 MED ORDER — CLOPIDOGREL BISULFATE 75 MG PO TABS: 75.0000 mg | ORAL_TABLET | Freq: Every day | ORAL | Status: DC

## 2021-12-02 MED ORDER — CLOPIDOGREL BISULFATE 75 MG PO TABS
75.0000 mg | ORAL_TABLET | Freq: Every day | ORAL | Status: DC
  Administered 2021-12-03 – 2021-12-14 (×12): 75 mg via ORAL
  Filled 2021-12-02 (×12): qty 1

## 2021-12-02 MED ORDER — ATORVASTATIN CALCIUM 40 MG PO TABS
40.0000 mg | ORAL_TABLET | Freq: Every evening | ORAL | Status: DC
  Administered 2021-12-02 – 2021-12-13 (×12): 40 mg via ORAL
  Filled 2021-12-02 (×12): qty 1

## 2021-12-02 MED ORDER — HYDRALAZINE HCL 25 MG PO TABS
25.0000 mg | ORAL_TABLET | Freq: Three times a day (TID) | ORAL | Status: DC
  Administered 2021-12-02 – 2021-12-14 (×33): 25 mg via ORAL
  Filled 2021-12-02 (×35): qty 1

## 2021-12-02 MED ORDER — ACETAMINOPHEN 160 MG/5ML PO SOLN: 650.0000 mg | ORAL | Status: DC | PRN

## 2021-12-02 MED ORDER — HEPARIN SODIUM (PORCINE) 5000 UNIT/ML IJ SOLN
5000.0000 [IU] | Freq: Three times a day (TID) | INTRAMUSCULAR | Status: DC
  Administered 2021-12-02 – 2021-12-07 (×15): 5000 [IU] via SUBCUTANEOUS
  Filled 2021-12-02 (×15): qty 1

## 2021-12-02 MED ORDER — ASPIRIN 81 MG PO CHEW: 81.0000 mg | CHEWABLE_TABLET | Freq: Every day | ORAL | Status: AC

## 2021-12-02 MED ORDER — POLYETHYLENE GLYCOL 3350 17 G PO PACK: 17.0000 g | PACK | Freq: Two times a day (BID) | ORAL | 0 refills | Status: DC | PRN

## 2021-12-02 MED ORDER — HYDROCERIN EX CREA
TOPICAL_CREAM | Freq: Two times a day (BID) | CUTANEOUS | Status: DC
  Administered 2021-12-04: 1 via TOPICAL
  Filled 2021-12-02 (×2): qty 113

## 2021-12-02 MED ORDER — ASPIRIN 81 MG PO CHEW
81.0000 mg | CHEWABLE_TABLET | Freq: Every day | ORAL | Status: DC
  Administered 2021-12-03 – 2021-12-14 (×12): 81 mg via ORAL
  Filled 2021-12-02 (×12): qty 1

## 2021-12-02 MED ORDER — PANTOPRAZOLE SODIUM 20 MG PO TBEC
20.0000 mg | DELAYED_RELEASE_TABLET | Freq: Every day | ORAL | Status: DC
  Administered 2021-12-03 – 2021-12-14 (×12): 20 mg via ORAL
  Filled 2021-12-02 (×12): qty 1

## 2021-12-02 MED ORDER — ALUM & MAG HYDROXIDE-SIMETH 200-200-20 MG/5ML PO SUSP: 30.0000 mL | Freq: Four times a day (QID) | ORAL | 0 refills | Status: DC | PRN

## 2021-12-02 MED ORDER — SENNOSIDES-DOCUSATE SODIUM 8.6-50 MG PO TABS
1.0000 | ORAL_TABLET | Freq: Every day | ORAL | Status: DC
  Administered 2021-12-02 – 2021-12-09 (×8): 1 via ORAL
  Filled 2021-12-02 (×8): qty 1

## 2021-12-02 MED ORDER — PANTOPRAZOLE SODIUM 20 MG PO TBEC: 20.0000 mg | DELAYED_RELEASE_TABLET | Freq: Every day | ORAL | Status: DC

## 2021-12-02 NOTE — Progress Notes (Signed)
Occupational Therapy Treatment Patient Details Name: Kevin [name] MRN: [id number] DOB: January 30, 1934 Today's Date: 12/02/2021   History of present illness Pt is an 88 year old male admitted with acute R thalamic ischemic infarct; PMH significant for HTN, CAD, anemia   OT comments  Kevin [name] was seen for OT treatment on this date. Upon arrival to room pt reclined in chair, daughter at bed side, agreeable to tx. Pt requires SETUP face washing seated in chair. MIN A + RW sit<>stand at chair height and ~30 ft functional mobility, assist for RW mgmt and balance with turning, cues to clear L foot, required 1 standing rest break. Poor eccentric control return to sitting with cues for hand placement. Pt making good progress toward goals, will continue to follow POC. Discharge recommendation remains appropriate.     Recommendations for follow up therapy are one component of a multi-disciplinary discharge planning process, led by the attending physician.  Recommendations may be updated based on patient status, additional functional criteria and insurance authorization.    Follow Up Recommendations  Acute inpatient rehab (3hours/day)    Assistance Recommended at Discharge Frequent or constant Supervision/Assistance  Patient can return home with the following  A lot of help with walking and/or transfers;A lot of help with bathing/dressing/bathroom   Equipment Recommendations  BSC/3in1;Tub/shower seat    Recommendations for Other Services      Precautions / Restrictions Precautions Precautions: Fall Restrictions Weight Bearing Restrictions: No       Mobility Bed Mobility               General bed mobility comments: received and left in bed    Transfers Overall transfer level: Needs assistance Equipment used: Rolling walker (2 wheels) Transfers: Sit to/from Stand Sit to Stand: Min assist           General transfer comment: from low chair height     Balance Overall balance  assessment: Needs assistance Sitting-balance support: Feet supported Sitting balance-Leahy Scale: Good     Standing balance support: Bilateral upper extremity supported, Reliant on assistive device for balance Standing balance-Leahy Scale: Poor                             ADL either performed or assessed with clinical judgement   ADL Overall ADL's : Needs assistance/impaired                                       General ADL Comments: SETUP face washing seated in chair. MIN A + RW for simulated toilet t/f      Cognition Arousal/Alertness: Awake/alert Behavior During Therapy: WFL for tasks assessed/performed Overall Cognitive Status: Within Functional Limits for tasks assessed                                                     Pertinent Vitals/ Pain       Pain Assessment Pain Assessment: No/denies pain   Frequency  Min 4X/week        Progress Toward Goals  OT Goals(current goals can now be found in the care plan section)  Progress towards OT goals: Progressing toward goals  Acute Rehab OT Goals Patient Stated Goal: to go home  OT Goal Formulation: With patient/family Time For Goal Achievement: 12/10/21 Potential to Achieve Goals: Good ADL Goals Pt Will Perform Grooming: sitting;standing;with modified independence Pt Will Perform Lower Body Dressing: with modified independence;sit to/from stand Pt Will Transfer to Toilet: with modified independence Pt Will Perform Toileting - Clothing Manipulation and hygiene: with modified independence;sit to/from stand Pt/caregiver will Perform Home Exercise Program: Increased strength;Left upper extremity;With written HEP provided  Plan Discharge plan remains appropriate;Frequency remains appropriate    Co-evaluation        PT goals addressed during session: Mobility/safety with mobility;Balance;Proper use of DME;Strengthening/ROM        AM-PAC OT "6 Clicks" Daily  Activity     Outcome Measure   Help from another person eating meals?: A Little Help from another person taking care of personal grooming?: A Little Help from another person toileting, which includes using toliet, bedpan, or urinal?: A Lot Help from another person bathing (including washing, rinsing, drying)?: A Lot Help from another person to put on and taking off regular upper body clothing?: A Little Help from another person to put on and taking off regular lower body clothing?: A Lot 6 Click Score: 15    End of Session Equipment Utilized During Treatment: Gait belt;Rolling walker (2 wheels)  OT Visit Diagnosis: History of falling (Z91.81);Unsteadiness on feet (R26.81);Other symptoms and signs involving the nervous system (R29.898);Hemiplegia and hemiparesis Hemiplegia - Right/Left: Left Hemiplegia - dominant/non-dominant: Non-Dominant Hemiplegia - caused by: Cerebral infarction   Activity Tolerance Patient tolerated treatment well   Patient Left in chair;with call bell/phone within reach;with chair alarm set;with family/visitor present   Nurse Communication          Time: 5625-6389 OT Time Calculation (min): 8 min  Charges: OT General Charges $OT Visit: 1 Visit OT Treatments $Therapeutic Activity: 8-22 mins  Kathie Dike, M.S. OTR/L  12/02/21, 9:23 AM  ascom [phone]

## 2021-12-02 NOTE — Progress Notes (Signed)
Kevin [name], Yuri, MD  Physician Other PMR Pre-admission     Signed Date of Service:  11/27/2021 12:45 PM  Related encounter: ED to Hosp-Admission (Discharged) from 11/25/2021 in [facility]   Signed      PMR Admission Coordinator Pre-Admission Assessment   Patient: Kevin [name] is an 88 y.o., male MRN: [id number] DOB: 01/14/1934 Height: 6\' 2"  (188 cm) Weight: 73.5 kg                                                                                                                                                  Insurance Information HMO:     PPO:      PCP:      IPA:      80/20:      OTHER:  PRIMARY: [employer]      Policy#: [id number]      Subscriber: pt CM Name: Kevin MilletMegan      Phone#: [phone] ext [phone]     Fax#: [phone] Pre-Cert#: [id number]  approved for 7 days    Employer:  Benefits:  Phone #: [phone]     Name: 9/29 Eff. Date: 03/01/21     Deduct: none      Out of Pocket Max: $3400      Life Max: none  CIR: $295 co pay per day days 1 until 6      SNF: no co pay for days 1 until 20; $196 co pay per day days 21 until 100 Outpatient: $10 to $20 per visit     Co-Pay: visits per medical neccesity Home Health: 100%      Co-Pay: visits per medical neccesity DME: 80%     Co-Pay: 20% Providers: in network  SECONDARY: none   Financial Counselor:       Phone#:    The Data processing manager"Data Collection Information Summary" for patients in Inpatient Rehabilitation Facilities with attached "Privacy Act Statement-Health Care Records" was provided and verbally reviewed with: Family   Emergency Contact Information Contact Information       Name Relation Home Work [place]    Kevin [name] (POA),Kevin [name] Daughter     [phone]    Kevin [name],Kevin [name] Daughter     [phone]         Current Medical History  Patient Admitting Diagnosis: CVA   History of Present Illness:  88 year old right-handed male with history of hypertension, CAD/nonobstructive, chronic anemia as well as recent  oral surgery with full mouth teeth extraction.  Per chart review patient lives with adult aged grandchildren.  1 level home.  Independent with occasional cane.  Independent ADLs/drives and does lawn care.  [facility] with acute onset of left-sided weakness/transient confusion resulting a fall.  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  Patient did not receive tPA.  CT angiogram head and neck no emergent large vessel occlusion.  MRI cervical thoracic lumbar spine showed no acute changes.  No significant stenosis.  EEG negative for seizure.  Echocardiogram ejection fraction of 70 to 75% no wall motion abnormalities.  Admission chemistries unremarkable, hemoglobin 12.1, hemoglobin A1c 5.7, urine drug screen negative.  Neurology follow-up currently maintained on low-dose aspirin 81 mg daily and Plavix 75 mg daily for CVA prophylaxis x3 weeks then aspirin alone.  Subcutaneous heparin for DVT prophylaxis.  Tolerating a regular diet.     Glasgow Coma Scale Score: 15   Past Medical History      Past Medical History:  Diagnosis Date   Anemia     Colon polyps     Coronary artery disease     Hematuria     History of hiatal hernia     Hypertension      Has the patient had major surgery during 100 days prior to admission?  Yes   Family History  family history is not on file.   Current Medications    Current Facility-Administered Medications:    acetaminophen (TYLENOL) tablet 650 mg, 650 mg, Oral, Q4H PRN, 650 mg at 11/30/21 0942 **OR** acetaminophen (TYLENOL) 160 MG/5ML solution 650 mg, 650 mg, Per Tube, Q4H PRN **OR** acetaminophen (TYLENOL) suppository 650 mg, 650 mg, Rectal, Q4H PRN, Clance Boll, MD   alum & mag hydroxide-simeth (MAALOX/MYLANTA) 200-200-20 MG/5ML suspension 30 mL, 30 mL, Oral, Q6H PRN, Kurtis Bushman, Sahar, MD, 30 mL at 11/29/21 0141   amLODipine (NORVASC) tablet 10 mg, 10 mg, Oral, Daily, Amery, Sahar, MD, 10 mg at 12/02/21 1008   aspirin chewable tablet  81 mg, 81 mg, Oral, Daily, Derek Jack, MD, 81 mg at 12/02/21 1008   atorvastatin (LIPITOR) tablet 40 mg, 40 mg, Oral, QPM, Derek Jack, MD, 40 mg at 12/01/21 1850   clopidogrel (PLAVIX) tablet 75 mg, 75 mg, Oral, Daily, Kurtis Bushman, Sahar, MD, 75 mg at 12/02/21 1008   heparin injection 5,000 Units, 5,000 Units, Subcutaneous, Q8H, Myles Rosenthal A, MD, 5,000 Units at 12/02/21 0543   hydrALAZINE (APRESOLINE) injection 10 mg, 10 mg, Intravenous, Q6H PRN, Nolberto Hanlon, MD   hydrALAZINE (APRESOLINE) tablet 25 mg, 25 mg, Oral, Q8H, Amery, Sahar, MD, 25 mg at 12/02/21 0543   hydrocerin (EUCERIN) cream, , Topical, BID, Raulkar, Clide Deutscher, MD, Given at 12/02/21 1010   pantoprazole (PROTONIX) EC tablet 20 mg, 20 mg, Oral, Daily, Amery, Sahar, MD, 20 mg at 12/02/21 1009   polyethylene glycol (MIRALAX / GLYCOLAX) packet 17 g, 17 g, Oral, BID, Kurtis Bushman, Sahar, MD, 17 g at 12/02/21 1009   senna-docusate (Senokot-S) tablet 1 tablet, 1 tablet, Oral, QHS, Nolberto Hanlon, MD, 1 tablet at 12/01/21 2123   Patients Current Diet:  Diet Order                  Diet Heart Room service appropriate? Yes; Fluid consistency: Thin; Fluid restriction: Other (see comments)  Diet effective now                       Precautions / Restrictions Precautions Precautions: Fall Restrictions Weight Bearing Restrictions: No    Has the patient had 2 or more falls or a fall with injury in the past year?No   Prior Activity Level Community (5-7x/wk): MOd I with cane as needed, Drives, mows his grass with push mower   Prior Functional Level Prior Function Prior Level of Function : Independent/Modified Independent Mobility Comments: prn cane use community distances; one fall morning of admission  ADLs Comments: MOD I-I in ADL/IADL, cooks, cleans, drives, grocery shops, mows his 2 acre lawn   Self Care: Did the patient need help bathing, dressing, using the toilet or eating?  Independent   Indoor Mobility: Did the  patient need assistance with walking from room to room (with or without device)? Independent   Stairs: Did the patient need assistance with internal or external stairs (with or without device)? Independent   Functional Cognition: Did the patient need help planning regular tasks such as shopping or remembering to take medications? Independent   Patient Information Are you of Hispanic, Latino/a,or Spanish origin?: A. No, not of Hispanic, Latino/a, or Spanish origin What is your race?: B. Black or African American Do you need or want an interpreter to communicate with a doctor or health care staff?: 0. No   Patient's Response To:  Health Literacy and Transportation Is the patient able to respond to health literacy and transportation needs?: Yes Health Literacy - How often do you need to have someone help you when you read instructions, pamphlets, or other written material from your doctor or pharmacy?: Never In the past 12 months, has lack of transportation kept you from medical appointments or from getting medications?: No In the past 12 months, has lack of transportation kept you from meetings, work, or from getting things needed for daily living?: No   Home Assistive Devices / Equipment Home Equipment: Gilmer Mor - single point   Prior Device Use: Indicate devices/aids used by the patient prior to current illness, exacerbation or injury?  Cane prn   Current Functional Level Cognition   Arousal/Alertness: Awake/alert Overall Cognitive Status: Within Functional Limits for tasks assessed Current Attention Level: Sustained Orientation Level: Oriented X4 Following Commands: Follows one step commands with increased time Safety/Judgement: Decreased awareness of safety, Decreased awareness of deficits General Comments: Pt lack full insight of deficits and has overall poor safety awareness. He is however able to follow commands consistently with increased processing time. Is oriented to hospital but  needed reminders of why. Very motivated and pleasant Attention: Divided Divided Attention: Impaired Divided Attention Impairment: Verbal basic Memory: Impaired Memory Impairment: Decreased short term memory, Decreased recall of new information Decreased Short Term Memory: Verbal basic (limited recall for education completed for use of call bell following 10 min delay) Awareness: Impaired Problem Solving: Impaired Problem Solving Impairment: Verbal basic Executive Function: Self Monitoring Self Monitoring: Impaired (mod verbal cues for identification and clarification for impact of physical deficits) Self Monitoring Impairment: Verbal basic Safety/Judgment: Impaired (1/4 safety prompts (inc to 3/4 with min verbal cues))    Extremity Assessment (includes Sensation/Coordination)   Upper Extremity Assessment: Defer to OT evaluation RUE Sensation: WNL RUE Coordination: WNL LUE Deficits / Details: AROM: shoulder flexion: to approx 80 degrees, elbow flexion/extension approx 3/4 full AROM, wrist approx 3/4 full AROM PROM appears WFL; no tone noted at this time; sensation/proprioception appear WFL, will continue to assess LUE Sensation: WNL LUE Coordination: decreased fine motor, decreased gross motor  Lower Extremity Assessment: LLE deficits/detail RLE Sensation: WNL RLE Coordination: WNL LLE Deficits / Details: 3+/5 hip flexor and 3/5 knee extension/DF. LLE Sensation: WNL LLE Coordination: decreased gross motor     ADLs   Overall ADL's : Needs assistance/impaired Eating/Feeding Details (indicate cue type and reason): MIN A for setup with difficulty with small containers/packages/packets 2/2 FMC, strength, and sensory deficits Grooming: Standing, Min guard, Set up, Wash/dry face, Wash/dry hands Grooming Details (indicate cue type and reason): VC for keeping UE support  on counter for improved stability/safety, CGA-MIN A for standing balance but only set up and CGA for grooming task Upper  Body Dressing : Minimal assistance, Sitting Upper Body Dressing Details (indicate cue type and reason): gown Lower Body Dressing: Maximal assistance, Sit to/from stand, Cueing for sequencing Toilet Transfer: Moderate assistance, Ambulation, Rolling walker (2 wheels) Toilet Transfer Details (indicate cue type and reason): simulated, heavy L lateral lean, Step by step vcs for technique Toileting- Clothing Manipulation and Hygiene: Maximal assistance, Sit to/from stand, Cueing for sequencing Functional mobility during ADLs: Minimal assistance, Rolling walker (2 wheels) General ADL Comments: SETUP face washing seated in chair. MIN A + RW for simulated toilet t/f     Mobility   Overal bed mobility: Needs Assistance Bed Mobility: Supine to Sit Supine to sit: Min guard Sit to supine: Supervision General bed mobility comments: received and left in bed     Transfers   Overall transfer level: Needs assistance Equipment used: Rolling walker (2 wheels) Transfers: Sit to/from Stand Sit to Stand: Min assist General transfer comment: from low chair height     Ambulation / Gait / Stairs / Wheelchair Mobility   Ambulation/Gait Ambulation/Gait assistance: Editor, commissioning (Feet): 30 Feet Assistive device: Rolling walker (2 wheels) Gait Pattern/deviations: Step-through pattern, Trunk flexed, Narrow base of support, Step-to pattern, Decreased step length - left, Ataxic General Gait Details: Pt ambulated 1 x 20 ft then 1 x 30 ft. He ambulated to BR with min assist + vcs for posture correction and improve step quality. Gait velocity: decreased     Posture / Balance Dynamic Sitting Balance Sitting balance - Comments: no LOB noted in sitting Balance Overall balance assessment: Needs assistance Sitting-balance support: Feet supported Sitting balance-Leahy Scale: Good Sitting balance - Comments: no LOB noted in sitting Postural control: Left lateral lean Standing balance support: Bilateral  upper extremity supported, Reliant on assistive device for balance Standing balance-Leahy Scale: Poor Standing balance comment: fair static standing however poor dynamic standing balance     Special needs/care consideration      Previous Home Environment  Living Arrangements: Children (Daughter, Idell Pickles and her 2 sons 12 and 32 years old live with him)  Lives With: Family (Family live in his home) Available Help at Discharge: Family, Available 24 hours/day Type of Home: House Home Layout: One level Home Access: Level entry Bathroom Shower/Tub: Health visitor: Standard Bathroom Accessibility: Yes How Accessible: Accessible via walker Home Care Services: No   Discharge Living Setting Plans for Discharge Living Setting: Patient's home (daughter, Idell Pickles and her 2 sons live with him) Type of Home at Discharge: House Discharge Home Layout: One level Discharge Home Access: Level entry Discharge Bathroom Shower/Tub: Walk-in shower Discharge Bathroom Toilet: Standard Discharge Bathroom Accessibility: Yes How Accessible: Accessible via walker Does the patient have any problems obtaining your medications?: No   Social/Family/Support Systems Patient Roles: Parent Contact Information: daughters/POA Bonita Quin and Management consultant Anticipated Caregiver: Idell Pickles and her 2 sons Anticipated Industrial/product designer Information: see contacts Ability/Limitations of Caregiver: no limitations Caregiver Availability: 24/7 Discharge Plan Discussed with Primary Caregiver: Yes Is Caregiver In Agreement with Plan?: Yes Does Caregiver/Family have Issues with Lodging/Transportation while Pt is in Rehab?: No   Goals Patient/Family Goal for Rehab: min assist with PT and OT, supervision with SLP Expected length of stay: ELOS 10 to 14 days Pt/Family Agrees to Admission and willing to participate: Yes Program Orientation Provided & Reviewed with Pt/Caregiver Including Roles  & Responsibilities: Yes   Decrease  burden  of Care through IP rehab admission: n/a   Possible need for SNF placement upon discharge:not anticipated   Patient Condition: This patient's medical and functional status has not changed since the consult dated: 11/27/21 in which the Rehabilitation Physician determined and documented that the patient's condition is appropriate for intensive rehabilitative care in an inpatient rehabilitation facility. See "History of Present Illness" (above) for medical update. Functional changes are: min to mod assist overall. Patient's medical and functional status update has been discussed with the Rehabilitation physician and patient remains appropriate for inpatient rehabilitation. Will admit to inpatient rehab today.   Preadmission Screen Completed By:  Clois Dupes, RN, 12/02/2021 10:26 AM ______________________________________________________________________   Discussed status with Dr. Natale Lay on 12/02/21 at 1003 and received approval for admission today.   Admission Coordinator:  Clois Dupes, time 9935 Date 12/02/21            Revision History                                               Note Details  Author Kevin Dance, MD File Time 12/02/2021 10:41 AM  Author Type Physician Status Signed  Last Editor Kevin Dance, MD Service [facility] Acct # [id number] Admit Date 12/02/2021

## 2021-12-02 NOTE — Progress Notes (Signed)
Report given to [place], RN on 4W at [facility]

## 2021-12-02 NOTE — Progress Notes (Signed)
Patient arrived  via [facility] from [facility];oriented to unit, skin assessment completed x2 RN. No questions

## 2021-12-02 NOTE — Discharge Instructions (Addendum)
Inpatient Rehab Discharge Instructions  Kevin [name] Discharge date and time: No discharge date for patient encounter.   Activities/Precautions/ Functional Status: Activity: activity as tolerated Diet: regular diet Wound Care: Routine skin checks Functional status:  ___ No restrictions     ___ Walk up steps independently ___ 24/7 supervision/assistance   ___ Walk up steps with assistance ___ Intermittent supervision/assistance  ___ Bathe/dress independently ___ Walk with walker     _x__ Bathe/dress with assistance ___ Walk Independently    ___ Shower independently ___ Walk with assistance    ___ Shower with assistance ___ No alcohol     ___ Return to work/school ________   COMMUNITY REFERRALS UPON DISCHARGE:    Home Health:   PT      OT      ST                     Agency:[facility]      Phone:[phone]  *Please expect follow-up within 2-3 days to schedule your home visit. If you have not received follow-up, be sure to contact the branch directly.*   Medical Equipment/Items Ordered: shower chair with back                                                 Agency/Supplier: [employer] [facility] [phone]    Special Instructions: No driving smoking or alcohol   STROKE/TIA DISCHARGE INSTRUCTIONS SMOKING Cigarette smoking nearly doubles your risk of having a stroke & is the single most alterable risk factor  If you smoke or have smoked in the last 12 months, you are advised to quit smoking for your health. Most of the excess cardiovascular risk related to smoking disappears within a year of stopping. Ask you doctor about anti-smoking medications [place] Quit Line: 1-800-QUIT NOW Free Smoking Cessation Classes [phone]  CHOLESTEROL Know your levels; limit fat & cholesterol in your diet  Lipid Panel     Component Value Date/Time   CHOL 146 11/26/2021 0459   TRIG 84 11/26/2021 0459   HDL 31 (L) 11/26/2021 0459   CHOLHDL 4.7 11/26/2021 0459   VLDL 17 11/26/2021 0459    LDLCALC 98 11/26/2021 0459     Many patients benefit from treatment even if their cholesterol is at goal. Goal: Total Cholesterol (CHOL) less than 160 Goal:  Triglycerides (TRIG) less than 150 Goal:  HDL greater than 40 Goal:  LDL (LDLCALC) less than 100   BLOOD PRESSURE American Stroke Association blood pressure target is less that 120/80 mm/Hg  Your discharge blood pressure is:    Monitor your blood pressure Limit your salt and alcohol intake Many individuals will require more than one medication for high blood pressure  DIABETES (A1c is a blood sugar average for last 3 months) Goal HGBA1c is under 7% (HBGA1c is blood sugar average for last 3 months)  Diabetes: No known diagnosis of diabetes    Lab Results  Component Value Date   HGBA1C 5.7 (H) 11/25/2021    Your HGBA1c can be lowered with medications, healthy diet, and exercise. Check your blood sugar as directed by your physician Call your physician if you experience unexplained or low blood sugars.  PHYSICAL ACTIVITY/REHABILITATION Goal is 30 minutes at least 4 days per week  Activity: Increase activity slowly, Therapies: Physical Therapy: Home Health Return to work:  Activity decreases  your risk of heart attack and stroke and makes your heart stronger.  It helps control your weight and blood pressure; helps you relax and can improve your mood. Participate in a regular exercise program. Talk with your doctor about the best form of exercise for you (dancing, walking, swimming, cycling).  DIET/WEIGHT Goal is to maintain a healthy weight  Your discharge diet is:  Diet Order     None       liquids Your height is:    Your current weight is:   Your Body Mass Index (BMI) is:    Following the type of diet specifically designed for you will help prevent another stroke. Your goal weight range is:   Your goal Body Mass Index (BMI) is 19-24. Healthy food habits can help reduce 3 risk factors for stroke:  High cholesterol,  hypertension, and excess weight.  RESOURCES Stroke/Support Group:  Call [phone]   STROKE EDUCATION PROVIDED/REVIEWED AND GIVEN TO PATIENT Stroke warning signs and symptoms How to activate emergency medical system (call 911). Medications prescribed at discharge. Need for follow-up after discharge. Personal risk factors for stroke. Pneumonia vaccine given: No Flu vaccine given: No My questions have been answered, the writing is legible, and I understand these instructions.  I will adhere to these goals & educational materials that have been provided to me after my discharge from the hospital.       My questions have been answered and I understand these instructions. I will adhere to these goals and the provided educational materials after my discharge from the hospital.  Patient/Caregiver Signature _______________________________ Date __________  Clinician Signature _______________________________________ Date __________  Please bring this form and your medication list with you to all your follow-up doctor's appointments.

## 2021-12-02 NOTE — TOC Transition Note (Signed)
Transition of Care [facility]) - CM/SW Discharge Note   Patient Details  Name: Kevin [name] MRN: [id number] Date of Birth: 08/12/1933  Transition of Care [facility]) CM/SW Contact:  Alberteen Sam, LCSW Phone Number: 12/02/2021, 11:26 AM   Clinical Narrative:     [facility] transport forms put on patient's hard chart, no other TOC needs at this time. CSW signing off.   Final next level of care: IP Rehab Facility Barriers to Discharge: No Barriers Identified   Patient Goals and CMS Choice        Discharge Placement                       Discharge Plan and Services                                     Social Determinants of Health (SDOH) Interventions     Readmission Risk Interventions     No data to display

## 2021-12-02 NOTE — Progress Notes (Signed)
Inpatient Rehabilitation Admissions Coordinator   We plan admit to [facility] rehab [facility] today. Dr Curlene Dolphin will be rehab MD to admit. I will arrange [facility] transport when bed is available and communicate this to the team.   Danne Baxter, RN, MSN Rehab Admissions Coordinator [phone] 12/02/2021 8:41 AM

## 2021-12-02 NOTE — Progress Notes (Signed)
Physical Therapy Treatment Patient Details Name: Kevin [name] MRN: [id number] DOB: [birth year] Today's Date: 12/02/2021   History of Present Illness Pt is an 88 year old male admitted with acute R thalamic ischemic infarct; PMH significant for HTN, CAD, anemia    PT Comments    Pt was long sitting in bed, awake, and oriented x 3. He is motivated to improve and states personal goal of wanting to be able to return home and be able to mow his grass again. Pt did need to be reoriented to reason for being in the hospital and that he is going to be going to rehab prior to returning home.  Much improved strength, balance, and safety since last observed by author previous week. Pt does continue to be high fall risk and is far from baseline abilities. Performed EOB STS 5 x with surface height progressively lowered. Pt does still require assistance to safely stand with poor carryover between trials. Ambulated to BR, successfully urinated, prior to ambulating ~ 30 more ft. Poor gait posture/standing posture throughout session with constant vcs for correction. He continues to struggle with LLE placement during gait. Performed several standing exercises that focused on LLE heel strike and placement during advancement of LE in sanding. Overall pt is progressing well. Still a great CIR candidate to maximize independence while assisting pt towards PLOF.    Recommendations for follow up therapy are one component of a multi-disciplinary discharge planning process, led by the attending physician.  Recommendations may be updated based on patient status, additional functional criteria and insurance authorization.  Follow Up Recommendations  Acute inpatient rehab (3hours/day)     Assistance Recommended at Discharge Intermittent Supervision/Assistance  Patient can return home with the following A little help with walking and/or transfers;A little help with bathing/dressing/bathroom;Assistance with  cooking/housework;Direct supervision/assist for medications management;Direct supervision/assist for financial management;Assist for transportation;Help with stairs or ramp for entrance   Equipment Recommendations  Other (comment) (defer to next level of care)       Precautions / Restrictions Precautions Precautions: Fall Restrictions Weight Bearing Restrictions: No     Mobility  Bed Mobility Overal bed mobility: Needs Assistance Bed Mobility: Supine to Sit  Supine to sit: Min guard  General bed mobility comments: CGA from flat bed surface. Vcs for improved technique. CGA for tactle cueing    Transfers Overall transfer level: Needs assistance Equipment used: Rolling walker (2 wheels) Transfers: Sit to/from Stand Sit to Stand: Min assist  General transfer comment: Pt performed STS 5 x total throughout session. lowered surface height each STS with min assist form lowest bed surface. Vcs for handplacement and fwd wt shift. Poor carry over between trials    Ambulation/Gait Ambulation/Gait assistance: Min assist Gait Distance (Feet): 30 Feet Assistive device: Rolling walker (2 wheels) Gait Pattern/deviations: Step-through pattern, Trunk flexed, Narrow base of support, Step-to pattern, Decreased step length - left, Ataxic Gait velocity: decreased     General Gait Details: Pt ambulated 1 x 20 ft then 1 x 30 ft. He ambulated to BR with min assist + vcs for posture correction and improve step quality.    Balance Overall balance assessment: Needs assistance Sitting-balance support: Feet supported Sitting balance-Leahy Scale: Good Sitting balance - Comments: no LOB noted in sitting   Standing balance support: Bilateral upper extremity supported, During functional activity Standing balance-Leahy Scale: Poor Standing balance comment: fair static standing however poor dynamic standing balance       Cognition Arousal/Alertness: Awake/alert Behavior During Therapy: WFL for tasks  assessed/performed Overall Cognitive Status: Within Functional Limits for tasks assessed Area of Impairment: Problem solving, Awareness    Orientation Level: Disoriented to, Situation       Safety/Judgement: Decreased awareness of safety, Decreased awareness of deficits Awareness: Emergent Problem Solving: Slow processing, Requires verbal cues, Requires tactile cues General Comments: Pt lack full insight of deficits and has overall poor safety awareness. He is however able to follow commands consistently with increased processing time. Is oriented to hospital but needed reminders of why. Very motivated and pleasant               Pertinent Vitals/Pain Pain Assessment Pain Assessment: No/denies pain Pain Score: 0-No pain     PT Goals (current goals can now be found in the care plan section) Acute Rehab PT Goals Patient Stated Goal: "Get back to mowing my grass." Progress towards PT goals: Progressing toward goals    Frequency    7X/week      PT Plan Current plan remains appropriate    Co-evaluation     PT goals addressed during session: Mobility/safety with mobility;Balance;Proper use of DME;Strengthening/ROM        AM-PAC PT "6 Clicks" Mobility   Outcome Measure  Help needed turning from your back to your side while in a flat bed without using bedrails?: A Little Help needed moving from lying on your back to sitting on the side of a flat bed without using bedrails?: A Little Help needed moving to and from a bed to a chair (including a wheelchair)?: A Little Help needed standing up from a chair using your arms (e.g., wheelchair or bedside chair)?: A Little Help needed to walk in hospital room?: A Little Help needed climbing 3-5 steps with a railing? : A Lot 6 Click Score: 17    End of Session Equipment Utilized During Treatment: Gait belt Activity Tolerance: Patient tolerated treatment well Patient left: in chair;with call bell/phone within reach;with  family/visitor present Nurse Communication: Mobility status PT Visit Diagnosis: Unsteadiness on feet (R26.81);Muscle weakness (generalized) (M62.81);Difficulty in walking, not elsewhere classified (R26.2);Hemiplegia and hemiparesis Hemiplegia - Right/Left: Left Hemiplegia - dominant/non-dominant: Non-dominant Hemiplegia - caused by: Cerebral infarction     Time: 6712-4580 PT Time Calculation (min) (ACUTE ONLY): 25 min  Charges:  $Gait Training: 8-22 mins $Therapeutic Activity: 8-22 mins                     Julaine Fusi PTA 12/02/21, 8:31 AM

## 2021-12-02 NOTE — Discharge Summary (Signed)
Physician Discharge Summary   Patient: Kevin [name] MRN: [id number]  DOB: 11-Nov-1933   Admit:     Date of Admission: 11/25/2021 Admitted from: home   Discharge: Date of discharge: 12/02/21 Disposition: Rehabilitation facility Condition at discharge: good  CODE STATUS: FULL CODE     Discharge Physician: Emeterio Reeve, DO [facility]     PCP: Leonel Ramsay, MD  Recommendations for Outpatient Follow-up:  Follow up with PCP Leonel Ramsay, MD in 2-4 weeks Follow up with neurology 2-4 weeks Please obtain labs/tests: as needed Please follow up on the following pending results: none  Discharge Instructions     Ambulatory referral to Neurology   Complete by: As directed    Diet - low sodium heart healthy   Complete by: As directed    Increase activity slowly   Complete by: As directed          Discharge Diagnoses: Principal Problem:   Acute CVA (cerebrovascular accident) [facility]) Active Problems:   Essential hypertension   CAD (coronary artery disease)   Consultants:  neurology   Procedures:  none   Hospital Course: Kevin [name] is a 88 y.o. male with medical history significant of  HTN, CAD non obstructive, Anemia  who presents to ED with complaint of left sided weakness . Per patient this am on  waking around 1 am he felt he was not able to move his arm or left leg. He states he attempted to ambulate and fell.  He notes no associated HA, dizzy , chest pain , slurred speech, confusion, difficulty swallowing or prior symptoms like this in the past. Found with acute infarct right lateral thalamus on MRI . Neurology was consulted. Patient stable, awaiting CIR bed.    Acute infarct right lateral thalamus MRI head  + for right thalamic cva Neurology consulted BP control ASA 81mg  daily + plavix 75mg  daily x21 days f/b ASA 81mg  daily monotherapy after that high intensity statin 9/28 EEG nml.  Stroke education Amb referral to neurology  upon discharge. LDL 98, goal <70 10/3 medically stable awaiting CIR bed availability   HTN Initially had permissive hypertension now needed BP control Will increase hydralazine to 25 mg 3 times daily   CAD  -no active issues 10/3 asymptomatic    anemia At baseline   Recent Oral surgery -full mouth teeth extraction  -complete post procedure out patient Abx                  Discharge Instructions  Allergies as of 12/02/2021   No Known Allergies      Medication List     STOP taking these medications    amoxicillin 500 MG tablet Commonly known as: AMOXIL   chlorhexidine 0.12 % solution Commonly known as: PERIDEX   ibuprofen 600 MG tablet Commonly known as: ADVIL   triamterene-hydrochlorothiazide 37.5-25 MG tablet Commonly known as: MAXZIDE-25       TAKE these medications    acetaminophen 325 MG tablet Commonly known as: TYLENOL Take 2 tablets (650 mg total) by mouth every 4 (four) hours as needed for mild pain (or temp > 37.5 C (99.5 F)).   alum & mag hydroxide-simeth 200-200-20 MG/5ML suspension Commonly known as: MAALOX/MYLANTA Take 30 mLs by mouth every 6 (six) hours as needed for indigestion or heartburn.   amLODipine 10 MG tablet Commonly known as: NORVASC Take 10 mg by mouth daily.   aspirin 81 MG chewable tablet Chew 1 tablet (81 mg  total) by mouth daily. Start taking on: December 03, 2021   atorvastatin 40 MG tablet Commonly known as: LIPITOR Take 1 tablet (40 mg total) by mouth every evening.   clopidogrel 75 MG tablet Commonly known as: PLAVIX Take 1 tablet (75 mg total) by mouth daily. Start taking on: December 03, 2021   hydrALAZINE 25 MG tablet Commonly known as: APRESOLINE Take 1 tablet (25 mg total) by mouth every 8 (eight) hours.   hydrocerin Crea Apply 1 Application topically 2 (two) times daily as needed (dry skin).   pantoprazole 20 MG tablet Commonly known as: PROTONIX Take 1 tablet (20 mg total) by mouth  daily. Start taking on: December 03, 2021   polyethylene glycol 17 g packet Commonly known as: MIRALAX / GLYCOLAX Take 17 g by mouth 2 (two) times daily as needed for moderate constipation.   senna-docusate 8.6-50 MG tablet Commonly known as: Senokot-S Take 1 tablet by mouth at bedtime.          No Known Allergies   Subjective: pt feeling well, hasn't had BM in a bit but feels like he might have to go soon. No pain, no new weakness. No concerns from family at bedside.    Discharge Exam: BP (!) 151/96 (BP Location: Right Arm)   Pulse 77   Temp 98.3 F (36.8 C)   Resp 16   Ht 6\' 2"  (1.88 m)   Wt 73.5 kg   SpO2 100%   BMI 20.81 kg/m  General: Pt is alert, awake, not in acute distress Cardiovascular: RRR, S1/S2 +, no rubs, no gallops Respiratory: CTA bilaterally, no wheezing, no rhonchi Abdominal: Soft, NT, ND, bowel sounds + Extremities: no edema, no cyanosis     The results of significant diagnostics from this hospitalization (including imaging, microbiology, ancillary and laboratory) are listed below for reference.     Microbiology: Recent Results (from the past 240 hour(s))  Resp Panel by RT-PCR (Flu A&B, Covid) Anterior Nasal Swab     Status: None   Collection Time: 11/25/21  2:35 PM   Specimen: Anterior Nasal Swab  Result Value Ref Range Status   SARS Coronavirus 2 by RT PCR NEGATIVE NEGATIVE Final    Comment: (NOTE) SARS-CoV-2 target nucleic acids are NOT DETECTED.  The SARS-CoV-2 RNA is generally detectable in upper respiratory specimens during the acute phase of infection. The lowest concentration of SARS-CoV-2 viral copies this assay can detect is 138 copies/mL. A negative result does not preclude SARS-Cov-2 infection and should not be used as the sole basis for treatment or other patient management decisions. A negative result may occur with  improper specimen collection/handling, submission of specimen other than nasopharyngeal swab, presence of  viral mutation(s) within the areas targeted by this assay, and inadequate number of viral copies(<138 copies/mL). A negative result must be combined with clinical observations, patient history, and epidemiological information. The expected result is Negative.  Fact Sheet for Patients:  EntrepreneurPulse.com.au  Fact Sheet for Healthcare Providers:  IncredibleEmployment.be  This test is no t yet approved or cleared by the [place] FDA and  has been authorized for detection and/or diagnosis of SARS-CoV-2 by FDA under an Emergency Use Authorization (EUA). This EUA will remain  in effect (meaning this test can be used) for the duration of the COVID-19 declaration under Section 564(b)(1) of the Act, 21 U.S.C.section 360bbb-3(b)(1), unless the authorization is terminated  or revoked sooner.       Influenza A by PCR NEGATIVE NEGATIVE Final   Influenza  B by PCR NEGATIVE NEGATIVE Final    Comment: (NOTE) The Xpert Xpress SARS-CoV-2/FLU/RSV plus assay is intended as an aid in the diagnosis of influenza from Nasopharyngeal swab specimens and should not be used as a sole basis for treatment. Nasal washings and aspirates are unacceptable for Xpert Xpress SARS-CoV-2/FLU/RSV testing.  Fact Sheet for Patients: EntrepreneurPulse.com.au  Fact Sheet for Healthcare Providers: IncredibleEmployment.be  This test is not yet approved or cleared by the [place] FDA and has been authorized for detection and/or diagnosis of SARS-CoV-2 by FDA under an Emergency Use Authorization (EUA). This EUA will remain in effect (meaning this test can be used) for the duration of the COVID-19 declaration under Section 564(b)(1) of the Act, 21 U.S.C. section 360bbb-3(b)(1), unless the authorization is terminated or revoked.  Performed at [facility], [address] [place], [place] [address]      Labs: BNP (last 3  results) No results for input(s): "BNP" in the last 8760 hours. Basic Metabolic Panel: Recent Labs  Lab 11/25/21 1435  NA 139  K 3.6  CL 107  CO2 22  GLUCOSE 97  BUN 18  CREATININE 0.92  CALCIUM 8.6*   Liver Function Tests: Recent Labs  Lab 11/25/21 1435  AST 20  ALT 18  ALKPHOS 60  BILITOT 0.5  PROT 7.2  ALBUMIN 3.4*   No results for input(s): "LIPASE", "AMYLASE" in the last 168 hours. No results for input(s): "AMMONIA" in the last 168 hours. CBC: Recent Labs  Lab 11/25/21 1435  WBC 4.6  NEUTROABS 3.0  HGB 12.1*  HCT 37.7*  MCV 85.1  PLT 265   Cardiac Enzymes: No results for input(s): "CKTOTAL", "CKMB", "CKMBINDEX", "TROPONINI" in the last 168 hours. BNP: Invalid input(s): "POCBNP" CBG: Recent Labs  Lab 11/25/21 1210  GLUCAP 99   D-Dimer No results for input(s): "DDIMER" in the last 72 hours. Hgb A1c No results for input(s): "HGBA1C" in the last 72 hours. Lipid Profile No results for input(s): "CHOL", "HDL", "LDLCALC", "TRIG", "CHOLHDL", "LDLDIRECT" in the last 72 hours. Thyroid function studies No results for input(s): "TSH", "T4TOTAL", "T3FREE", "THYROIDAB" in the last 72 hours.  Invalid input(s): "FREET3" Anemia work up No results for input(s): "VITAMINB12", "FOLATE", "FERRITIN", "TIBC", "IRON", "RETICCTPCT" in the last 72 hours. Urinalysis    Component Value Date/Time   COLORURINE STRAW (A) 11/25/2021 1755   APPEARANCEUR CLEAR (A) 11/25/2021 1755   APPEARANCEUR Clear 12/06/2012 1252   LABSPEC 1.023 11/25/2021 1755   LABSPEC 1.013 12/06/2012 1252   PHURINE 6.0 11/25/2021 1755   GLUCOSEU NEGATIVE 11/25/2021 1755   GLUCOSEU Negative 12/06/2012 1252   HGBUR SMALL (A) 11/25/2021 1755   BILIRUBINUR NEGATIVE 11/25/2021 1755   BILIRUBINUR Negative 12/06/2012 1252   KETONESUR NEGATIVE 11/25/2021 1755   PROTEINUR NEGATIVE 11/25/2021 1755   NITRITE NEGATIVE 11/25/2021 1755   LEUKOCYTESUR NEGATIVE 11/25/2021 1755   LEUKOCYTESUR Negative  12/06/2012 1252   Sepsis Labs Recent Labs  Lab 11/25/21 1435  WBC 4.6   Microbiology Recent Results (from the past 240 hour(s))  Resp Panel by RT-PCR (Flu A&B, Covid) Anterior Nasal Swab     Status: None   Collection Time: 11/25/21  2:35 PM   Specimen: Anterior Nasal Swab  Result Value Ref Range Status   SARS Coronavirus 2 by RT PCR NEGATIVE NEGATIVE Final    Comment: (NOTE) SARS-CoV-2 target nucleic acids are NOT DETECTED.  The SARS-CoV-2 RNA is generally detectable in upper respiratory specimens during the acute phase of infection. The lowest concentration of  SARS-CoV-2 viral copies this assay can detect is 138 copies/mL. A negative result does not preclude SARS-Cov-2 infection and should not be used as the sole basis for treatment or other patient management decisions. A negative result may occur with  improper specimen collection/handling, submission of specimen other than nasopharyngeal swab, presence of viral mutation(s) within the areas targeted by this assay, and inadequate number of viral copies(<138 copies/mL). A negative result must be combined with clinical observations, patient history, and epidemiological information. The expected result is Negative.  Fact Sheet for Patients:  EntrepreneurPulse.com.au  Fact Sheet for Healthcare Providers:  IncredibleEmployment.be  This test is no t yet approved or cleared by the [place] FDA and  has been authorized for detection and/or diagnosis of SARS-CoV-2 by FDA under an Emergency Use Authorization (EUA). This EUA will remain  in effect (meaning this test can be used) for the duration of the COVID-19 declaration under Section 564(b)(1) of the Act, 21 U.S.C.section 360bbb-3(b)(1), unless the authorization is terminated  or revoked sooner.       Influenza A by PCR NEGATIVE NEGATIVE Final   Influenza B by PCR NEGATIVE NEGATIVE Final    Comment: (NOTE) The Xpert Xpress  SARS-CoV-2/FLU/RSV plus assay is intended as an aid in the diagnosis of influenza from Nasopharyngeal swab specimens and should not be used as a sole basis for treatment. Nasal washings and aspirates are unacceptable for Xpert Xpress SARS-CoV-2/FLU/RSV testing.  Fact Sheet for Patients: EntrepreneurPulse.com.au  Fact Sheet for Healthcare Providers: IncredibleEmployment.be  This test is not yet approved or cleared by the [place] FDA and has been authorized for detection and/or diagnosis of SARS-CoV-2 by FDA under an Emergency Use Authorization (EUA). This EUA will remain in effect (meaning this test can be used) for the duration of the COVID-19 declaration under Section 564(b)(1) of the Act, 21 U.S.C. section 360bbb-3(b)(1), unless the authorization is terminated or revoked.  Performed at [facility], [address] [place], [place] [address]    Imaging ECHOCARDIOGRAM COMPLETE  Result Date: 11/26/2021    ECHOCARDIOGRAM REPORT   Patient Name:   BEOWULF REPETTI [facility] Date of Exam: 11/26/2021 Medical Rec #:  [id number]     Height:       74.0 in Accession #:    [id number]    Weight:       190.0 lb Date of Birth:  1933/06/01      BSA:          2.127 m Patient Age:    88 years      BP:           150/71 mmHg Patient Gender: M             HR:           75 bpm. Exam Location:  [facility] Procedure: 2D Echo, Color Doppler, Cardiac Doppler and Saline Contrast Bubble            Study Indications:     I63.9 Stroke  History:         Patient has no prior history of Echocardiogram examinations.                  CAD; Risk Factors:Hypertension.  Sonographer:     Charmayne Sheer Referring Phys:  [id number] SARA-MAIZ A THOMAS Diagnosing Phys: Yolonda Kida MD IMPRESSIONS  1. Left ventricular ejection fraction, by estimation, is 70 to 75%. The left ventricle has hyperdynamic function. The left ventricle has no regional wall motion abnormalities. Left  ventricular diastolic  parameters were normal.  2. Right ventricular systolic function is normal. The right ventricular size is normal.  3. The mitral valve is normal in structure. Trivial mitral valve regurgitation.  4. The aortic valve is normal in structure. Aortic valve regurgitation is not visualized. FINDINGS  Left Ventricle: Left ventricular ejection fraction, by estimation, is 70 to 75%. The left ventricle has hyperdynamic function. The left ventricle has no regional wall motion abnormalities. The left ventricular internal cavity size was normal in size. There is no left ventricular hypertrophy. Left ventricular diastolic parameters were normal. Right Ventricle: The right ventricular size is normal. No increase in right ventricular wall thickness. Right ventricular systolic function is normal. Left Atrium: Left atrial size was normal in size. Right Atrium: Right atrial size was normal in size. Pericardium: There is no evidence of pericardial effusion. Mitral Valve: The mitral valve is normal in structure. Trivial mitral valve regurgitation. Tricuspid Valve: The tricuspid valve is normal in structure. Tricuspid valve regurgitation is trivial. Aortic Valve: The aortic valve is normal in structure. Aortic valve regurgitation is not visualized. Aortic valve mean gradient measures 5.0 mmHg. Aortic valve peak gradient measures 9.5 mmHg. Aortic valve area, by VTI measures 1.96 cm. Pulmonic Valve: The pulmonic valve was normal in structure. Pulmonic valve regurgitation is not visualized. Aorta: The ascending aorta was not well visualized. IAS/Shunts: No atrial level shunt detected by color flow Doppler. Agitated saline contrast was given intravenously to evaluate for intracardiac shunting.  LEFT VENTRICLE PLAX 2D LVIDd:         4.20 cm   Diastology LVIDs:         2.20 cm   LV e' medial:    8.92 cm/s LV PW:         1.20 cm   LV E/e' medial:  9.0 LV IVS:        1.00 cm   LV e' lateral:   13.50 cm/s LVOT diam:     1.80 cm   LV E/e' lateral:  5.9 LV SV:         60 LV SV Index:   28 LVOT Area:     2.54 cm  RIGHT VENTRICLE RV Basal diam:  4.20 cm RV S prime:     18.70 cm/s TAPSE (M-mode): 2.8 cm LEFT ATRIUM             Index        RIGHT ATRIUM           Index LA diam:        3.70 cm 1.74 cm/m   RA Area:     16.40 cm LA Vol (A2C):   72.9 ml 34.28 ml/m  RA Volume:   44.30 ml  20.83 ml/m LA Vol (A4C):   85.4 ml 40.15 ml/m LA Biplane Vol: 80.2 ml 37.71 ml/m  AORTIC VALVE                     PULMONIC VALVE AV Area (Vmax):    1.95 cm      PV Vmax:       1.25 m/s AV Area (Vmean):   1.89 cm      PV Peak grad:  6.2 mmHg AV Area (VTI):     1.96 cm AV Vmax:           154.00 cm/s AV Vmean:          105.000 cm/s AV VTI:  0.306 m AV Peak Grad:      9.5 mmHg AV Mean Grad:      5.0 mmHg LVOT Vmax:         118.00 cm/s LVOT Vmean:        78.000 cm/s LVOT VTI:          0.236 m LVOT/AV VTI ratio: 0.77  AORTA Ao Root diam: 3.30 cm MITRAL VALVE               TRICUSPID VALVE MV Area (PHT): 3.19 cm    TR Peak grad:   30.0 mmHg MV Decel Time: 238 msec    TR Vmax:        274.00 cm/s MV E velocity: 80.20 cm/s MV A velocity: 79.70 cm/s  SHUNTS MV E/A ratio:  1.01        Systemic VTI:  0.24 m                            Systemic Diam: 1.80 cm Yolonda Kida MD Electronically signed by Yolonda Kida MD Signature Date/Time: 11/26/2021/5:38:16 PM    Final    EEG adult  Result Date: 11/26/2021 Derek Jack, MD     11/26/2021  3:48 PM Routine EEG Report ZURIEL ROSKOS is a 88 y.o. male with a history of altered mental status who is undergoing an EEG to evaluate for seizures. Report: This EEG was acquired with electrodes placed according to the International 10-20 electrode system (including Fp1, Fp2, F3, F4, C3, C4, P3, P4, O1, O2, T3, T4, T5, T6, A1, A2, Fz, Cz, Pz). The following electrodes were missing or displaced: none. The occipital dominant rhythm was 9 Hz. This activity is reactive to stimulation. Drowsiness was manifested by background  fragmentation; deeper stages of sleep were identified by K complexes and sleep spindles. There was no focal slowing. There were no interictal epileptiform discharges. There were no electrographic seizures identified. Photic stimulation and hyperventilation were not performed. Impression: This EEG was obtained while awake and asleep and is normal.   Clinical Correlation: Normal EEGs, however, do not rule out epilepsy. [name], MD [facility] [phone] If 7pm- 7am, please page neurology on call as listed in [place].      Time coordinating discharge: over 30 minutes  SIGNED:  Emeterio Reeve DO [facility]

## 2021-12-02 NOTE — Progress Notes (Signed)
Inpatient Rehabilitation  Medication Review by a Pharmacist  A complete drug regimen review was completed for this patient to identify any potential clinically significant medication issues.  High Risk Drug Classes Is patient taking? Indication by Medication  Antipsychotic No   Anticoagulant Yes Heparin SQ- VTE prophylaxis  Antibiotic No   Opioid No   Antiplatelet Yes ASA, Plavix- CVA   Hypoglycemics/insulin No   Vasoactive Medication Yes Amlodipine, Hydralazine-  Chemotherapy No   Other Yes Atorvastatin- HLD Protonix- GER     Type of Medication Issue Identified Description of Issue Recommendation(s)  Drug Interaction(s) (clinically significant)     Duplicate Therapy     Allergy     No Medication Administration End Date     Incorrect Dose     Additional Drug Therapy Needed     Significant med changes from prior encounter (inform family/care partners about these prior to discharge). Prior to admit Maxzide-25 discontinued on [facility] acute care admission and stopped on discharge orders 12/02/21.    Other       Clinically significant medication issues were identified that warrant physician communication and completion of prescribed/recommended actions by midnight of the next day:  No  Name of provider notified for urgent issues identified:   Provider Method of Notification:    Pharmacist comments:  [facility] Acute care discharge MD plans ASA 81mg  daily + plavix 75mg  daily x21 days f/b ASA 81mg  daily monotherapy after that.   Plavix started 9/28,  Last day for plavix due 12/16/21.  Stop date entered.   Time spent performing this drug regimen review (minutes):  [place], [place] Clinical Pharmacist [phone] 12/02/2021 3:10 PM Please check AMION for all [place] phone numbers After 10:00 PM, call [place] [phone]

## 2021-12-02 NOTE — Progress Notes (Signed)
Patient ID: Kevin [name], male   DOB: November 21, 1933, 88 y.o.   MRN: [id number] Met with the patient and daughter Kevin [name] Patient to review current situation, rehab process, team conference and plan of care. Discussed current medications, for secondary risks including HTN, HLD and CAD along with dietary modification recommendations. Reviewed DAPT x 3 wks per hospitalist then ASA solo. Continue to follow along to discharge to address educational needs to facilitate preparation for discharge. Margarito Liner

## 2021-12-02 NOTE — H&P (Addendum)
Physical Medicine and Rehabilitation Admission H&P   Chief Complaint Patient presents with  Fall/Weakness   HPI: Kevin [name]. Kevin [name] is an 88 year old right-handed male with history of hypertension, CAD/nonobstructive, chronic anemia as well as recent oral surgery with full mouth teeth extraction.  Per chart review patient lives with adult aged grandchildren.  1 level home.  Independent with occasional cane.  Independent ADLs/drives and does lawn care.  Presented to [facility]25/2021 with acute onset of left-sided weakness/transient confusion resulting a fall.  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  Patient did not receive tPA.  CT angiogram head and neck no emergent large vessel occlusion.  MRI cervical thoracic lumbar spine showed no acute changes.  No significant stenosis.  EEG negative for seizure.  Echocardiogram ejection fraction of 70 to 75% no wall motion abnormalities.  Admission chemistries unremarkable, hemoglobin 12.1, hemoglobin A1c 5.7, urine drug screen negative.  Neurology follow-up currently maintained on low-dose aspirin 81 mg daily and Plavix 75 mg daily for CVA prophylaxis x3 weeks then aspirin alone.  Subcutaneous heparin for DVT prophylaxis.  Tolerating a regular diet.  Reports he has not had a a recent BM but he was documented as having a large BM yesterday. He denies any pain. Reports his strength is gradually improving. Therapy evaluations completed due to patient left-sided weakness decreased functional mobility was admitted for a comprehensive rehab program.  Review of Systems  Constitutional:  Negative for chills and fever.  HENT:  Negative for hearing loss.   Eyes:  Negative for blurred vision and double vision.  Respiratory:  Negative for cough and shortness of breath.   Cardiovascular:  Negative for chest pain, palpitations and leg swelling.  Gastrointestinal:  Positive for constipation. Negative for nausea and vomiting.  Genitourinary:  Positive for  urgency. Negative for dysuria, flank pain and hematuria.  Musculoskeletal:  Positive for myalgias. Negative for joint pain.  Skin:  Negative for rash.  Neurological:  Positive for focal weakness. Negative for sensory change, weakness and headaches.  Psychiatric/Behavioral:  Negative for depression.        Age-related cognitive deficits  All other systems reviewed and are negative.  Past Medical History:  Diagnosis Date   Anemia    Colon polyps    Coronary artery disease    Hematuria    History of hiatal hernia    Hypertension    Past Surgical History:  Procedure Laterality Date   COLONOSCOPY WITH ESOPHAGOGASTRODUODENOSCOPY (EGD)     COLONOSCOPY WITH PROPOFOL N/A 09/27/2014   Procedure: COLONOSCOPY WITH PROPOFOL;  Surgeon: Scot Jun, MD;  Location: [facility] ENDOSCOPY;  Service: Endoscopy;  Laterality: N/A;   JOINT REPLACEMENT     History reviewed. No pertinent family history. Social History:  reports that he has never smoked. He does not have any smokeless tobacco history on file. He reports current alcohol use of about 5.0 standard drinks of alcohol per week. He reports that he does not use drugs. Allergies: No Known Allergies Medications Prior to Admission  Medication Sig Dispense Refill   acetaminophen (TYLENOL) 325 MG tablet Take 2 tablets (650 mg total) by mouth every 4 (four) hours as needed for mild pain (or temp > 37.5 C (99.5 F)).     alum & mag hydroxide-simeth (MAALOX/MYLANTA) 200-200-20 MG/5ML suspension Take 30 mLs by mouth every 6 (six) hours as needed for indigestion or heartburn. 355 mL 0   amLODipine (NORVASC) 10 MG tablet Take 10 mg by mouth daily.     [START  ON 12/03/2021] aspirin 81 MG chewable tablet Chew 1 tablet (81 mg total) by mouth daily.     atorvastatin (LIPITOR) 40 MG tablet Take 1 tablet (40 mg total) by mouth every evening.     [START ON 12/03/2021] clopidogrel (PLAVIX) 75 MG tablet Take 1 tablet (75 mg total) by mouth daily.     hydrALAZINE  (APRESOLINE) 25 MG tablet Take 1 tablet (25 mg total) by mouth every 8 (eight) hours.     hydrocerin (EUCERIN) CREA Apply 1 Application topically 2 (two) times daily as needed (dry skin).  0   [START ON 12/03/2021] pantoprazole (PROTONIX) 20 MG tablet Take 1 tablet (20 mg total) by mouth daily.     polyethylene glycol (MIRALAX / GLYCOLAX) 17 g packet Take 17 g by mouth 2 (two) times daily as needed for moderate constipation. 14 each 0   senna-docusate (SENOKOT-S) 8.6-50 MG tablet Take 1 tablet by mouth at bedtime.         Home: Home Living Family/patient expects to be discharged to:: Private residence Living Arrangements: Children (Daughter, Idell Pickles and her 2 sons 32 and 53 years old live with him) Available Help at Discharge: Family, Available 24 hours/day Type of Home: House Home Access: Level entry Home Layout: One level Bathroom Shower/Tub: Health visitor: Standard Bathroom Accessibility: Yes Home Equipment: Medical laboratory scientific officer - single point  Lives With: Family (Family live in his home)   Functional History: Prior Function Prior Level of Function : Independent/Modified Independent Mobility Comments: prn cane use community distances; one fall morning of admission ADLs Comments: MOD I-I in ADL/IADL, cooks, cleans, drives, grocery shops, mows his 2 Psychologist, occupational Status:  Mobility: Bed Mobility Overal bed mobility: Needs Assistance Bed Mobility: Supine to Sit Supine to sit: Min guard Sit to supine: Supervision General bed mobility comments: +time/effort, VC for sequencing Transfers Overall transfer level: Needs assistance Equipment used: Rolling walker (2 wheels) Transfers: Sit to/from Stand Sit to Stand: Min assist, Mod assist General transfer comment: VC for hand placement.  increased assist for lower surfaces Ambulation/Gait Ambulation/Gait assistance: Min assist Gait Distance (Feet): 100 Feet Assistive device: Rolling walker (2 wheels) Gait  Pattern/deviations: Step-through pattern, Trunk flexed, Narrow base of support, Step-to pattern, Decreased step length - left General Gait Details: 50; x 1, 100' x 1 with RW and mod verbal cues.  Continues with antalgic gait with varied step lengths often not bringing L foot through with cues to correct.  seems unaware without cues increasing risk for falls. Gait velocity: decreased   ADL: ADL Overall ADL's : Needs assistance/impaired Eating/Feeding Details (indicate cue type and reason): MIN A for setup with difficulty with small containers/packages/packets 2/2 FMC, strength, and sensory deficits Grooming: Standing, Min guard, Set up, Wash/dry face, [employer] Details (indicate cue type and reason): VC for keeping UE support on counter for improved stability/safety, CGA-MIN A for standing balance but only set up and CGA for grooming task Upper Body Dressing : Minimal assistance, Sitting Upper Body Dressing Details (indicate cue type and reason): gown Lower Body Dressing: Maximal assistance, Sit to/from stand, Cueing for sequencing Toilet Transfer: Moderate assistance, Ambulation, Rolling walker (2 wheels) Toilet Transfer Details (indicate cue type and reason): simulated, heavy L lateral lean, Step by step vcs for technique Toileting- Clothing Manipulation and Hygiene: Maximal assistance, Sit to/from stand, Cueing for sequencing Functional mobility during ADLs: Minimal assistance, Rolling walker (2 wheels)   Cognition: Cognition Overall Cognitive Status: Within Functional Limits for tasks assessed Arousal/Alertness: Awake/alert  Orientation Level: Oriented to person, Oriented to place, Oriented to situation, Disoriented to time Year: 2023 Month: March Attention: Divided Divided Attention: Impaired Divided Attention Impairment: Verbal basic Memory: Impaired Memory Impairment: Decreased short term memory, Decreased recall of new information Decreased Short Term Memory: Verbal  basic (limited recall for education completed for use of call bell following 10 min delay) Awareness: Impaired Problem Solving: Impaired Problem Solving Impairment: Verbal basic Executive Function: Self Monitoring Self Monitoring: Impaired (mod verbal cues for identification and clarification for impact of physical deficits) Self Monitoring Impairment: Verbal basic Safety/Judgment: Impaired (1/4 safety prompts (inc to 3/4 with min verbal cues)) Cognition Arousal/Alertness: Awake/alert Behavior During Therapy: WFL for tasks assessed/performed Overall Cognitive Status: Within Functional Limits for tasks assessed Area of Impairment: Problem solving, Awareness Orientation Level: Disoriented to, Time Current Attention Level: Sustained Following Commands: Follows one step commands with increased time Safety/Judgement: Decreased awareness of deficits, Decreased awareness of safety Awareness: Emergent Problem Solving: Slow processing, Requires verbal cues, Requires tactile cues General Comments: Pt lack full insight of deficits and has overall poor safety awareness. He is however able to follow commands consistently with increased processing time. Is oriented.  making jokes during session   Physical Exam: Blood pressure (!) 146/58, pulse 81, temperature 98 F (36.7 C), temperature source Oral, resp. rate 15, weight 72.5 kg, SpO2 100 %.   General: No apparent distress, sitting in bed HEENT: Head is normocephalic, atraumatic, PERRLA, EOMI, sclera anicteric, oral mucosa pink and moist, dentition absent Neck: Supple without JVD or lymphadenopathy Heart: Reg rate and rhythm. No murmurs rubs or gallops Chest: CTA bilaterally without wheezes, rales, or rhonchi; no distress Abdomen: Soft, non-tender, non-distended, bowel sounds positive. Extremities: No clubbing, cyanosis, or edema. Pulses are 2+ Psych: Pt's affect is appropriate. Pt is cooperative. Very pleasant. Skin: Clean and intact without  signs of breakdown Neuro:  Alert and oriented to person, place and situation. Not oriented to time. Follows simple commands. Can name and repeat. Short term memory abnormal. Slightly slow processing. CN 2-12 intact.  Sensation intact to LT in all 4 extemities Strength 5/5 in b/l UE Strength 5/5 in RUE Strength 4/5 proximal 4+/5 distal LLE FTN normal RUE, slightly decrease RUE DTR 2+ and symmetric throughout, no clonus Musculoskeletal: Hallus valgus b/l, joint swelling L knee but no tenderness, denies knee pain No abnormal tone noted Decreased intrinsic muscle bulk in b/l hands IV in LUE   No results found for this or any previous visit (from the past 48 hour(s)). No results found.    Blood pressure (!) 146/58, pulse 81, temperature 98 F (36.7 C), temperature source Oral, resp. rate 15, weight 72.5 kg, SpO2 100 %.  Medical Problem List and Plan: 1. Functional deficits secondary to right ischemic thalamic infarction  -patient may shower  -ELOS/Goals: 7-10 days, Min A with PT/OT, sup with SLP  -Admit to CIR 2.  Antithrombotics: -DVT/anticoagulation:  Pharmaceutical: Heparin  -antiplatelet therapy: Aspirin 81 mg daily and Plavix 75 mg daily x3 weeks then aspirin alone 3. Pain Management: Tylenol as needed 4. Mood/Behavior/Sleep: Provide emotional support  -antipsychotic agents: N/A 5. Neuropsych/cognition: This patient is capable of making decisions on his own behalf. 6. Skin/Wound Care: Routine skin checks 7. Fluids/Electrolytes/Nutrition: Routine in and outs with follow-up chemistries 8.  Hypertension.  Norvasc 10 mg daily, hydralazine 25 mg every 8 hours.  Monitor with increased mobility. Avoid hypotension 9.  Hyperlipidemia.  Lipitor. Heart healthy diet 10.  CAD/nonobstructive.  Continue aspirin Plavix.  No complaints of  chest pain 11.  Constipation.  MiraLAX twice daily, Senokot nightly.  -LBM 9/3 large, monitor 12. Poor dentition  -Dys 3 diet 13. Anemia- mild  -monitor  on repeat labs  Charlton Amor, PA-C 12/02/2021   I have personally performed a face to face diagnostic evaluation of this patient and formulated the key components of the plan.  Additionally, I have personally reviewed laboratory data, imaging studies, as well as relevant notes and concur with the physician assistant's documentation above.  The patient's status has not changed from the original H&P.  Any changes in documentation from the acute care chart have been noted above.  Fanny Dance, MD, [facility]    Fanny Dance, MD 12/02/2021

## 2021-12-03 DIAGNOSIS — R7989 Other specified abnormal findings of blood chemistry: Secondary | ICD-10-CM

## 2021-12-03 DIAGNOSIS — I6381 Other cerebral infarction due to occlusion or stenosis of small artery: Secondary | ICD-10-CM | POA: Diagnosis not present

## 2021-12-03 DIAGNOSIS — I10 Essential (primary) hypertension: Secondary | ICD-10-CM | POA: Diagnosis not present

## 2021-12-03 DIAGNOSIS — D649 Anemia, unspecified: Secondary | ICD-10-CM | POA: Diagnosis not present

## 2021-12-03 LAB — CBC WITH DIFFERENTIAL/PLATELET
Abs Immature Granulocytes: 0.03 10*3/uL (ref 0.00–0.07)
Basophils Absolute: 0 10*3/uL (ref 0.0–0.1)
Basophils Relative: 1 %
Eosinophils Absolute: 0.2 10*3/uL (ref 0.0–0.5)
Eosinophils Relative: 4 %
HCT: 33.5 % — ABNORMAL LOW (ref 39.0–52.0)
Hemoglobin: 10.9 g/dL — ABNORMAL LOW (ref 13.0–17.0)
Immature Granulocytes: 1 %
Lymphocytes Relative: 20 %
Lymphs Abs: 1.2 10*3/uL (ref 0.7–4.0)
MCH: 27.5 pg (ref 26.0–34.0)
MCHC: 32.5 g/dL (ref 30.0–36.0)
MCV: 84.4 fL (ref 80.0–100.0)
Monocytes Absolute: 0.5 10*3/uL (ref 0.1–1.0)
Monocytes Relative: 9 %
Neutro Abs: 3.7 10*3/uL (ref 1.7–7.7)
Neutrophils Relative %: 65 %
Platelets: 311 10*3/uL (ref 150–400)
RBC: 3.97 MIL/uL — ABNORMAL LOW (ref 4.22–5.81)
RDW: 14.5 % (ref 11.5–15.5)
WBC: 5.7 10*3/uL (ref 4.0–10.5)
nRBC: 0 % (ref 0.0–0.2)

## 2021-12-03 LAB — COMPREHENSIVE METABOLIC PANEL
ALT: 16 U/L (ref 0–44)
AST: 15 U/L (ref 15–41)
Albumin: 2.9 g/dL — ABNORMAL LOW (ref 3.5–5.0)
Alkaline Phosphatase: 52 U/L (ref 38–126)
Anion gap: 7 (ref 5–15)
BUN: 30 mg/dL — ABNORMAL HIGH (ref 8–23)
CO2: 25 mmol/L (ref 22–32)
Calcium: 9 mg/dL (ref 8.9–10.3)
Chloride: 107 mmol/L (ref 98–111)
Creatinine, Ser: 1.22 mg/dL (ref 0.61–1.24)
GFR, Estimated: 58 mL/min — ABNORMAL LOW (ref >60)
Glucose, Bld: 103 mg/dL — ABNORMAL HIGH (ref 70–99)
Potassium: 4.4 mmol/L (ref 3.5–5.1)
Sodium: 139 mmol/L (ref 135–145)
Total Bilirubin: 0.4 mg/dL (ref 0.3–1.2)
Total Protein: 6.1 g/dL — ABNORMAL LOW (ref 6.5–8.1)

## 2021-12-03 NOTE — Progress Notes (Signed)
Inpatient Rehabilitation Care Coordinator Assessment and Plan Patient Details  Name: Kevin [name] MRN: [id number] Date of Birth: 07-11-1933  Today's Date: 12/03/2021  Hospital Problems: Principal Problem:   Right thalamic infarction [facility])  Past Medical History:  Past Medical History:  Diagnosis Date   Anemia    Colon polyps    Coronary artery disease    Hematuria    History of hiatal hernia    Hypertension    Past Surgical History:  Past Surgical History:  Procedure Laterality Date   COLONOSCOPY WITH ESOPHAGOGASTRODUODENOSCOPY (EGD)     COLONOSCOPY WITH PROPOFOL N/A 09/27/2014   Procedure: COLONOSCOPY WITH PROPOFOL;  Surgeon: Manya Silvas, MD;  Location: [place];  Service: Endoscopy;  Laterality: N/A;   JOINT REPLACEMENT     Social History:  reports that he has never smoked. He does not have any smokeless tobacco history on file. He reports current alcohol use of about 5.0 standard drinks of alcohol per week. He reports that he does not use drugs.  Family / Support Systems Children: Psychiatric nurse (Daughter) Anticipated Caregiver: Theatre manager (Daughter) and 2 grandsons Ability/Limitations of Caregiver: none Caregiver Availability: 24/7 Family Dynamics: support from daughters and grandchildren  Social History Preferred language: English Religion: [employer] - How often do you need to have someone help you when you read instructions, pamphlets, or other written material from your doctor or pharmacy?: Never Writes: Yes   Abuse/Neglect Abuse/Neglect Assessment Can Be Completed: Yes Physical Abuse: Denies Verbal Abuse: Denies Sexual Abuse: Denies Exploitation of patient/patient's resources: Denies Self-Neglect: Denies  Patient response to: Social Isolation - How often do you feel lonely or isolated from those around you?: Never  Emotional Status Recent Psychosocial Issues: coping Psychiatric History: n/a Substance Abuse History: n/a  Patient  / Family Perceptions, Expectations & Goals Pt/Family understanding of illness & functional limitations: yes Premorbid pt/family roles/activities: Independent with Kasandra Knudsen, driving and yard work Anticipated changes in roles/activities/participation: daughters and 2 grandsons to assist at discharge Pt/family expectations/goals: Min A to [place]: None Premorbid Home Care/DME Agencies: Other (Comment) ([facility]) Transportation available at discharge: Family able to assist with transportation (Children/grandchildren) Is the patient able to respond to transportation needs?: Yes In the past 12 months, has lack of transportation kept you from medical appointments or from getting medications?: No In the past 12 months, has lack of transportation kept you from meetings, work, or from getting things needed for daily living?: No  Discharge Planning Living Arrangements: Children, Other relatives Support Systems: Children (Pattient lives with adult grandchildren) Type of Residence: Private residence (1 level home) Insurance Resources: Multimedia programmer (specify) ([employer] Medicare) Financial Resources: Family Support Financial Screen Referred: Yes Living Expenses: Lives with family Money Management: Family Does the patient have any problems obtaining your medications?: No Home Management: Independent Patient/Family Preliminary Plans: Family able to assist Care Coordinator Barriers to Discharge: Insurance for SNF coverage Care Coordinator Anticipated Follow Up Needs: HH/OP Expected length of stay: 10-14 Days  Clinical Impression  Sw met with patient and introduced self. Patient will discharge home with daughter and grandsons to provide assistance. 1 level home. No additional questions or concerns.  Dyanne Iha 12/03/2021, 12:07 PM

## 2021-12-03 NOTE — Progress Notes (Signed)
Horton Chin, MD  Physician Physical Medicine and Rehabilitation Consult Note     Signed Date of Service:  11/27/2021  7:32 PM  Related encounter: ED to Hosp-Admission (Discharged) from 11/25/2021 in [facility]   Signed      Expand All Collapse All          Physical Medicine and Rehabilitation Consult Reason for Consult: Assess candidacy for CIR Referring Physician: Lynn Ito, MD     HPI: Kevin [name] is a 88 y.o. male with a past medical history significant for HTN, non-obstructive CAD, and anemia, who presented to the ED with left sided weakness. He had woken at 1am and was unable to move his left arm and leg. He tried to ambulate and he fell. MRI in the ED showed acute right lateral thalamic infarction. Physical Medicine & Rehabilitation was consulted to assess candidacy for CIR.       ROS sleeping well at night, moving bowels regularly, denies pain, has left sided weakness but this is improving     Past Medical History:  Diagnosis Date   Anemia     Colon polyps     Coronary artery disease     Hematuria     History of hiatal hernia     Hypertension           Past Surgical History:  Procedure Laterality Date   COLONOSCOPY WITH ESOPHAGOGASTRODUODENOSCOPY (EGD)       COLONOSCOPY WITH PROPOFOL N/A 09/27/2014    Procedure: COLONOSCOPY WITH PROPOFOL;  Surgeon: Scot Jun, MD;  Location: [facility] ENDOSCOPY;  Service: Endoscopy;  Laterality: N/A;   JOINT REPLACEMENT        No family history on file. Social History:  reports that he has never smoked. He does not have any smokeless tobacco history on file. He reports that he does not drink alcohol and does not use drugs. Allergies: No Known Allergies       Medications Prior to Admission  Medication Sig Dispense Refill   amLODipine (NORVASC) 10 MG tablet Take 10 mg by mouth daily.       amoxicillin (AMOXIL) 500 MG tablet Take 500 mg by mouth 3 (three) times daily. (For 7 days)        chlorhexidine (PERIDEX) 0.12 % solution Use as directed 15 mLs in the mouth or throat 2 (two) times daily.       ibuprofen (ADVIL) 600 MG tablet Take 600 mg by mouth every 6 (six) hours as needed for mild pain or moderate pain.       triamterene-hydrochlorothiazide (MAXZIDE-25) 37.5-25 MG per tablet Take 1 tablet by mouth daily.          Home: Home Living Family/patient expects to be discharged to:: Private residence Living Arrangements: Children (Daughter, Idell Pickles and her 2 sons 1 and 86 years old live with him) Available Help at Discharge: Family, Available 24 hours/day Type of Home: House Home Access: Level entry Home Layout: One level Bathroom Shower/Tub: Health visitor: Standard Bathroom Accessibility: Yes Home Equipment: Medical laboratory scientific officer - single point  Lives With: Family (Family live in his home)  Functional History: Prior Function Prior Level of Function : Independent/Modified Independent Mobility Comments: prn cane use community distances; one fall morning of admission ADLs Comments: MOD I-I in ADL/IADL, cooks, cleans, drives, grocery shops, 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 Transfers Overall transfer level: Needs assistance Equipment used: Rolling walker (2 wheels) Transfers: Sit to/from Stand Sit to Stand: Mod assist General transfer comment: Mod assist on first STS however able to progress to min assist by the end of session. performed STS 5 x from recliner surface throughout session Ambulation/Gait Ambulation/Gait assistance: Min assist, Mod assist Gait Distance (Feet): 25 Feet Assistive device: Rolling walker (2 wheels) Gait Pattern/deviations: Step-through pattern, Trunk flexed, Narrow base of support, Decreased weight shift to  right, Knee flexed in stance - left General Gait Details: Pt was able to ambulate ~ 25 fy with min-mod assist of one. INcreased time + vcs for posture correction and improved step quality. pt tends to have very Narrow BOS with severe fwd flexed posture. with VCs is able to correct. Gait velocity: decreased   ADL: ADL Overall ADL's : Needs assistance/impaired Grooming: Minimal assistance, Sitting, Wash/dry face Upper Body Dressing : Minimal assistance, Sitting Upper Body Dressing Details (indicate cue type and reason): gown Lower Body Dressing: Maximal assistance, Sit to/from stand, Cueing for sequencing Toilet Transfer: Moderate assistance, Ambulation, Rolling walker (2 wheels) Toilet Transfer Details (indicate cue type and reason): simulated, heavy L lateral lean, Step by step vcs for technique Toileting- Clothing Manipulation and Hygiene: Maximal assistance, Sit to/from stand, Cueing for sequencing Functional mobility during ADLs: Moderate assistance, Rolling walker (2 wheels), Cueing for safety, Cueing for sequencing (approx 10' in room with RW, close chair follow)   Cognition: Cognition Overall Cognitive Status: Impaired/Different from baseline Arousal/Alertness: Awake/alert Orientation Level: Oriented X4 Year: 2023 Month: March Attention: Divided Divided Attention: Impaired Divided Attention Impairment: Verbal basic Memory: Impaired Memory Impairment: Decreased short term memory, Decreased recall of new information Decreased Short Term Memory: Verbal basic (limited recall for education completed for use of call bell following 10 min delay) Awareness: Impaired Problem Solving: Impaired Problem Solving Impairment: Verbal basic Executive Function: Self Monitoring Self Monitoring: Impaired (mod verbal cues for identification and clarification for impact of physical deficits) Self Monitoring Impairment: Verbal basic Safety/Judgment: Impaired (1/4 safety prompts (inc to 3/4 with min  verbal cues)) Cognition Arousal/Alertness: Awake/alert Behavior During Therapy: WFL for tasks assessed/performed Overall Cognitive Status: Impaired/Different from baseline Area of Impairment: Problem solving, Awareness Orientation Level: Disoriented to, Time Current Attention Level: Sustained Following Commands: Follows one step commands with increased time Safety/Judgement: Decreased awareness of deficits, Decreased awareness of safety Awareness: Emergent Problem Solving: Slow processing, Requires verbal cues, Requires tactile cues General Comments: Pt lack full insight of deficits and has overall poor safety awareness. He is however able to follow commands consistently with increased processing time. Is oriented   Blood pressure (!) 175/83, pulse 68, temperature 98.2 F (36.8 C), resp. rate 18, height 6\' 2"  (1.88 m), weight 73.7 kg, SpO2 100 %. Physical Exam Gen: no distress, normal appearing HEENT: oral mucosa pink and moist, NCAT Cardio: Reg rate Chest: normal effort, normal rate of breathing Abd: soft, non-distended Ext: no edema Psych: pleasant, normal affect Skin: intact Neuro: Alert and oriented x3. Follows commands well. Strength 5/5 on right, 4/5 LUE, 3/5 LLE, sensation decreased on left side   Lab Results Last 24 Hours  No results found for this or any previous visit (from the past 24 hour(s)).    Imaging Results (Last 48 hours)  ECHOCARDIOGRAM COMPLETE   Result Date: 11/26/2021    ECHOCARDIOGRAM REPORT   Patient Name:   All[facility]ne GitelmanJAMES H [facility] Date of Exam:  11/26/2021 Medical Rec #:  [id number]     Height:       74.0 in Accession #:    [id number]    Weight:       190.0 lb Date of Birth:  1933-10-31      BSA:          2.127 m Patient Age:    88 years      BP:           150/71 mmHg Patient Gender: M             HR:           75 bpm. Exam Location:  [facility] Procedure: 2D Echo, Color Doppler, Cardiac Doppler and Saline Contrast Bubble            Study Indications:     I63.9 Stroke  History:          Patient has no prior history of Echocardiogram examinations.                  CAD; Risk Factors:Hypertension.  Sonographer:     Humphrey Rolls Referring Phys:  [id number] SARA-MAIZ A THOMAS Diagnosing Phys: Alwyn Pea MD IMPRESSIONS  1. Left ventricular ejection fraction, by estimation, is 70 to 75%. The left ventricle has hyperdynamic function. The left ventricle has no regional wall motion abnormalities. Left ventricular diastolic parameters were normal.  2. Right ventricular systolic function is normal. The right ventricular size is normal.  3. The mitral valve is normal in structure. Trivial mitral valve regurgitation.  4. The aortic valve is normal in structure. Aortic valve regurgitation is not visualized. FINDINGS  Left Ventricle: Left ventricular ejection fraction, by estimation, is 70 to 75%. The left ventricle has hyperdynamic function. The left ventricle has no regional wall motion abnormalities. The left ventricular internal cavity size was normal in size. There is no left ventricular hypertrophy. Left ventricular diastolic parameters were normal. Right Ventricle: The right ventricular size is normal. No increase in right ventricular wall thickness. Right ventricular systolic function is normal. Left Atrium: Left atrial size was normal in size. Right Atrium: Right atrial size was normal in size. Pericardium: There is no evidence of pericardial effusion. Mitral Valve: The mitral valve is normal in structure. Trivial mitral valve regurgitation. Tricuspid Valve: The tricuspid valve is normal in structure. Tricuspid valve regurgitation is trivial. Aortic Valve: The aortic valve is normal in structure. Aortic valve regurgitation is not visualized. Aortic valve mean gradient measures 5.0 mmHg. Aortic valve peak gradient measures 9.5 mmHg. Aortic valve area, by VTI measures 1.96 cm. Pulmonic Valve: The pulmonic valve was normal in structure. Pulmonic valve regurgitation is not visualized. Aorta: The  ascending aorta was not well visualized. IAS/Shunts: No atrial level shunt detected by color flow Doppler. Agitated saline contrast was given intravenously to evaluate for intracardiac shunting.  LEFT VENTRICLE PLAX 2D LVIDd:         4.20 cm   Diastology LVIDs:         2.20 cm   LV e' medial:    8.92 cm/s LV PW:         1.20 cm   LV E/e' medial:  9.0 LV IVS:        1.00 cm   LV e' lateral:   13.50 cm/s LVOT diam:     1.80 cm   LV E/e' lateral: 5.9 LV SV:         60 LV SV Index:   28 LVOT  Area:     2.54 cm  RIGHT VENTRICLE RV Basal diam:  4.20 cm RV S prime:     18.70 cm/s TAPSE (M-mode): 2.8 cm LEFT ATRIUM             Index        RIGHT ATRIUM           Index LA diam:        3.70 cm 1.74 cm/m   RA Area:     16.40 cm LA Vol (A2C):   72.9 ml 34.28 ml/m  RA Volume:   44.30 ml  20.83 ml/m LA Vol (A4C):   85.4 ml 40.15 ml/m LA Biplane Vol: 80.2 ml 37.71 ml/m  AORTIC VALVE                     PULMONIC VALVE AV Area (Vmax):    1.95 cm      PV Vmax:       1.25 m/s AV Area (Vmean):   1.89 cm      PV Peak grad:  6.2 mmHg AV Area (VTI):     1.96 cm AV Vmax:           154.00 cm/s AV Vmean:          105.000 cm/s AV VTI:            0.306 m AV Peak Grad:      9.5 mmHg AV Mean Grad:      5.0 mmHg LVOT Vmax:         118.00 cm/s LVOT Vmean:        78.000 cm/s LVOT VTI:          0.236 m LVOT/AV VTI ratio: 0.77  AORTA Ao Root diam: 3.30 cm MITRAL VALVE               TRICUSPID VALVE MV Area (PHT): 3.19 cm    TR Peak grad:   30.0 mmHg MV Decel Time: 238 msec    TR Vmax:        274.00 cm/s MV E velocity: 80.20 cm/s MV A velocity: 79.70 cm/s  SHUNTS MV E/A ratio:  1.01        Systemic VTI:  0.24 m                            Systemic Diam: 1.80 cm Yolonda Kida MD Electronically signed by Yolonda Kida MD Signature Date/Time: 11/26/2021/5:38:16 PM    Final     EEG adult   Result Date: 11/26/2021 Derek Jack, MD     11/26/2021  3:48 PM Routine EEG Report AZIM GILLINGHAM is a 88 y.o. male with a history of altered  mental status who is undergoing an EEG to evaluate for seizures. Report: This EEG was acquired with electrodes placed according to the International 10-20 electrode system (including Fp1, Fp2, F3, F4, C3, C4, P3, P4, O1, O2, T3, T4, T5, T6, A1, A2, Fz, Cz, Pz). The following electrodes were missing or displaced: none. The occipital dominant rhythm was 9 Hz. This activity is reactive to stimulation. Drowsiness was manifested by background fragmentation; deeper stages of sleep were identified by K complexes and sleep spindles. There was no focal slowing. There were no interictal epileptiform discharges. There were no electrographic seizures identified. Photic stimulation and hyperventilation were not performed. Impression: This EEG was obtained while awake and asleep and is normal.  Clinical Correlation: Normal EEGs, however, do not rule out epilepsy. Bing Neighbors, MD [facility] [phone] If 7pm- 7am, please page neurology on call as listed in AMION.        Assessment/Plan: Diagnosis: Acute right thalamic infarction Does the need for close, 24 hr/day medical supervision in concert with the patient's rehab needs make it unreasonable for this patient to be served in a less intensive setting? Yes Co-Morbidities requiring supervision/potential complications:  HTN: monitor BP TID CAD Anemia: monitor Hgb weekly Hypoalbuminemia: educated regarding high protein diet Low HDL Due to bladder management, bowel management, safety, skin/wound care, disease management, medication administration, pain management, and patient education, does the patient require 24 hr/day rehab nursing? Yes Does the patient require coordinated care of a physician, rehab nurse, therapy disciplines of PT and OT to address physical and functional deficits in the context of the above medical diagnosis(es)? Yes Addressing deficits in the following areas: balance, endurance, locomotion, strength, transferring, bowel/bladder  control, bathing, dressing, feeding, and grooming Can the patient actively participate in an intensive therapy program of at least 3 hrs of therapy per day at least 5 days per week? Yes The potential for patient to make measurable gains while on inpatient rehab is excellent Anticipated functional outcomes upon discharge from inpatient rehab are modified independent  with PT, modified independent with OT, independent with SLP. Estimated rehab length of stay to reach the above functional goals is: 10-14 days Anticipated discharge destination: Home Overall Rehab/Functional Prognosis: excellent   RECOMMENDATIONS: This patient's condition is appropriate for continued rehabilitative care in the following setting: CIR Patient has agreed to participate in recommended program. Yes Note that insurance prior authorization may be required for reimbursement for recommended care.     Horton Chin, MD 11/27/2021          Routing History             Note Details  Author Horton Chin, MD File Time 11/27/2021  7:41 PM  Author Type Physician Status Signed  Last Editor Horton Chin, MD Service Physical Medicine and Rehabilitation  Hospital Acct # [id number] Admit Date 12/02/2021

## 2021-12-03 NOTE — Progress Notes (Signed)
[facility] Individual Statement of Services  Patient Name:  Kevin [name]  Date:  12/03/2021  Welcome to the [facility].  Our goal is to provide you with an individualized program based on your diagnosis and situation, designed to meet your specific needs.  With this comprehensive rehabilitation program, you will be expected to participate in at least 3 hours of rehabilitation therapies Monday-Friday, with modified therapy programming on the weekends.  Your rehabilitation program will include the following services:  Physical Therapy (PT), Occupational Therapy (OT), Speech Therapy (ST), 24 hour per day rehabilitation nursing, Therapeutic Recreaction (TR), Neuropsychology, Care Coordinator, Rehabilitation Medicine, Nutrition Services, Pharmacy Services, and Other  Weekly team conferences will be held on Wednesdays to discuss your progress.  Your Inpatient Rehabilitation Care Coordinator will talk with you frequently to get your input and to update you on team discussions.  Team conferences with you and your family in attendance may also be held.  Expected length of stay: 10-14 Days  Overall anticipated outcome:  Supervision to Min A  Depending on your progress and recovery, your program may change. Your Inpatient Rehabilitation Care Coordinator will coordinate services and will keep you informed of any changes. Your Inpatient Rehabilitation Care Coordinator's name and contact numbers are listed  below.  The following services may also be recommended but are not provided by the [facility]:   [place] will be made to provide these services after discharge if needed.  Arrangements include referral to agencies that provide these services.  Your insurance has been verified to be:  [employer]  Your primary doctor is:  Juliane Poot, MD  Pertinent  information will be shared with your doctor and your insurance company.  Inpatient Rehabilitation Care Coordinator:  Erlene Quan, [place] or [phone]  Information discussed with and copy given to patient by: Dyanne Iha, 12/03/2021, 10:28 AM

## 2021-12-03 NOTE — Evaluation (Signed)
Occupational Therapy Assessment and Plan  Patient Details  Name: Kevin [name] MRN: [id number] Date of Birth: [birth year]  OT Diagnosis: abnormal posture, cognitive deficits, disturbance of vision, and muscle weakness (generalized) Rehab Potential: Rehab Potential (ACUTE ONLY): Excellent ELOS: 7-10 days   Today's Date: 12/03/2021 OT Individual Time: 0930-1030 OT Individual Time Calculation (min): 60 min     Hospital Problem: Principal Problem:   Right thalamic infarction [facility])   Past Medical History:  Past Medical History:  Diagnosis Date   Anemia    Colon polyps    Coronary artery disease    Hematuria    History of hiatal hernia    Hypertension    Past Surgical History:  Past Surgical History:  Procedure Laterality Date   COLONOSCOPY WITH ESOPHAGOGASTRODUODENOSCOPY (EGD)     COLONOSCOPY WITH PROPOFOL N/A 09/27/2014   Procedure: COLONOSCOPY WITH PROPOFOL;  Surgeon: Manya Silvas, MD;  Location: [place];  Service: Endoscopy;  Laterality: N/A;   JOINT REPLACEMENT      Assessment & Plan Clinical Impression:  Kevin [name] is an 88 year old right-handed male with history of hypertension, CAD/nonobstructive, chronic anemia as well as recent oral surgery with full mouth teeth extraction.  Per chart review patient lives with adult aged grandchildren.  1 level home.  Independent with occasional cane.  Independent ADLs/drives and does lawn care.  Presented to [facility]/25/2021 with acute onset of left-sided weakness/transient confusion resulting a fall.  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  Patient did not receive tPA.  CT angiogram head and neck no emergent large vessel occlusion.  MRI cervical thoracic lumbar spine showed no acute changes. Patient transferred to CIR on 12/02/2021 .    Patient currently requires  SBA-Mod A  with basic self-care skills secondary to muscle weakness, motor apraxia and decreased coordination, decreased awareness, decreased safety  awareness, and decreased memory, and decreased sitting balance, decreased standing balance, decreased balance strategies, and visual deficits .  Prior to hospitalization, patient could complete all ADL tasks with independent .  Patient will benefit from skilled intervention to increase independence with basic self-care skills prior to discharge home with care partner.  Anticipate patient will require intermittent supervision and no further OT follow recommended.  OT - End of Session Activity Tolerance: Decreased this session Endurance Deficit: Yes Endurance Deficit Description: decreased OT Assessment Rehab Potential (ACUTE ONLY): Excellent OT Patient demonstrates impairments in the following area(s): Balance;Cognition;Vision;Endurance;Motor;Safety;Perception OT Basic ADL's Functional Problem(s): Toileting;Dressing;Bathing;Grooming OT Advanced ADL's Functional Problem(s): Simple Meal Preparation;Light Housekeeping OT Transfers Functional Problem(s): Toilet;Tub/Shower OT Plan OT Intensity: Minimum of 1-2 x/day, 45 to 90 minutes OT Frequency: 5 out of 7 days OT Duration/Estimated Length of Stay: 7-10 days OT Treatment/Interventions: Balance/vestibular training;Neuromuscular re-education;Self Care/advanced ADL retraining;Therapeutic Exercise;Wheelchair propulsion/positioning;DME/adaptive equipment instruction;Pain management;Cognitive remediation/compensation;UE/LE Strength taining/ROM;UE/LE Coordination activities;Splinting/orthotics;Patient/family education;Functional electrical stimulation;Community reintegration;Functional mobility training;Discharge planning;Therapeutic Activities;Visual/perceptual remediation/compensation OT Basic Self-Care Anticipated Outcome(s): Mod I OT Toileting Anticipated Outcome(s): Mod I OT Bathroom Transfers Anticipated Outcome(s): Mod I OT Recommendation Recommendations for Other Services: Speech consult;Therapeutic Recreation consult Therapeutic Recreation  Interventions: Outing/community reintergration;Kitchen group Patient destination: Home Follow Up Recommendations: None Equipment Recommended: Tub/shower seat;3 in 1 bedside comode Equipment Details: Has a SPC and RW.   OT Evaluation Precautions/Restrictions  Precautions Precautions: Fall Restrictions Weight Bearing Restrictions: No  Pain Pain Assessment Pain Scale: 0-10 Pain Score: 0-No pain Home Living/Prior Functioning Home Living Family/patient expects to be discharged to:: Private residence Living Arrangements: Children, Other relatives Available Help at Discharge: Family, Available 24 hours/day Type  of Home: House Home Access: Stairs to enter, Level entry Entrance Stairs-Number of Steps: 2-3 steps into house front. Side door has no steps enter. Back door has 2 steps to enter. Pt uses side door to enter home. Home Layout: One level Bathroom Shower/Tub: Gaffer, Art therapist bedroom) Bathroom Toilet: Standard Bathroom Accessibility: Yes Additional Comments: Would carry a cane with him, but did not actually use it. Was more for a sense of security/just in case.  Lives With: Family (daughter and son's live with him) IADL History Current License: Yes IADL Comments: has riding lawnmower and mows his 2 acres of land. 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 Pt reports that letters are blending together although they are clear when reading menu) Patient Visual Report: No change from baseline Vision Assessment?: Yes Eye Alignment: Within Functional Limits Ocular Range of Motion: Within Functional Limits Alignment/Gaze Preference: Within Defined  Limits Tracking/Visual Pursuits: Decreased smoothness of eye movement to LEFT superior field;Decreased smoothness of eye movement to LEFT inferior field Saccades: Within functional limits Convergence: Within functional limits Visual Fields: No apparent deficits Perception  Perception: Within Functional Limits Praxis Praxis: Impaired Praxis Impairment Details: Motor planning (very mild impairment noted) Cognition Cognition Overall Cognitive Status: No family/caregiver present to determine baseline cognitive functioning Arousal/Alertness: Awake/alert Memory: Impaired Memory Impairment: Decreased short term memory;Decreased recall of new information Safety/Judgment: Impaired Brief Interview for Mental Status (BIMS) Repetition of Three Words (First Attempt): 3 Temporal Orientation: Year: Correct Temporal Orientation: Month: Accurate within 5 days Temporal Orientation: Day: Incorrect Recall: "Sock": No, could not recall Recall: "Blue": No, could not recall Recall: "Bed": No, could not recall BIMS Summary Score: 8 Sensation Sensation Light Touch: Appears Intact Hot/Cold: Appears Intact Proprioception: Appears Intact Stereognosis: Appears Intact Additional Comments: Pt reports chronic mild tingling in the right hand; intermittent Coordination Gross Motor Movements are Fluid and Coordinated: No Fine Motor Movements are Fluid and Coordinated: No Motor  Motor Motor: Motor apraxia  Trunk/Postural Assessment  Cervical Assessment Cervical Assessment: Exceptions to [facility] (forward head) Thoracic Assessment Thoracic Assessment: Within Functional Limits Lumbar Assessment Lumbar Assessment: Exceptions to [facility] (posterior pelvic tilt) Postural Control Postural Control: Deficits on evaluation (delayed and impaired)  Balance Balance Balance Assessed: Yes Static Sitting Balance Static Sitting - Balance Support: No upper extremity supported;Feet supported Static Sitting - Level of  Assistance: 5: Stand by assistance Dynamic Sitting Balance Dynamic Sitting - Balance Support: Feet supported;During functional activity Dynamic Sitting - Level of Assistance: 5: Stand by assistance Dynamic Sitting - Balance Activities: Forward lean/weight shifting;Reaching across midline;Trunk control activities;Reaching for objects Static Standing Balance Static Standing - Balance Support: Left upper extremity supported;Right upper extremity supported;During functional activity Static Standing - Level of Assistance: 4: Min assist Dynamic Standing Balance Dynamic Standing - Balance Support: Right upper extremity supported;Left upper extremity supported;During functional activity Dynamic Standing - Level of Assistance: 4: Min assist Extremity/Trunk Assessment RUE Assessment RUE Assessment: Exceptions to [facility] Active Range of Motion (AROM) Comments: Significant shoulder clunking noted during shoulder flexion and abduction; suspect degeneration of AC joint. A/ROM [facility] for shoulder, elbow, wrist ranges. General Strength Comments: Shoulder flexion/abduction/IR/er: 5/5, Elbow flexion/extension: 5/5, gross grasp functional LUE Assessment LUE Assessment: Within Functional Limits Active Range of Motion (AROM) Comments: A/ROM [facility]  in all ranges. General Strength Comments: Strength: 5/5 in shoulder, elbow, and wrist ranges.  Care Tool Care Tool Self Care Eating   Eating Assist Level: Set up assist    Oral Care    Oral Care Assist Level: Supervision/Verbal cueing    Bathing   Body parts bathed by patient: Right arm;Left arm;Chest;Front perineal area;Buttocks;Abdomen;Right upper leg;Left upper leg;Face Body parts bathed by helper: Left lower leg;Right lower leg   Assist Level: Minimal Assistance - Patient > 75%    Upper Body Dressing(including orthotics)   What is the patient wearing?: Pull over shirt   Assist Level: Set up assist    Lower Body Dressing (excluding footwear)   What is the  patient wearing?: Underwear/pull up;Pants Assist for lower body dressing: Supervision/Verbal cueing    Putting on/Taking off footwear   What is the patient wearing?: Ted hose;Shoes Assist for footwear: Dependent - Patient 0%       Care Tool Toileting Toileting activity   Assist for toileting: Minimal Assistance - Patient > 75%     Care Tool Bed Mobility       Lying to sitting on side of bed activity   Lying to sitting on side of bed assist level: the ability to move from lying on the back to sitting on the side of the bed with no back support.: Supervision/Verbal cueing     Care Tool Transfers Sit to stand transfer   Sit to stand assist level: Moderate Assistance - Patient 50 - 74%    Chair/bed transfer   Chair/bed transfer assist level: Minimal Assistance - Patient > 75%     Toilet transfer   Assist Level: Minimal Assistance - Patient > 75%     Care Tool Cognition  Expression of Ideas and Wants Expression of Ideas and Wants: 4. Without difficulty (complex and basic) - expresses complex messages without difficulty and with speech that is clear and easy to understand  Understanding Verbal and Non-Verbal Content Understanding Verbal and Non-Verbal Content: 3. Usually understands - understands most conversations, but misses some part/intent of message. Requires cues at times to understand   Memory/Recall Ability Memory/Recall Ability : That he or she is in a hospital/hospital unit   Refer to Care Plan for Long Term Goals  SHORT TERM GOAL WEEK 1 OT Short Term Goal 1 (Week 1): Pt will increase sit to stand from standard height seat to Min A with use of RW while receiving 1 cue if needed for technique.  Recommendations for other services: Therapeutic Recreation  Kitchen group and Outing/community reintegration   Skilled Therapeutic Intervention  Patient in bed upon therapy arrival and agreeable to participate in OT evaluation. Patient demonstrates mild motor ataxia, visual  impairments, cognitive deficits, decreased balance and awareness resulting in difficulty completing BADL tasks without increased physical assist. Pt will benefit from Skilled OT services to focus on mentioned deficits. See below for ADL performance. Education provided regarding therapy schedule, goals for therapy, and safety policy while in rehab. Pt verbalized understanding although due to short term memory issues will need to be address again.   ADL ADL Eating: Set up Where Assessed-Eating: Bed level Grooming: Supervision/safety Where Assessed-Grooming: Sitting at sink Upper Body Bathing: Setup Where Assessed-Upper Body Bathing: Sitting at sink Lower Body Bathing: Minimal assistance Where Assessed-Lower Body Bathing: Sitting at sink Upper Body Dressing: Setup Where Assessed-Upper Body Dressing: Edge of bed Lower Body Dressing: Minimal assistance Where Assessed-Lower Body Dressing: Edge of bed Toileting: Minimal assistance Where Assessed-Toileting: Toilet;Bedside Commode  Toilet Transfer: Minimal Print production planner Method: Ambulating Tub/Shower Transfer: Not assessed Social research officer, government: Not assessed Mobility  Bed Mobility Bed Mobility: Supine to Sit;Sitting - Scoot to Edge of Bed Supine to Sit: Supervision/Verbal cueing Sitting - Scoot to Edge of Bed: Supervision/Verbal cueing Transfers Sit to Stand: Moderate Assistance - Patient 50-74% Stand to Sit: Moderate Assistance - Patient 50-74%   Discharge Criteria: Patient will be discharged from OT if patient refuses treatment 3 consecutive times without medical reason, if treatment goals not met, if there is a change in medical status, if patient makes no progress towards goals or if patient is discharged from hospital.  The above assessment, treatment plan, treatment alternatives and goals were discussed and mutually agreed upon: by patient  Ailene Ravel, OTR/L,CBIS  Supplemental OT - [place] [facility]  12/03/2021, 12:51  PM

## 2021-12-03 NOTE — Progress Notes (Signed)
Inpatient Rehabilitation  Patient information reviewed and entered into eRehab system by Iver Fehrenbach Ahria Slappey, OTR/L, Rehab Quality Coordinator.   Information including medical coding, functional ability and quality indicators will be reviewed and updated through discharge.   

## 2021-12-03 NOTE — Plan of Care (Signed)
  Problem: RH Balance Goal: LTG Patient will maintain dynamic sitting balance (PT) Description: LTG:  Patient will maintain dynamic sitting balance with assistance during mobility activities (PT) Flowsheets (Taken 12/03/2021 1821) LTG: Pt will maintain dynamic sitting balance during mobility activities with:: Independent with assistive device  Goal: LTG Patient will maintain dynamic standing balance (PT) Description: LTG:  Patient will maintain dynamic standing balance with assistance during mobility activities (PT) Flowsheets (Taken 12/03/2021 1821) LTG: Pt will maintain dynamic standing balance during mobility activities with:: Supervision/Verbal cueing   Problem: Sit to Stand Goal: LTG:  Patient will perform sit to stand with assistance level (PT) Description: LTG:  Patient will perform sit to stand with assistance level (PT) Flowsheets (Taken 12/03/2021 1821) LTG: PT will perform sit to stand in preparation for functional mobility with assistance level: Supervision/Verbal cueing Note: With LRAD    Problem: RH Bed Mobility Goal: LTG Patient will perform bed mobility with assist (PT) Description: LTG: Patient will perform bed mobility with assistance, with/without cues (PT). Flowsheets (Taken 12/03/2021 1821) LTG: Pt will perform bed mobility with assistance level of: Independent with assistive device    Problem: RH Bed to Chair Transfers Goal: LTG Patient will perform bed/chair transfers w/assist (PT) Description: LTG: Patient will perform bed to chair transfers with assistance (PT). Flowsheets (Taken 12/03/2021 1821) LTG: Pt will perform Bed to Chair Transfers with assistance level: Supervision/Verbal cueing Note: With LRAD   Problem: RH Car Transfers Goal: LTG Patient will perform car transfers with assist (PT) Description: LTG: Patient will perform car transfers with assistance (PT). Flowsheets (Taken 12/03/2021 1821) LTG: Pt will perform car transfers with assist::  Supervision/Verbal cueing Note: With LRAD   Problem: RH Ambulation Goal: LTG Patient will ambulate in controlled environment (PT) Description: LTG: Patient will ambulate in a controlled environment, # of feet with assistance (PT). Flowsheets (Taken 12/03/2021 1821) LTG: Pt will ambulate in controlled environ  assist needed:: Supervision/Verbal cueing LTG: Ambulation distance in controlled environment: 158ft with LRAD Goal: LTG Patient will ambulate in home environment (PT) Description: LTG: Patient will ambulate in home environment, # of feet with assistance (PT). Flowsheets (Taken 12/03/2021 1827) LTG: Pt will ambulate in home environ  assist needed:: Supervision/Verbal cueing LTG: Ambulation distance in home environment: 53ft with LRAD

## 2021-12-03 NOTE — Evaluation (Signed)
Physical Therapy Assessment and Plan  Patient Details  Name: Kevin [name] MRN: [id number] Date of Birth: 1933/05/16  PT Diagnosis: Abnormal posture, Abnormality of gait, Cognitive deficits, Difficulty walking, and Hemiparesis non-dominant Rehab Potential: Good ELOS: 7-10days   Today's Date: 12/03/2021 PT Individual Time: 1105-1207 PT Individual Time Calculation (min): 32 min    Hospital Problem: Principal Problem:   Right thalamic infarction [facility])   Past Medical History:  Past Medical History:  Diagnosis Date   Anemia    Colon polyps    Coronary artery disease    Hematuria    History of hiatal hernia    Hypertension    Past Surgical History:  Past Surgical History:  Procedure Laterality Date   COLONOSCOPY WITH ESOPHAGOGASTRODUODENOSCOPY (EGD)     COLONOSCOPY WITH PROPOFOL N/A 09/27/2014   Procedure: COLONOSCOPY WITH PROPOFOL;  Surgeon: Manya Silvas, MD;  Location: [facility];  Service: Endoscopy;  Laterality: N/A;   JOINT REPLACEMENT      Assessment & Plan Clinical Impression: Kevin [name] is an 88 year old right-handed male with history of hypertension, CAD/nonobstructive, chronic anemia as well as recent oral surgery with full mouth teeth extraction.  Per chart review patient lives with adult aged grandchildren.  1 level home.  Independent with occasional cane.  Independent ADLs/drives and does lawn care.  Presented to [facility] 11/25/2021 with acute onset of left-sided weakness/transient confusion resulting a fall.  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  Patient did not receive tPA.  CT angiogram head and neck no emergent large vessel occlusion.  MRI cervical thoracic lumbar spine showed no acute changes.  No significant stenosis.  EEG negative for seizure.  Echocardiogram ejection fraction of 70 to 75% no wall motion abnormalities.  Admission chemistries unremarkable, hemoglobin 12.1, hemoglobin A1c 5.7, urine drug screen negative.  Neurology follow-up  currently maintained on low-dose aspirin 81 mg daily and Plavix 75 mg daily for CVA prophylaxis x3 weeks then aspirin alone.  Subcutaneous heparin for DVT prophylaxis.  Tolerating a regular diet.  Reports he has not had a a recent BM but he was documented as having a large BM yesterday. He denies any pain. Reports his strength is gradually improving. Therapy evaluations completed due to patient left-sided weakness decreased functional mobility was admitted for a comprehensive rehab program..  Patient transferred to CIR on 12/02/2021 .   Patient currently requires mod with mobility secondary to muscle weakness, decreased cardiorespiratoy endurance, impaired timing and sequencing, unbalanced muscle activation, and decreased motor planning,  ,  , decreased awareness, decreased memory, and delayed processing,  , and decreased sitting balance, decreased standing balance, decreased postural control, and decreased balance strategies.  Prior to hospitalization, patient was modified independent  with mobility and lived with Family (Adult children and adult grandchildren live with pt) in a House home.  Home access is  Level entry.  Patient will benefit from skilled PT intervention to maximize safe functional mobility, minimize fall risk, and decrease caregiver burden for planned discharge home with 24 hour supervision.  Anticipate patient will benefit from follow up OP at discharge.  PT - End of Session Activity Tolerance: Tolerates 30+ min activity with multiple rests Endurance Deficit: Yes Endurance Deficit Description: decreased PT Assessment Rehab Potential (ACUTE/IP ONLY): Good PT Patient demonstrates impairments in the following area(s): Balance;Behavior;Endurance;Motor;Pain;Perception;Safety;Sensory;Skin Integrity;Edema PT Transfers Functional Problem(s): Bed Mobility;Bed to Chair;Car PT Locomotion Functional Problem(s): Ambulation PT Plan PT Intensity: Minimum of 1-2 x/day ,45 to 90 minutes PT  Frequency: 5 out of 7 days  PT Duration Estimated Length of Stay: 7-10days PT Treatment/Interventions: Ambulation/gait training;Cognitive remediation/compensation;Discharge planning;DME/adaptive equipment instruction;Functional mobility training;Pain management;Psychosocial support;Splinting/orthotics;Therapeutic Activities;UE/LE Strength taining/ROM;Visual/perceptual remediation/compensation;Balance/vestibular training;Community reintegration;Disease management/prevention;Functional electrical stimulation;Neuromuscular re-education;Patient/family education;Skin care/wound management;Stair training;Therapeutic Exercise;UE/LE Coordination activities;Wheelchair propulsion/positioning PT Transfers Anticipated Outcome(s): supervision with LRAD PT Locomotion Anticipated Outcome(s): supervision with LRAD PT Recommendation Recommendations for Other Services: Therapeutic Recreation consult Therapeutic Recreation Interventions: Outing/community reintergration Follow Up Recommendations: Outpatient PT;24 hour supervision/assistance Patient destination: Home Equipment Recommended: To be determined   PT Evaluation Precautions/Restrictions Precautions Precautions: Fall Precaution Comments: L hemiparesis Restrictions Weight Bearing Restrictions: No  Pain Pain Assessment Pain Scale: 0-10 Pain Score: 0-No pain Pain Interference Pain Interference Pain Effect on Sleep: 1. Rarely or not at all Pain Interference with Therapy Activities: 1. Rarely or not at all Pain Interference with Day-to-Day Activities: 1. Rarely or not at all [place]: Children;Other relatives Available Help at Discharge: Family;Available 24 hours/day Type of Home: House Home Access: Level entry Entrance Stairs-Number of Steps: 2-3 steps into house front. Side door has no steps enter. Back door has 2 steps to enter. Pt uses side door to enter home. Home Layout: One  level Bathroom Shower/Tub: Event organiser Chiropractor bedroom) Bathroom Toilet: Standard Bathroom Accessibility: Yes Additional Comments: Would carry a cane with him, but did not actually use it. Was more for a sense of security/just in case.  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  Cognition Overall Cognitive Status: Impaired/Different from baseline Arousal/Alertness: Awake/alert Orientation Level: Oriented to person;Oriented to place;Oriented to situation;Disoriented to time Year: 2023 Month: December Day of Week: Incorrect Attention: Focused;Sustained Focused Attention: Appears intact Sustained Attention: Appears intact Memory: Impaired Memory Impairment: Decreased short term memory;Decreased recall of new information Awareness: Appears intact Problem Solving: Impaired (transfers) Safety/Judgment: Impaired Sensation Sensation Light Touch: Impaired by gross assessment Hot/Cold: Not tested Proprioception: Not tested Stereognosis: Not tested Additional Comments: Decreased sensation throughout L LE compared to R LE. Coordination Gross Motor Movements are Fluid and Coordinated: No Fine Motor Movements are Fluid and Coordinated: No Coordination and  Movement Description: Decreased L LE coordination due to L hemi Motor  Motor Motor: Other (comment) (hemiparesis on the L) Motor - Skilled Clinical Observations: Difficulty with moving and placing L foot when stepping during walking and with stand pivot transfers.   Trunk/Postural Assessment  Cervical Assessment Cervical Assessment: Exceptions to [facility] (forward head) Thoracic Assessment Thoracic Assessment: Within Functional Limits Lumbar Assessment Lumbar Assessment: Exceptions to [facility] (posterio pelvic tilt) Postural Control Postural Control: Deficits on evaluation (delayed and impaired)  Balance Balance Balance Assessed: Yes Static Sitting Balance Static Sitting - Balance Support: No upper extremity supported;Feet supported Static Sitting - Level of Assistance: 5: Stand by assistance Dynamic Sitting Balance Dynamic Sitting - Balance Support: Feet supported;During functional activity Dynamic Sitting - Level of Assistance: 5: Stand by assistance Dynamic Sitting - Balance Activities: Forward lean/weight shifting;Reaching across midline;Trunk control activities;Reaching for objects Static Standing Balance Static Standing - Balance Support: Left  upper extremity supported;During functional activity Static Standing - Level of Assistance: 4: Min assist Dynamic Standing Balance Dynamic Standing - Balance Support: Left upper extremity supported Dynamic Standing - Level of Assistance: 3: Mod assist Extremity Assessment      RLE Assessment RLE Assessment: Within Functional Limits Passive Range of Motion (PROM) Comments: Decreased R ankle DF LLE Assessment LLE Assessment: Exceptions to [facility] LLE Strength Left Hip Flexion: 4-/5 Left Knee Flexion: 4-/5 Left Knee Extension: 4/5 Left Ankle Dorsiflexion: 2+/5 (limited ROM) Left Ankle Plantar Flexion: 4-/5  Care Tool Care Tool Bed Mobility Roll left and right activity   Roll left and right assist level: Supervision/Verbal cueing    Sit  to lying activity   Sit to lying assist level: Supervision/Verbal cueing    Lying to sitting on side of bed activity   Lying to sitting on side of bed assist level: the ability to move from lying on the back to sitting on the side of the bed with no back support.: Supervision/Verbal cueing     Care Tool Transfers Sit to stand transfer   Sit to stand assist level: Moderate Assistance - Patient 50 - 74%    Chair/bed transfer   Chair/bed transfer assist level: Moderate Assistance - Patient 50 - 74%     Product manager transfer assist level: Moderate Assistance - Patient 50 - 74%      Care Tool Locomotion Ambulation   Assist level: Moderate Assistance - Patient 50 - 74% Assistive device: Hand held assist Max distance: 46 ft  Walk 10 feet activity   Assist level: Moderate Assistance - Patient - 50 - 74% Assistive device: Hand held assist   Walk 50 feet with 2 turns activity Walk 50 feet with 2 turns activity did not occur: Safety/medical concerns      Walk 150 feet activity Walk 150 feet activity did not occur: Safety/medical concerns      Walk 10 feet on uneven surfaces activity Walk 10 feet on uneven surfaces activity did not occur: Safety/medical concerns      Stairs Stair activity did not occur: Safety/medical concerns (Pt will not being using a home entrance with stairs)        Walk up/down 1 step activity Walk up/down 1 step or curb (drop down) activity did not occur: Safety/medical concerns      Walk up/down 4 steps activity Walk up/down 4 steps activity did not occur: Safety/medical concerns      Walk up/down 12 steps activity Walk up/down 12 steps activity did not occur: Safety/medical concerns      Pick up small objects from floor   Pick up small object from the floor assist level: Moderate Assistance - Patient 50 - 74% Pick up small object from the floor assistive device: L HHA  Wheelchair Is the patient using a wheelchair?: Yes  (transported pt to and from gym for time management) Type of Wheelchair: Manual   Wheelchair assist level: Dependent - Patient 0% Max wheelchair distance: 115ft  Wheel 50 feet with 2 turns activity   Assist Level: Dependent - Patient 0%  Wheel 150 feet activity   Assist Level: Dependent - Patient 0%    Refer to Care Plan for Long Term Goals  SHORT TERM GOAL WEEK 1 PT Short Term Goal 1 (Week 1): = to LTG's due to ELOS  Recommendations for other services: Therapeutic Recreation  Outing/community reintegration  Skilled Therapeutic Intervention  Pt greeted  seated in recliner and agreeable to therapy.  Evaluation completed (see details above) with patient education regarding purpose of PT evaluation, PT POC and goals, therapy schedule, weekly team meetings, and other CIR information including safety plan and fall risk safety. Pt performed the below functional mobility tasks with the specified levels of skilled cuing and assistance. Pt presents with decreased strength and sensation in the L LE making it difficult for pt to place L foot correctly during transfers and occasionally doesn't fully  swing through during gait.   Pt left seated in WC with posey belt on, call bell in reach, and all needs met.    Mobility Bed Mobility Bed Mobility: Supine to Sit;Sit to Supine Supine to Sit: Supervision/Verbal cueing Sitting - Scoot to Edge of Bed: Supervision/Verbal cueing Sit to Supine: Supervision/Verbal cueing Transfers Transfers: Sit to Stand;Stand to Sit;Stand Pivot Transfers Sit to Stand: Moderate Assistance - Patient 50-74% Stand to Sit: Moderate Assistance - Patient 50-74% Stand Pivot Transfers: Moderate Assistance - Patient 50 - 74% Stand Pivot Transfer Details: Verbal cues for sequencing;Verbal cues for technique;Verbal cues for precautions/safety Transfer (Assistive device): 1 person hand held assist (On L side) Locomotion  Gait Ambulation: Yes Gait Assistance: Moderate  Assistance - Patient 50-74% Gait Distance (Feet): 46 Feet Assistive device: 1 person hand held assist (L HHA) Gait Assistance Details: Verbal cues for sequencing;Verbal cues for technique;Verbal cues for precautions/safety;Verbal cues for gait pattern Gait Assistance Details: for balance and steadiness Gait Gait: Yes Gait Pattern: Impaired Gait Pattern: Decreased step length - right;Decreased step length - left;Decreased stride length;Decreased hip/knee flexion - right;Decreased hip/knee flexion - left;Decreased dorsiflexion - right;Decreased dorsiflexion - left;Lateral hip instability;Poor foot clearance - left Gait velocity: decreased Stairs / Additional Locomotion Stairs: No (Will not be using stairs to enter home) Wheelchair Mobility Wheelchair Mobility: No   Discharge Criteria: Patient will be discharged from PT if patient refuses treatment 3 consecutive times without medical reason, if treatment goals not met, if there is a change in medical status, if patient makes no progress towards goals or if patient is discharged from hospital.  The above assessment, treatment plan, treatment alternatives and goals were discussed and mutually agreed upon: by patient  Kayleen Memos 12/03/2021, 7:34 AM

## 2021-12-03 NOTE — Progress Notes (Signed)
PROGRESS NOTE   Subjective/Complaints: Pt seen at bedside. Reports therapy is going well. No new complaints.    Review of Systems  Constitutional:  Negative for chills and fever.  HENT:  Negative for congestion.   Eyes:  Negative for double vision.  Respiratory:  Negative for cough and shortness of breath.   Cardiovascular:  Negative for chest pain.  Gastrointestinal: Negative.   Genitourinary: Negative.   Musculoskeletal: Negative.   Neurological:  Positive for focal weakness.    Objective:   No results found. Recent Labs    12/02/21 1519 12/03/21 0536  WBC 4.9 5.7  HGB 12.2* 10.9*  HCT 37.9* 33.5*  PLT 329 311   Recent Labs    12/02/21 1519 12/03/21 0536  NA  --  139  K  --  4.4  CL  --  107  CO2  --  25  GLUCOSE  --  103*  BUN  --  30*  CREATININE 0.91 1.22  CALCIUM  --  9.0    Intake/Output Summary (Last 24 hours) at 12/03/2021 1453 Last data filed at 12/03/2021 1300 Gross per 24 hour  Intake 533 ml  Output 150 ml  Net 383 ml        Physical Exam: Vital Signs Blood pressure 126/68, pulse 68, temperature 97.8 F (36.6 C), resp. rate 15, height 6\' 2"  (1.88 m), weight 72.5 kg, SpO2 99 %.    Assessment/Plan: 1. Functional deficits which require 3+ hours per day of interdisciplinary therapy in a comprehensive inpatient rehab setting. Physiatrist is providing close team supervision and 24 hour management of active medical problems listed below. Physiatrist and rehab team continue to assess barriers to discharge/monitor patient progress toward functional and medical goals  Care Tool:  Bathing    Body parts bathed by patient: Right arm, Left arm, Chest, Front perineal area, Buttocks, Abdomen, Right upper leg, Left upper leg, Face   Body parts bathed by helper: Left lower leg, Right lower leg     Bathing assist Assist Level: Minimal Assistance - Patient > 75%     Upper Body  Dressing/Undressing Upper body dressing   What is the patient wearing?: Pull over shirt    Upper body assist Assist Level: Set up assist    Lower Body Dressing/Undressing Lower body dressing      What is the patient wearing?: Underwear/pull up, Pants     Lower body assist Assist for lower body dressing: Supervision/Verbal cueing     Toileting Toileting    Toileting assist Assist for toileting: Minimal Assistance - Patient > 75%     Transfers Chair/bed transfer  Transfers assist     Chair/bed transfer assist level: Minimal Assistance - Patient > 75%     Locomotion Ambulation   Ambulation assist      Assist level: Moderate Assistance - Patient 50 - 74% Assistive device: Hand held assist Max distance: 46 ft   Walk 10 feet activity   Assist     Assist level: Moderate Assistance - Patient - 50 - 74% Assistive device: Hand held assist   Walk 50 feet activity   Assist Walk 50 feet with 2 turns activity did not occur:  Safety/medical concerns         Walk 150 feet activity   Assist Walk 150 feet activity did not occur: Safety/medical concerns         Walk 10 feet on uneven surface  activity   Assist Walk 10 feet on uneven surfaces activity did not occur: Safety/medical concerns         Wheelchair     Assist Is the patient using a wheelchair?: Yes (transported pt to and from gym for time management) Type of Wheelchair: Manual    Wheelchair assist level: Dependent - Patient 0% Max wheelchair distance: 116ft    Wheelchair 50 feet with 2 turns activity    Assist        Assist Level: Dependent - Patient 0%   Wheelchair 150 feet activity     Assist      Assist Level: Dependent - Patient 0%   Blood pressure 126/68, pulse 68, temperature 97.8 F (36.6 C), resp. rate 15, height 6\' 2"  (1.88 m), weight 72.5 kg, SpO2 99 %.  Medical Problem List and Plan: 1. Functional deficits secondary to right ischemic thalamic  infarction             -patient may shower             -ELOS/Goals: 7-10 days, Min A with PT/OT, sup with SLP             -Continue CIR 2.  Antithrombotics: -DVT/anticoagulation:  Pharmaceutical: Heparin             -antiplatelet therapy: Aspirin 81 mg daily and Plavix 75 mg daily x3 weeks then aspirin alone 3. Pain Management: Tylenol as needed 4. Mood/Behavior/Sleep: Provide emotional support             -antipsychotic agents: N/A 5. Neuropsych/cognition: This patient is capable of making decisions on his own behalf. 6. Skin/Wound Care: Routine skin checks 7. Fluids/Electrolytes/Nutrition: Routine in and outs with follow-up chemistries 8.  Hypertension.  Norvasc 10 mg daily, hydralazine 25 mg every 8 hours.  Monitor with increased mobility. Avoid hypotension. Well controlled, continue to monitor 9.  Hyperlipidemia.  Lipitor. Heart healthy diet 10.  CAD/nonobstructive.  Continue aspirin Plavix.  No complaints of chest pain 11.  Constipation.  MiraLAX twice daily, Senokot nightly.             -LBM 9/3 large, monitor 12. Poor dentition             -Dys 3 diet 13. Anemia- mild             -HGB down a little to 10.9, continue to monitor  14. Azotemia  -BUN up to 30, encourage oral fluids     LOS: 1 days A FACE TO FACE EVALUATION WAS PERFORMED  11/3 12/03/2021, 2:53 PM

## 2021-12-03 NOTE — Plan of Care (Signed)
  Problem: Consults Goal: RH STROKE PATIENT EDUCATION Description: See Patient Education module for education specifics  Outcome: Progressing   Problem: RH BOWEL ELIMINATION Goal: RH STG MANAGE BOWEL WITH ASSISTANCE Description: STG Manage Bowel with mod I  Assistance. Outcome: Progressing Goal: RH STG MANAGE BOWEL W/MEDICATION W/ASSISTANCE Description: STG Manage Bowel with Medication with  mod I Assistance. Outcome: Progressing   Problem: RH SAFETY Goal: RH STG ADHERE TO SAFETY PRECAUTIONS W/ASSISTANCE/DEVICE Description: STG Adhere to Safety Precautions With cues Assistance/Device. Outcome: Progressing   Problem: RH KNOWLEDGE DEFICIT Goal: RH STG INCREASE KNOWLEDGE OF DIABETES Description: Patient and daughter will be able to manage prediabetes with  dietary modifications using handouts and educational resources independently Outcome: Progressing Goal: RH STG INCREASE KNOWLEDGE OF HYPERTENSION Description: Patient and daughter will be able to manage HTN with medications and dietary modifications using handouts and educational resources independently Outcome: Progressing Goal: RH STG INCREASE KNOWLEGDE OF HYPERLIPIDEMIA Description: Patient and daughter will be able to manage HLD with medications and dietary modifications using handouts and educational resources independently Outcome: Progressing Goal: RH STG INCREASE KNOWLEDGE OF STROKE PROPHYLAXIS Description: Patient and daughter will be able to manage secondary stroke risks with medications and dietary modifications using handouts and educational resources independently Outcome: Progressing

## 2021-12-03 NOTE — Progress Notes (Signed)
Occupational Therapy Session Note  Patient Details  Name: Kevin [name] MRN: [id number] Date of Birth: 1933-10-17  Today's Date: 12/03/2021 OT Individual Time: 8588-5027 OT Individual Time Calculation (min): 72 min    Short Term Goals: Week 1:  OT Short Term Goal 1 (Week 1): Pt will increase sit to stand from standard height seat to Min A with use of RW while receiving 1 cue if needed for technique.  Skilled Therapeutic Interventions/Progress Updates:  Pt greeted seated in w/c, pt  agreeable to OT intervention. General education provided on CVA as pt reports he was in a "coma." Pt declines any real deficits from CVA. Total A transport to gym in w/c for time mgmt.  Pt completed below assessments with LUE surprisingly with LUE more coordinated and stronger than dominant RUE.                  9 Hole Peg Test is used to measure finger dexterity in pts with various neurological diagnoses. - Instructions The pt was instructed to pick up the pegs one at a time, using their dominant hand first and put them into the holes in any order until the holes were all filled. The pt then removed the pegs one at a time and returned them to the container. Both hands were tested separately.  - Results The pt completed the test in LUE- 54 seconds, RUE- > 1 min ( pt reports his R hand is worse because his R hand is too cold). Scores are based on the time taken to complete the activity. The timer started the moment the pt touched the first peg until the moment the last peg hit the container.  - Norms for healthy males ages 59-70+ 68-55 R 18.9 L 19.84 56-60 R 20.90 L 21.64 61-65 R 20.87 L 21.60 66-70 R 21.23 L 22.29 71+ R 25.79 L 25.95  The Dynamometer Grip Strength Test is a quantitative and objective measure of isometric muscular strength of the hand and forearm.  -Instructions The patient was asked to sit with their back, pelvis, and knees at 90 degrees. The shoulder was adducted and neutrally  rotated with The elbow flexed to 90 degrees and forearm in neutral. The arm was not supported.  -Results The score was determined by calculating the average of 3 trials. The pt's average score was 36 lbs in the R hand and 66 lbs in the L hand.  -Norms Males Average in lbs 55-59 R 101.1 L 83.2 60-64 R 89.7 L 76.8 65-69 R 91.1 L 76.8 70-74 R 75.3 L 64.8 75+ R 65.7 L 55.0 Assessed using the Pill Box Test. Pt failed the assessment, demonstrating poor planning, mental flexibility, suboptimal search strategies, concrete thinking and the inability to multitask. Pt only completed one pill bottle for the whole week and took > 5 mins to complete just one pill bottle. Reviewed pts mistake with pt with pt then able to correct mistake, of note pt does not use pill organizer therefore pt completing novel task. Did complete one additional pill bottle together with pt needing MAX cues to problem solve where to place pills. At this time, recommend daughter assist with all med mgmt tasks at home.   Pt completed x10 sit>stands from low surface with RW and CGA to facilitate LB strength/endurance. Pt completed transfers with Rw and MIN A. VSS during session.  Pt left seated in recliner with alarm belt activated and all needs within reach.    Therapy Documentation Precautions:  Precautions  Precautions: Fall Precaution Comments: L hemiparesis Restrictions Weight Bearing Restrictions: No  Pain: no pain reported during session     Therapy/Group: Individual Therapy  [name] 12/03/2021, 3:50 PM

## 2021-12-04 DIAGNOSIS — I6381 Other cerebral infarction due to occlusion or stenosis of small artery: Secondary | ICD-10-CM | POA: Diagnosis not present

## 2021-12-04 DIAGNOSIS — R7989 Other specified abnormal findings of blood chemistry: Secondary | ICD-10-CM | POA: Diagnosis not present

## 2021-12-04 DIAGNOSIS — I10 Essential (primary) hypertension: Secondary | ICD-10-CM | POA: Diagnosis not present

## 2021-12-04 DIAGNOSIS — K5900 Constipation, unspecified: Secondary | ICD-10-CM | POA: Diagnosis not present

## 2021-12-04 NOTE — Progress Notes (Signed)
Occupational Therapy Session Note  Patient Details  Name: Kevin [name] MRN: [id number] Date of Birth: [birth year]  Today's Date: 12/04/2021 OT Individual Time: 4196-2229 OT Individual Time Calculation (min): 70 min    Short Term Goals: Week 1:  OT Short Term Goal 1 (Week 1): Pt will increase sit to stand from standard height seat to Min A with use of RW while receiving 1 cue if needed for technique.  Skilled Therapeutic Interventions/Progress Updates:  Skilled OT intervention completed with focus on functional endurance and safety education within a shower context. Pt received upright in bed, no c/o pain, agreeable to shower.   Pt consistently referred to his current location as "here in [place]" with pt unable to recall that he is at [place], but able to state "they tell me I had a stroke." Provided re-orientation to place. However demonstrated deficits in [facility], with repeated questions and re-orientation needed. Pt also presents with slow processing, and required multimodal cues and extended time to complete tasks during ADLs.  Completed supervision bed mobility, sit > stand with min A using RW with cues for placing RUE on walker. Ambulatory transfer with CGA using RW with cues for positioning up over incline to bathroom and stand pivot to tub bench. Water proof cover applied to IV prior to shower. Able to bathe with overall min A for posterior peri-area at the seated level, with pt using anterior leans for accessing feet, however would benefit from [facility] sponge as pt's sitting balance required intermittent CGA during bathing due to postural swaying. Intermittent cues needed for controlling water temp and using adequate soap.   Mod A sit > stand up using grab bars from bench, then min A side step and stand pivot to w/c with RW. Completed UB dressing with supervision, cues for efficiency, as well as LB dressing with mod A (able to thread pull up with supervision however due to time constraint needed  assist for threading pants) at the sit > stand level using sink for balance with min A needed for balance without RW. Introduced Surveyor, quantity for ease with LB dressing however pt politely declining.  Donned TEDS per order, for time. Pt was able to get feet into both shoes with time, however had difficulty with tying shoe laces- mentioned elastic laces to him and his daughter who was present at end of session of modifying footwear process if needed. Pt remained seated in w/c, with belt alarm on, all needs met and daughter present at pt's side at end of session.   Therapy Documentation Precautions:  Precautions Precautions: Fall Precaution Comments: L hemiparesis Restrictions Weight Bearing Restrictions: No    Therapy/Group: Individual Therapy  Blase Mess, MS, OTR/L  12/04/2021, 9:25 AM

## 2021-12-04 NOTE — Progress Notes (Signed)
Physical Therapy Session Note  Patient Details  Name: Kevin [name] MRN: [id number] Date of Birth: 11-11-1933  Today's Date: 12/04/2021 PT Individual Time: 6546-5035 PT Individual Time Calculation (min): 73 min   Short Term Goals: Week 1:  PT Short Term Goal 1 (Week 1): = to LTG's due to ELOS  Skilled Therapeutic Interventions/Progress Updates:     Pt greeted seated in [facility] and agreeable to therapy. No c/o pain.   Pt transport to and from gym in [facility] for time management.   Gait training:  184f CGA with RW  Pt demonstrating the following gait deviations and SPT providing the described cuing and facilitation for improvement:  - fwd flex posture that required frequent verbal cues for correction  - occasional decreased L foot swing through with toe drag. Pt improving with swing through when given verbal cues.  - pt pushing RW too far from BOS when turning, but improving with cues and education on safe turning.   [facility]<> [facility] with min assist, B UE support on arm rests, and increased time needed for pt to perform.   Sit<> stand from EOM with CGA and with mat height at 69cm and no UE support to work on increased LE strength. Pt performed x 10 with increased speed. Pt having trouble with standing erect once full standing and tends to have a posterior lean.   Sit<> stand from EOM and CGA with mat height at 67cm and fwd bending to reach for horseshoes. Mirror set up in front of pt for mirror feedback to improve upright posture one standing. Once standing pt reached fwd to hang horseshoe on top of mirror. Performed x 8.   Fwd bending in standing also challenged with standing horseshoe toss activity. Min/mod assist given for steadiness and balance throughout task. Pt reached out of BOS to grab horseshoe then bent fwd and stepped with L foot to throw horseshoe. Pt lost balance a few times with mod assist to correct. Pt also showing L LE inattention and difficulty with placement of L  foot evident by frequent verbal and visual cues needed to place foot correctly for optimal balance. Activity performed 2x8   The follow Agility ladder activities performed with B HHA (SPT on L, DPT on R) and min assist for balance and steadiness. Pt performed stepping slowly due to decreased balance. Frequent cuing for upright posture during both activities.  - fwd stepping (one foot in each box). Pt required cues to pick up left foot more to avoid dragging L toe and kicking the ladder. Pt also cued to decrease L hip ER with initial L foot contact.  - side stepping (R and L). Pt initially cued to increase side step length to which he immediately corrected.   Pt left seated in WC with breaks on, call bell in reach, all needs met.   Therapy Documentation Precautions:  Precautions Precautions: Fall Precaution Comments: L hemiparesis Restrictions Weight Bearing Restrictions: No  Therapy/Group: Individual Therapy  AKayleen Memos SPT 12/04/2021, 4:55 PM

## 2021-12-04 NOTE — Plan of Care (Signed)
  Problem: RH BOWEL ELIMINATION Goal: RH STG MANAGE BOWEL WITH ASSISTANCE Description: STG Manage Bowel with mod I  Assistance. Outcome: Progressing Goal: RH STG MANAGE BOWEL W/MEDICATION W/ASSISTANCE Description: STG Manage Bowel with Medication with  mod I Assistance. Outcome: Progressing   Problem: RH KNOWLEDGE DEFICIT Goal: RH STG INCREASE KNOWLEDGE OF STROKE PROPHYLAXIS Description: Patient and daughter will be able to manage secondary stroke risks with medications and dietary modifications using handouts and educational resources independently Outcome: Progressing   Problem: RH SAFETY Goal: RH STG ADHERE TO SAFETY PRECAUTIONS W/ASSISTANCE/DEVICE Description: STG Adhere to Safety Precautions With cues Assistance/Device. Outcome: Not Progressing

## 2021-12-04 NOTE — Plan of Care (Signed)
  Problem: RH Balance Goal: LTG Patient will maintain dynamic standing with ADLs (OT) Description: LTG:  Patient will maintain dynamic standing balance with assist during activities of daily living (OT)  Flowsheets (Taken 12/04/2021 1231) LTG: Pt will maintain dynamic standing balance during ADLs with: Supervision/Verbal cueing   Problem: RH Bathing Goal: LTG Patient will bathe all body parts with assist levels (OT) Description: LTG: Patient will bathe all body parts with assist levels (OT) Flowsheets (Taken 12/04/2021 1231) LTG: Pt will perform bathing with assistance level/cueing: Supervision/Verbal cueing   Problem: RH Dressing Goal: LTG Patient will perform upper body dressing (OT) Description: LTG Patient will perform upper body dressing with assist, with/without cues (OT). Flowsheets (Taken 12/04/2021 1231) LTG: Pt will perform upper body dressing with assistance level of: Independent with assistive device Goal: LTG Patient will perform lower body dressing w/assist (OT) Description: LTG: Patient will perform lower body dressing with assist, with/without cues in positioning using equipment (OT) Flowsheets (Taken 12/04/2021 1231) LTG: Pt will perform lower body dressing with assistance level of: Supervision/Verbal cueing   Problem: RH Toileting Goal: LTG Patient will perform toileting task (3/3 steps) with assistance level (OT) Description: LTG: Patient will perform toileting task (3/3 steps) with assistance level (OT)  Flowsheets (Taken 12/04/2021 1231) LTG: Pt will perform toileting task (3/3 steps) with assistance level: Supervision/Verbal cueing   Problem: RH Toilet Transfers Goal: LTG Patient will perform toilet transfers w/assist (OT) Description: LTG: Patient will perform toilet transfers with assist, with/without cues using equipment (OT) Flowsheets (Taken 12/04/2021 1231) LTG: Pt will perform toilet transfers with assistance level of: Supervision/Verbal cueing   Problem: RH  Tub/Shower Transfers Goal: LTG Patient will perform tub/shower transfers w/assist (OT) Description: LTG: Patient will perform tub/shower transfers with assist, with/without cues using equipment (OT) Flowsheets (Taken 12/04/2021 1231) LTG: Pt will perform tub/shower stall transfers with assistance level of: Supervision/Verbal cueing LTG: Pt will perform tub/shower transfers from:  Walk in shower  Tub/shower combination

## 2021-12-04 NOTE — Progress Notes (Signed)
PROGRESS NOTE   Subjective/Complaints: Pt reports he feels good after just having a shower. No new concerns or complaints.    Review of Systems  Constitutional:  Negative for chills and fever.  HENT:  Negative for congestion.   Eyes:  Negative for double vision.  Respiratory:  Negative for cough and shortness of breath.   Cardiovascular:  Negative for chest pain.  Gastrointestinal: Negative.   Genitourinary: Negative.   Musculoskeletal: Negative.   Neurological:  Positive for focal weakness.    Objective:   No results found. Recent Labs    12/02/21 1519 12/03/21 0536  WBC 4.9 5.7  HGB 12.2* 10.9*  HCT 37.9* 33.5*  PLT 329 311    Recent Labs    12/02/21 1519 12/03/21 0536  NA  --  139  K  --  4.4  CL  --  107  CO2  --  25  GLUCOSE  --  103*  BUN  --  30*  CREATININE 0.91 1.22  CALCIUM  --  9.0     Intake/Output Summary (Last 24 hours) at 12/04/2021 0811 Last data filed at 12/04/2021 0802 Gross per 24 hour  Intake 417 ml  Output 800 ml  Net -383 ml         Physical Exam: Vital Signs Blood pressure (!) 121/58, pulse 68, temperature 97.8 F (36.6 C), resp. rate 17, height 6\' 2"  (1.88 m), weight 72.5 kg, SpO2 100 %.   General: No apparent distress, sitting in chair HEENT: Head is normocephalic, atraumatic, conjugate gaze, oral mucosa pink and moist, dentition absent Neck: Supple without JVD or lymphadenopathy Heart: Reg rate and rhythm. No murmurs rubs or gallops Chest: CTA bilaterally without wheezes, rales, or rhonchi; no distress Abdomen: Soft, non-tender, non-distended, bowel sounds positive. Extremities: No clubbing, cyanosis, or edema. Pulses are 2+ Psych: Pt's affect is appropriate. Pt is cooperative. Very pleasant. Skin: warm and dry Neuro:  Alert and oriented to person, place and situation. Not oriented to time. Follows simple commands. Can name and repeat.  Slightly slow processing. CN  2-12 intact.  Sensation intact to LT in all 4 extemities Strength 5/5 in b/l UE Strength 5/5 in RUE Strength 4/5 proximal 4+/5 distal LLE FTN normal RUE, slightly decrease LUE DTR 2+ and symmetric throughout, no clonus Musculoskeletal: Hallus valgus b/l, joint swelling L knee but no tenderness, denies knee pain No abnormal tone noted Decreased intrinsic muscle bulk in b/l hands    Assessment/Plan: 1. Functional deficits which require 3+ hours per day of interdisciplinary therapy in a comprehensive inpatient rehab setting. Physiatrist is providing close team supervision and 24 hour management of active medical problems listed below. Physiatrist and rehab team continue to assess barriers to discharge/monitor patient progress toward functional and medical goals  Care Tool:  Bathing    Body parts bathed by patient: Right arm, Left arm, Chest, Front perineal area, Buttocks, Abdomen, Right upper leg, Left upper leg, Face   Body parts bathed by helper: Left lower leg, Right lower leg     Bathing assist Assist Level: Minimal Assistance - Patient > 75%     Upper Body Dressing/Undressing Upper body dressing   What is the  patient wearing?: Pull over shirt    Upper body assist Assist Level: Set up assist    Lower Body Dressing/Undressing Lower body dressing      What is the patient wearing?: Underwear/pull up, Pants     Lower body assist Assist for lower body dressing: Supervision/Verbal cueing     Toileting Toileting    Toileting assist Assist for toileting: Minimal Assistance - Patient > 75%     Transfers Chair/bed transfer  Transfers assist     Chair/bed transfer assist level: Moderate Assistance - Patient 50 - 74%     Locomotion Ambulation   Ambulation assist      Assist level: Moderate Assistance - Patient 50 - 74% Assistive device: Hand held assist Max distance: 46 ft   Walk 10 feet activity   Assist     Assist level: Moderate Assistance -  Patient - 50 - 74% Assistive device: Hand held assist   Walk 50 feet activity   Assist Walk 50 feet with 2 turns activity did not occur: Safety/medical concerns         Walk 150 feet activity   Assist Walk 150 feet activity did not occur: Safety/medical concerns         Walk 10 feet on uneven surface  activity   Assist Walk 10 feet on uneven surfaces activity did not occur: Safety/medical concerns         Wheelchair     Assist Is the patient using a wheelchair?: Yes (transported pt to and from gym for time management) Type of Wheelchair: Manual    Wheelchair assist level: Dependent - Patient 0% Max wheelchair distance: 167ft    Wheelchair 50 feet with 2 turns activity    Assist        Assist Level: Dependent - Patient 0%   Wheelchair 150 feet activity     Assist      Assist Level: Dependent - Patient 0%   Blood pressure (!) 121/58, pulse 68, temperature 97.8 F (36.6 C), resp. rate 17, height 6\' 2"  (1.88 m), weight 72.5 kg, SpO2 100 %.  Medical Problem List and Plan: 1. Functional deficits secondary to right ischemic thalamic infarction             -patient may shower             -ELOS/Goals: 7-10 days, Min A with PT/OT, sup with SLP             -Continue CIR with PT/OT/SLP 2.  Antithrombotics: -DVT/anticoagulation:  Pharmaceutical: Heparin             -antiplatelet therapy: Aspirin 81 mg daily and Plavix 75 mg daily x3 weeks then aspirin alone 3. Pain Management: Tylenol as needed 4. Mood/Behavior/Sleep: Provide emotional support             -antipsychotic agents: N/A 5. Neuropsych/cognition: This patient is capable of making decisions on his own behalf. 6. Skin/Wound Care: Routine skin checks 7. Fluids/Electrolytes/Nutrition: Routine in and outs with follow-up chemistries 8.  Hypertension.  Norvasc 10 mg daily, hydralazine 25 mg every 8 hours.  Monitor with increased mobility. Avoid hypotension.   -10/6 well controlled, monitor 9.   Hyperlipidemia.  Lipitor. Heart healthy diet 10.  CAD/nonobstructive.  Continue aspirin Plavix.  No complaints of chest pain 11.  Constipation.  MiraLAX twice daily, Senokot nightly.             -LBM 9/3 large, monitor  -10/6 if no bm today consider additional medications 12.  Poor dentition             -Dys 3 diet 13. Anemia- mild             -HGB down a little to 10.9, continue to monitor  14. Azotemia  -BUN up to 30, encourage oral fluids  -Recheck tomorrow BMP     LOS: 2 days A FACE TO FACE EVALUATION WAS PERFORMED  Jennye Boroughs 12/04/2021, 8:11 AM

## 2021-12-04 NOTE — Plan of Care (Signed)
  Problem: RH Problem Solving Goal: LTG Patient will demonstrate problem solving for (SLP) Description: LTG:  Patient will demonstrate problem solving for basic/complex daily situations with cues  (SLP) Flowsheets (Taken 12/04/2021 1529) LTG: Patient will demonstrate problem solving for (SLP): Complex daily situations LTG Patient will demonstrate problem solving for: Supervision   Problem: RH Memory Goal: LTG Patient will use memory compensatory aids to (SLP) Description: LTG:  Patient will use memory compensatory aids to recall biographical/new, daily complex information with cues (SLP) Flowsheets (Taken 12/04/2021 1529) LTG: Patient will use memory compensatory aids to (SLP): Minimal Assistance - Patient > 75%   Problem: RH Awareness Goal: LTG: Patient will demonstrate awareness during functional activites type of (SLP) Description: LTG: Patient will demonstrate awareness during functional activites type of (SLP) Flowsheets (Taken 12/04/2021 1529) Patient will demonstrate during cognitive/linguistic activities awareness type of: Anticipatory LTG: Patient will demonstrate awareness during cognitive/linguistic activities with assistance of (SLP): Supervision

## 2021-12-04 NOTE — Evaluation (Signed)
Speech Language Pathology Assessment and Plan  Patient Details  Name: Kevin [name] MRN: [id number] Date of Birth: 1933/08/29  SLP Diagnosis: Cognitive Impairments  Rehab Potential: Excellent ELOS: 7-10 days    Today's Date: 12/04/2021 SLP Individual Time: 0258-5277 SLP Individual Time Calculation (min): 39 min   Hospital Problem: Principal Problem:   Right thalamic infarction [facility])  Past Medical History:  Past Medical History:  Diagnosis Date   Anemia    Colon polyps    Coronary artery disease    Hematuria    History of hiatal hernia    Hypertension    Past Surgical History:  Past Surgical History:  Procedure Laterality Date   COLONOSCOPY WITH ESOPHAGOGASTRODUODENOSCOPY (EGD)     COLONOSCOPY WITH PROPOFOL N/A 09/27/2014   Procedure: COLONOSCOPY WITH PROPOFOL;  Surgeon: Manya Silvas, MD;  Location: [place];  Service: Endoscopy;  Laterality: N/A;   JOINT REPLACEMENT      Assessment / Plan / Recommendation Clinical Impression Patient is an 88 year old right-handed male with history of hypertension, CAD/nonobstructive, chronic anemia as well as recent oral surgery with full mouth teeth extraction.  Per chart review patient lives with adult aged grandchildren.  1 level home.  Independent with occasional cane. Independent ADLs/drives and does lawn care.  Presented [facility]20[facility] with acute onset of left-sided weakness/transient confusion resulting a fall.  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  Therapy evaluations completed due to patient left-sided weakness decreased functional mobility was admitted for a comprehensive rehab program. Patient admitted 12/02/21.  Upon arrival, patient was awake while upright in the wheelchair with his family present. Patient reported he had not noticed any cognitive changes since stroke with his daughter reporting mild memory deficits at baseline. Patient's auditory comprehension and verbal expression were [facility] for all tasks  assessed. Patient was also 100% intelligible despite being edentulous. SLP administered the [facility] Mental Status Examination (SLUMS). Patient scored  10/30 points with a score of 27 or above considered normal with deficits in orientation, problem solving and recall noted. Patient reports being independent at baseline despite having 24 hour supervision from family with his family only assisting intermittently with money management. Due to patient's high level of independence at baseline, recommend skilled SLP intervention to maximize his functional problem solving, recall with use of strategies, safety awareness and to reduce caregiver burden at discharge. Patient and his daughter verbalized understanding and agreement.       Skilled Therapeutic Interventions          Administered a cognitive-linguistic evaluation, please see above for details.   SLP Assessment  Patient will need skilled [place] during CIR admission    Recommendations  Oral Care Recommendations: Oral care BID Patient destination: Home Follow up Recommendations: 24 hour supervision/assistance;Outpatient SLP Equipment Recommended: None recommended by SLP    SLP Frequency 3 to 5 out of 7 days   SLP Duration  SLP Intensity  SLP Treatment/Interventions 7-10 days  Minumum of 1-2 x/day, 30 to 90 minutes  Cognitive remediation/compensation;Internal/external aids;Cueing hierarchy;Environmental controls;Therapeutic Activities;Functional tasks;Patient/family education    Pain No/Denies Pain   SLP Evaluation Cognition Overall Cognitive Status: Impaired/Different from baseline Arousal/Alertness: Awake/alert Orientation Level: Oriented to person;Disoriented to time;Oriented to place;Oriented to situation Focused Attention: Appears intact Sustained Attention: Appears intact Memory: Impaired Memory Impairment: Decreased short term memory;Decreased recall of new information Decreased  Short Term Memory: Verbal basic Awareness: Impaired Awareness Impairment: Anticipatory impairment Problem Solving: Impaired Problem Solving Impairment: Functional complex Safety/Judgment: Impaired  Comprehension Auditory Comprehension Overall Auditory Comprehension: Appears within functional limits for tasks assessed Visual Recognition/Discrimination Discrimination: Not tested Reading Comprehension Reading Status: Not tested Expression Expression Primary Mode of Expression: Verbal Verbal Expression Overall Verbal Expression: Appears within functional limits for tasks assessed Written Expression Dominant Hand: Right Written Expression: Not tested Oral Motor Oral Motor/Sensory Function Overall Oral Motor/Sensory Function: Within functional limits Motor Speech Overall Motor Speech: Appears within functional limits for tasks assessed  Care Tool Care Tool Cognition Ability to hear (with hearing aid or hearing appliances if normally used Ability to hear (with hearing aid or hearing appliances if normally used): 0. Adequate - no difficulty in normal conservation, social interaction, listening to TV   Expression of Ideas and Wants Expression of Ideas and Wants: 4. Without difficulty (complex and basic) - expresses complex messages without difficulty and with speech that is clear and easy to understand   Understanding Verbal and Non-Verbal Content Understanding Verbal and Non-Verbal Content: 3. Usually understands - understands most conversations, but misses some part/intent of message. Requires cues at times to understand  Memory/Recall Ability Memory/Recall Ability : That he or she is in a hospital/hospital unit    Short Term Goals: Week 1: SLP Short Term Goal 1 (Week 1): STGs=LTGs due to ELOS  Refer to Care Plan for Long Term Goals  Recommendations for other services: Neuropsych  Discharge Criteria: Patient will be discharged from SLP if patient refuses treatment 3 consecutive  times without medical reason, if treatment goals not met, if there is a change in medical status, if patient makes no progress towards goals or if patient is discharged from hospital.  The above assessment, treatment plan, treatment alternatives and goals were discussed and mutually agreed upon: by patient and by family  Janai Brannigan 12/04/2021, 3:22 PM

## 2021-12-05 DIAGNOSIS — K5900 Constipation, unspecified: Secondary | ICD-10-CM | POA: Diagnosis not present

## 2021-12-05 DIAGNOSIS — I6381 Other cerebral infarction due to occlusion or stenosis of small artery: Secondary | ICD-10-CM | POA: Diagnosis not present

## 2021-12-05 DIAGNOSIS — R7989 Other specified abnormal findings of blood chemistry: Secondary | ICD-10-CM | POA: Diagnosis not present

## 2021-12-05 DIAGNOSIS — I10 Essential (primary) hypertension: Secondary | ICD-10-CM | POA: Diagnosis not present

## 2021-12-05 LAB — BASIC METABOLIC PANEL
Anion gap: 10 (ref 5–15)
BUN: 22 mg/dL (ref 8–23)
CO2: 24 mmol/L (ref 22–32)
Calcium: 9.4 mg/dL (ref 8.9–10.3)
Chloride: 106 mmol/L (ref 98–111)
Creatinine, Ser: 0.87 mg/dL (ref 0.61–1.24)
GFR, Estimated: >60 mL/min (ref >60)
Glucose, Bld: 101 mg/dL — ABNORMAL HIGH (ref 70–99)
Potassium: 3.9 mmol/L (ref 3.5–5.1)
Sodium: 140 mmol/L (ref 135–145)

## 2021-12-05 MED ORDER — SORBITOL 70 % SOLN
60.0000 mL | Freq: Once | Status: AC
  Administered 2021-12-05: 60 mL via ORAL
  Filled 2021-12-05: qty 60

## 2021-12-05 NOTE — Progress Notes (Signed)
PROGRESS NOTE   Subjective/Complaints: Working in [employer] with PT. No new concerns or complaints.    Review of Systems  Constitutional:  Negative for chills, fever and malaise/fatigue.  HENT:  Negative for congestion.   Eyes:  Negative for double vision.  Respiratory:  Negative for cough and shortness of breath.   Cardiovascular:  Negative for chest pain.  Gastrointestinal:  Positive for constipation.  Genitourinary: Negative.   Musculoskeletal: Negative.   Neurological:  Positive for focal weakness.    Objective:   No results found. Recent Labs    12/02/21 1519 12/03/21 0536  WBC 4.9 5.7  HGB 12.2* 10.9*  HCT 37.9* 33.5*  PLT 329 311    Recent Labs    12/03/21 0536 12/05/21 0621  NA 139 140  K 4.4 3.9  CL 107 106  CO2 25 24  GLUCOSE 103* 101*  BUN 30* 22  CREATININE 1.22 0.87  CALCIUM 9.0 9.4     Intake/Output Summary (Last 24 hours) at 12/05/2021 1045 Last data filed at 12/05/2021 0700 Gross per 24 hour  Intake 840 ml  Output 600 ml  Net 240 ml         Physical Exam: Vital Signs Blood pressure (!) 145/85, pulse 72, temperature 98.6 F (37 C), temperature source Oral, resp. rate 18, height 6\' 2"  (1.88 m), weight 72.5 kg, SpO2 100 %.   General: No apparent distress, working with PT HEENT: Head is normocephalic, atraumatic, conjugate gaze, oral mucosa pink and moist, dentition absent Neck: Supple without JVD or lymphadenopathy Heart: Reg rate and rhythm. No murmurs rubs or gallops Chest: CTA bilaterally without wheezes, rales, or rhonchi; no distress Abdomen: Soft, non-tender, non-distended, bowel sounds positive. Extremities: No clubbing, cyanosis, or edema. Pulses are 2+ Psych: Pt's affect is appropriate. Pt is cooperative. Very pleasant. Skin: warm and dry Neuro:  Alert and oriented to person, place and situation. Not oriented to date. Follows commands. Can name and repeat.  Slightly slow  processing. CN 2-12 intact.  Moves all 4 extremities  From Prior exam Sensation intact to LT in all 4 extemities Strength 5/5 in b/l UE Strength 5/5 in RUE Strength 4/5 proximal 4+/5 distal LLE FTN normal RUE, slightly decrease LUE DTR 2+ and symmetric throughout, no clonus Musculoskeletal: Hallus valgus b/l, joint swelling L knee but no tenderness, denies knee pain No abnormal tone noted Decreased intrinsic muscle bulk in b/l hands    Assessment/Plan: 1. Functional deficits which require 3+ hours per day of interdisciplinary therapy in a comprehensive inpatient rehab setting. Physiatrist is providing close team supervision and 24 hour management of active medical problems listed below. Physiatrist and rehab team continue to assess barriers to discharge/monitor patient progress toward functional and medical goals  Care Tool:  Bathing    Body parts bathed by patient: Right arm, Left arm, Chest, Front perineal area, Abdomen, Right upper leg, Left upper leg, Face, Right lower leg, Left lower leg   Body parts bathed by helper: Buttocks     Bathing assist Assist Level: Minimal Assistance - Patient > 75%     Upper Body Dressing/Undressing Upper body dressing   What is the patient wearing?: Pull over shirt  Upper body assist Assist Level: Supervision/Verbal cueing    Lower Body Dressing/Undressing Lower body dressing      What is the patient wearing?: Underwear/pull up, Pants     Lower body assist Assist for lower body dressing: Moderate Assistance - Patient 50 - 74%     Toileting Toileting    Toileting assist Assist for toileting: Minimal Assistance - Patient > 75%     Transfers Chair/bed transfer  Transfers assist     Chair/bed transfer assist level: Moderate Assistance - Patient 50 - 74%     Locomotion Ambulation   Ambulation assist      Assist level: Moderate Assistance - Patient 50 - 74% Assistive device: Hand held assist Max distance: 46 ft    Walk 10 feet activity   Assist     Assist level: Moderate Assistance - Patient - 50 - 74% Assistive device: Hand held assist   Walk 50 feet activity   Assist Walk 50 feet with 2 turns activity did not occur: Safety/medical concerns         Walk 150 feet activity   Assist Walk 150 feet activity did not occur: Safety/medical concerns         Walk 10 feet on uneven surface  activity   Assist Walk 10 feet on uneven surfaces activity did not occur: Safety/medical concerns         Wheelchair     Assist Is the patient using a wheelchair?: Yes (transported pt to and from gym for time management) Type of Wheelchair: Manual    Wheelchair assist level: Dependent - Patient 0% Max wheelchair distance: 13ft    Wheelchair 50 feet with 2 turns activity    Assist        Assist Level: Dependent - Patient 0%   Wheelchair 150 feet activity     Assist      Assist Level: Dependent - Patient 0%   Blood pressure (!) 145/85, pulse 72, temperature 98.6 F (37 C), temperature source Oral, resp. rate 18, height 6\' 2"  (1.88 m), weight 72.5 kg, SpO2 100 %.  Medical Problem List and Plan: 1. Functional deficits secondary to right ischemic thalamic infarction             -patient may shower             -ELOS/Goals: 7-10 days, Min A with PT/OT, sup with SLP             -Continue CIR with PT/OT/SLP 2.  Antithrombotics: -DVT/anticoagulation:  Pharmaceutical: Heparin             -antiplatelet therapy: Aspirin 81 mg daily and Plavix 75 mg daily x3 weeks then aspirin alone 3. Pain Management: Tylenol as needed 4. Mood/Behavior/Sleep: Provide emotional support             -antipsychotic agents: N/A 5. Neuropsych/cognition: This patient is capable of making decisions on his own behalf. 6. Skin/Wound Care: Routine skin checks 7. Fluids/Electrolytes/Nutrition: Routine in and outs with follow-up chemistries 8.  Hypertension.  Norvasc 10 mg daily, hydralazine 25 mg  every 8 hours.  Monitor with increased mobility. Avoid hypotension.   -10/7 fair control, continue to monitor trend 9.  Hyperlipidemia.  Lipitor. Heart healthy diet 10.  CAD/nonobstructive.  Continue aspirin Plavix.  No complaints of chest pain 11.  Constipation.  MiraLAX twice daily, Senokot nightly.             -LBM 9/3 large, monitor  -10/6 if no bm today consider additional  medications  -10/7 Will order sorbitol for constipation 12. Poor dentition             -Dys 3 diet 13. Anemia- mild             -HGB down a little to 10.9, recheck monday 14. Azotemia  -10/5 BUN up to 30, encourage oral fluids  -10/7 Bun down to 22, Cr 0.87 improved, continue to monitor     LOS: 3 days A FACE TO FACE EVALUATION WAS PERFORMED  Fanny Dance 12/05/2021, 10:45 AM

## 2021-12-05 NOTE — Progress Notes (Signed)
Occupational Therapy Session Note  Patient Details  Name: Kevin [name] MRN: [id number] Date of Birth: 1933-05-26  Today's Date: 12/05/2021 OT Individual Time: 1000-1100 OT Individual Time Calculation (min): 60 min    Short Term Goals: Week 1:  OT Short Term Goal 1 (Week 1): Pt will increase sit to stand from standard height seat to Min A with use of RW while receiving 1 cue if needed for technique.  Skilled Therapeutic Interventions/Progress Updates:    Upon OT arrival, pt seated in recliner reporting no pain. Pt agreeable to OT treatment session. Treatment intervention with a focus on self care retraining and UE strengthening. Pt doff/donn shoes with SBA and increased time required. Pt did not want to use elastic shoes laces. Pt completes stand pivot transfer from recliner to w/c with CGA. Pt was transported infornt of sink to complete oral care with Supervision. Pt was transported to dayroom gym via w/c and total A for time. While seated at tabletop, 1.5lb wrist weights donned to B UE and pt flips over playing cards on table and sorts based on suit. Pt requires increased time to sort cards but had no errors. Pt then sorts each pile in numerical order with min difficulty but no errors. Pt propels himself via w/c to his room with increased time and effort and completes stand pivot transfer from w/c to recliner with CGA. Pt was left in recliner at end of session with all needs met and safety measures in place.   Therapy Documentation Precautions:  Precautions Precautions: Fall Precaution Comments: L hemiparesis Restrictions Weight Bearing Restrictions: No    Therapy/Group: Individual Therapy  Lilian Fuhs 12/05/2021, 1:26 PM

## 2021-12-05 NOTE — Progress Notes (Signed)
Physical Therapy Session Note  Patient Details  Name: Kevin [name] MRN: [id number] Date of Birth: Nov 15, 1933  Today's Date: 12/05/2021 PT Individual Time: [phone] and 1420-1450 PT Individual Time Calculation (min): 56 min and 30 min  Short Term Goals: Week 1:  PT Short Term Goal 1 (Week 1): = to LTG's due to ELOS  Skilled Therapeutic Interventions/Progress Updates:    AM SESSION Pt initially supine and agreeable to session Supine to sit to edge of bed w/cues to advance L hip equal to R. Dons R shoe w/set up, L w/mod assist.  Additional time allowed to tie for coordination challenge.  Maintains balance w/supervision. stand pivot transfer to wc w/min assist.  Gait 153ft w/RW w/decreased clearance LLE thru swing, cues to attend to L, cues for upright posture, mild L lean  Standing NMRE: Standing w/1Lb med ball overhead lift x 10, cga Standing forward tap of 5in cone, dysmetric L>R, progressive tendency to leand to L w/fatigue, cues to "reset" as needed/pt w/decreased awarenss of lean Standing Lateral tap of 5in cone, dysmetric LLE, when performing w/R leans L in progressive fashion/decreased awareness, cues required to correct.  Uses RW for additional support w/task.  Gait 87ft w/RW w/cga, cues for upright posture, cues to focus on heelstrike LLE, cues to maintain safe distance within walker.  Stairs:  ascended/descended 4 stairs w/2 rails step over step w/cues to attend to L foot placement, cues for posture, cga to light min assist.  Pt transported to room.  Handed off to PT tech for stand pivot transfer to recliner/set up/alarm.   PM SESSION Denies pain Sit to stand from recliner w/cues for foot placement.  Gait in room practicing negotiation of tight spaces w/cga, light cueing for problem solving, cues for posture.  Continued gait x 144ft to dayroom w/cga, deviations as described above, tasked w/scanning hallway and reading room numbers located on L.  Pt has to stop ambulation and  requires time to scan and locate/at times requires cueing.  Nustep x 36min L5 w/exts x 4 for global conditioning.  Required towel padding of pedals due to sensitivity of soles.  Gait 177ft w/RW w/deviations/assist and cueing as above.  Returns to recliner w/minimal cueing for backing/hand placement. Pt left oob in recliner w/chair alarm set and needs in reach.  Therapy Documentation Precautions:  Precautions Precautions: Fall Precaution Comments: L hemiparesis Restrictions Weight Bearing Restrictions: No General:   V Therapy/Group: Individual Therapy  Jerrilyn Cairo 12/05/2021, 8:52 AM

## 2021-12-05 NOTE — Progress Notes (Signed)
Speech Language Pathology Daily Session Note  Patient Details  Name: Kevin [name] MRN: [id number] Date of Birth: November 22, 1933  Today's Date: 12/05/2021 SLP Individual Time: 2595-6387 SLP Individual Time Calculation (min): 54 min  Short Term Goals: Week 1: SLP Short Term Goal 1 (Week 1): STGs=LTGs due to ELOS  Skilled Therapeutic Interventions: Skilled treatment session focused on cognitive goals. Upon arrival, patient was awake while upright in the recliner. Patient perseverative on his daughters not being present and required Min verbal cues for redirection. SLP facilitated session by providing Max verbal cues for recall of his current medications and their functions. Attempted to introduce a TID pill box to patient due to having multiple medications that he now takes multiple times a day. Patient with decreased mental flexibility regarding new management system and reporting he could write the times down, use timing of television shows as reminders, etc. SLP educated that due to current memory deficits and changes in medications, a system that utilizes visual feedback would be most efficient/safe. Patient's daughters present during end of session and verbalized agreement. Recommend attempting to organize a TID pill box during next session. Patient requesting to use the bathroom and insisted on walking. Patient required Mod verbal cues to stand from the recliner. Patient ambulated to the commode and requested to stand despite SLP encouraging sitting for safety. Patient was incontinent of urine without awareness (pooling around feet) and eventually agreeable to sit in order to change his brief/pants. Nursing arrived to assist. SLP provided education to family regarding patient's decreased mental flexibility and its potential impact on his safety. Both verbalized understanding. Patient left sitting on commode with nursing present due to time constraints. Continue with current plan of care.       Pain No/Denies Pain   Therapy/Group: Individual Therapy  Kevin [name] 12/05/2021, 3:12 PM

## 2021-12-06 NOTE — Progress Notes (Signed)
LBM 10/03, scheduled  bowel meds given. One time dose of sorbitol given at 1754, no results. 2 attempts OOB without assistance. Bed alarm in place to alert staff. Easily redirected. Continent using urinal. Kevin [name] A

## 2021-12-06 NOTE — Progress Notes (Signed)
BP 107/62 manually checked. Spoke with Dr. Awilda Bill and gave orders to hold hydralazine.

## 2021-12-06 NOTE — IPOC Note (Signed)
Overall Plan of Care [facility]) Patient Details Name: Kevin [name] MRN: [id number] DOB: Dec 19, 1933  Admitting Diagnosis: Right thalamic infarction [facility])  Hospital Problems: Principal Problem:   Right thalamic infarction [facility])     Functional Problem List: Nursing Bowel, Medication Management, Safety, Endurance  PT Balance, Behavior, Endurance, Motor, Pain, Perception, Safety, Sensory, Skin Integrity, Edema  OT Balance, Cognition, Vision, Endurance, Motor, Safety, Perception  SLP Cognition  TR         Basic ADL's: OT Toileting, Dressing, Bathing, Grooming     Advanced  ADL's: OT Simple Meal Preparation, Light Housekeeping     Transfers: PT Bed Mobility, Bed to Chair, Customer service manager, Tub/Shower     Locomotion: PT Ambulation     Additional Impairments: OT    SLP Social Cognition   Problem Solving, Memory  TR      Anticipated Outcomes Item Anticipated Outcome  Self Feeding    Swallowing      Basic self-care  Mod I  Toileting  Mod I   Bathroom Transfers Mod I  Bowel/Bladder  manage bowel w mod I assist  Transfers  supervision with LRAD  Locomotion  supervision with LRAD  Communication     Cognition  Supervision  Pain  n/a  Safety/Judgment  manage w cues   Therapy Plan: PT Intensity: Minimum of 1-2 x/day ,45 to 90 minutes PT Frequency: 5 out of 7 days PT Duration Estimated Length of Stay: 7-10days OT Intensity: Minimum of 1-2 x/day, 45 to 90 minutes OT Frequency: 5 out of 7 days OT Duration/Estimated Length of Stay: 7-10 days SLP Intensity: Minumum of 1-2 x/day, 30 to 90 minutes SLP Frequency: 3 to 5 out of 7 days SLP Duration/Estimated Length of Stay: 7-10 days   Team Interventions: Nursing Interventions Bowel Management, Patient/Family Education, Disease Management/Prevention, Medication Management, Discharge Planning  PT interventions Ambulation/gait training, Cognitive remediation/compensation, Discharge planning, DME/adaptive equipment  instruction, Functional mobility training, Pain management, Psychosocial support, Splinting/orthotics, Therapeutic Activities, UE/LE Strength taining/ROM, Visual/perceptual remediation/compensation, Warden/ranger, Community reintegration, Disease management/prevention, Functional electrical stimulation, Neuromuscular re-education, Patient/family education, Skin care/wound management, Stair training, Therapeutic Exercise, UE/LE Coordination activities, Wheelchair propulsion/positioning  OT Interventions Warden/ranger, Neuromuscular re-education, Self Care/advanced ADL retraining, Therapeutic Exercise, Wheelchair propulsion/positioning, DME/adaptive equipment instruction, Pain management, Cognitive remediation/compensation, UE/LE Strength taining/ROM, UE/LE Coordination activities, Splinting/orthotics, Patient/family education, Functional electrical stimulation, Community reintegration, Functional mobility training, Discharge planning, Therapeutic Activities, Visual/perceptual remediation/compensation  SLP Interventions Cognitive remediation/compensation, Internal/external aids, Financial trader, Environmental controls, Therapeutic Activities, Functional tasks, Patient/family education  TR Interventions    SW/CM Interventions Discharge Planning, Psychosocial Support, Patient/Family Education, Disease Management/Prevention   Barriers to Discharge MD  Medical stability and Home enviroment access/loayout  Nursing Decreased caregiver support 1 level / level entry w dtr  PT      OT      SLP      SW Insurance for SNF coverage     Team Discharge Planning: Destination: PT-Home ,OT- Home , SLP-Home Projected Follow-up: PT-Outpatient PT, 24 hour supervision/assistance, OT-  None, SLP-24 hour supervision/assistance, Outpatient SLP Projected Equipment Needs: PT-To be determined, OT- Tub/shower seat, 3 in 1 bedside comode, SLP-None recommended by SLP Equipment Details: PT- ,  OT-Has a SPC and RW. Patient/family involved in discharge planning: PT- Patient,  OT-Patient, SLP-Patient, Family member/caregiver  MD ELOS: 7-10 days Medical Rehab Prognosis:  Excellent Assessment: The patient has been admitted for CIR therapies with the diagnosis of right ischemic thalamic infarction. The team will be addressing functional mobility, strength, stamina,  balance, safety, adaptive techniques and equipment, self-care, bowel and bladder mgt, patient and caregiver education. Goals have been set at [place] with PT/OT, sup with SLP. Anticipated discharge destination is home.       See Team Conference Notes for weekly updates to the plan of care

## 2021-12-07 DIAGNOSIS — I6381 Other cerebral infarction due to occlusion or stenosis of small artery: Secondary | ICD-10-CM | POA: Diagnosis not present

## 2021-12-07 MED ORDER — ENOXAPARIN SODIUM 40 MG/0.4ML IJ SOSY
40.0000 mg | PREFILLED_SYRINGE | INTRAMUSCULAR | Status: DC
  Administered 2021-12-07 – 2021-12-14 (×8): 40 mg via SUBCUTANEOUS
  Filled 2021-12-07 (×8): qty 0.4

## 2021-12-07 NOTE — Progress Notes (Signed)
Occupational Therapy Session Note  Patient Details  Name: Kevin [name] MRN: [id number] Date of Birth: 10-Aug-1933  Today's Date: 12/07/2021 OT Individual Time: 6812-7517 OT Individual Time Calculation (min): 63 min    Short Term Goals: Week 1:  OT Short Term Goal 1 (Week 1): Pt will increase sit to stand from standard height seat to Min A with use of RW while receiving 1 cue if needed for technique.  Skilled Therapeutic Interventions/Progress Updates:   Pt greeted seated in w/c, pt agreeable to OT intervention. Session focus on BADL reeducation, functional mobility, dynamic standing balance and decreasing overall caregiver burden.  Pt completed functional ambulation from w/c >bathroom with Rw and CGA. Pt entered session with CGA with use of grab bar. Pt noted to repeat same questions such as "do I need to take off these wrist bands?" Pt completed bathing with overall CGA from shower seat.pt exited shower with CGA to stand pivot to w/c with grab bars. Pt completed dressing from w/c level with set- up for UB dressing, and CGA for LB dressing as pt did have LOB when standing to pull up pants to waist line needing light assist to recover. Pt often stated very politely that "I'm fine, you don't have to hold on to me" yet present with balance impairments. Pt needing MIN A to don shoes.   pt left seated in w/c with alarm belt activated and all needs within reach.                   Therapy Documentation Precautions:  Precautions Precautions: Fall Precaution Comments: L hemiparesis Restrictions Weight Bearing Restrictions: No  Pain: No pain    Therapy/Group: Individual Therapy  Corinne Ports [facility] 12/07/2021, 12:04 PM

## 2021-12-07 NOTE — Progress Notes (Signed)
PROGRESS NOTE   Subjective/Complaints:   No issues overnite , no pain c/os  Review of Systems  Constitutional:  Negative for chills, fever and malaise/fatigue.  HENT:  Negative for congestion.   Eyes:  Negative for double vision.  Respiratory:  Negative for cough and shortness of breath.   Cardiovascular:  Negative for chest pain.  Gastrointestinal:  Positive for constipation.  Genitourinary: Negative.   Musculoskeletal: Negative.   Neurological:  Positive for focal weakness.    Objective:   No results found. No results for input(s): "WBC", "HGB", "HCT", "PLT" in the last 72 hours.  Recent Labs    12/05/21 0621  NA 140  K 3.9  CL 106  CO2 24  GLUCOSE 101*  BUN 22  CREATININE 0.87  CALCIUM 9.4     Intake/Output Summary (Last 24 hours) at 12/07/2021 0847 Last data filed at 12/07/2021 0700 Gross per 24 hour  Intake 1250 ml  Output 1650 ml  Net -400 ml         Physical Exam: Vital Signs Blood pressure (!) 168/76, pulse 79, temperature (!) 97.5 F (36.4 C), temperature source Oral, resp. rate 18, height 6\' 2"  (1.88 m), weight 72.5 kg, SpO2 99 %.    General: No acute distress Mood and affect are appropriate Heart: Regular rate and rhythm no rubs murmurs or extra sounds Lungs: Clear to auscultation, breathing unlabored, no rales or wheezes Abdomen: Positive bowel sounds, soft nontender to palpation, nondistended Extremities: No clubbing, cyanosis, or edema  Skin: warm and dry Neuro:  Alert and oriented to person, place and situation. Not oriented to date. Follows commands. Can name and repeat.  Slightly slow processing. CN 2-12 intact.  Moves all 4 extremities  From Prior exam Sensation intact to LT in all 4 extemities Strength 5/5 in b/l UE Strength 5/5 in RUE Strength 4/5 proximal 4+/5 distal LLE FTN normal RUE, slightly decrease LUE DTR 2+ and symmetric throughout, no clonus Musculoskeletal:  Hallus valgus b/l, joint swelling L knee but no tenderness, denies knee pain No abnormal tone noted Decreased intrinsic muscle bulk in b/l hands  Assessment/Plan: 1. Functional deficits which require 3+ hours per day of interdisciplinary therapy in a comprehensive inpatient rehab setting. Physiatrist is providing close team supervision and 24 hour management of active medical problems listed below. Physiatrist and rehab team continue to assess barriers to discharge/monitor patient progress toward functional and medical goals  Care Tool:  Bathing    Body parts bathed by patient: Right arm, Left arm, Chest, Front perineal area, Abdomen, Right upper leg, Left upper leg, Face, Right lower leg, Left lower leg   Body parts bathed by helper: Buttocks     Bathing assist Assist Level: Minimal Assistance - Patient > 75%     Upper Body Dressing/Undressing Upper body dressing   What is the patient wearing?: Pull over shirt    Upper body assist Assist Level: Supervision/Verbal cueing    Lower Body Dressing/Undressing Lower body dressing      What is the patient wearing?: Underwear/pull up, Pants     Lower body assist Assist for lower body dressing: Moderate Assistance - Patient 50 - 74%  Toileting Toileting    Toileting assist Assist for toileting: Minimal Assistance - Patient > 75%     Transfers Chair/bed transfer  Transfers assist     Chair/bed transfer assist level: Moderate Assistance - Patient 50 - 74%     Locomotion Ambulation   Ambulation assist      Assist level: Moderate Assistance - Patient 50 - 74% Assistive device: Hand held assist Max distance: 46 ft   Walk 10 feet activity   Assist     Assist level: Moderate Assistance - Patient - 50 - 74% Assistive device: Hand held assist   Walk 50 feet activity   Assist Walk 50 feet with 2 turns activity did not occur: Safety/medical concerns         Walk 150 feet activity   Assist Walk 150  feet activity did not occur: Safety/medical concerns         Walk 10 feet on uneven surface  activity   Assist Walk 10 feet on uneven surfaces activity did not occur: Safety/medical concerns         Wheelchair     Assist Is the patient using a wheelchair?: Yes (transported pt to and from gym for time management) Type of Wheelchair: Manual    Wheelchair assist level: Dependent - Patient 0% Max wheelchair distance: 130ft    Wheelchair 50 feet with 2 turns activity    Assist        Assist Level: Dependent - Patient 0%   Wheelchair 150 feet activity     Assist      Assist Level: Dependent - Patient 0%   Blood pressure (!) 168/76, pulse 79, temperature (!) 97.5 F (36.4 C), temperature source Oral, resp. rate 18, height 6\' 2"  (1.88 m), weight 72.5 kg, SpO2 99 %.  Medical Problem List and Plan: 1. Functional deficits secondary to right ischemic thalamic infarction             -patient may shower             -ELOS/Goals: 7-10 days, Min A with PT/OT, sup with SLP             -Continue CIR with PT/OT/SLP 2.  Antithrombotics: -DVT/anticoagulation:  Pharmaceutical: Heparin- switch to lovenox, pt has nl renal fxn             -antiplatelet therapy: Aspirin 81 mg daily and Plavix 75 mg daily x3 weeks (12/17/21) then aspirin alone 3. Pain Management: Tylenol as needed 4. Mood/Behavior/Sleep: Provide emotional support             -antipsychotic agents: N/A 5. Neuropsych/cognition: This patient is capable of making decisions on his own behalf. 6. Skin/Wound Care: Routine skin checks 7. Fluids/Electrolytes/Nutrition: Routine in and outs with follow-up chemistries 8.  Hypertension.  Norvasc 10 mg daily, hydralazine 25 mg every 8 hours.  Monitor with increased mobility. Avoid hypotension.    Vitals:   12/07/21 0332 12/07/21 0651  BP: (!) 134/48 (!) 168/76  Pulse: (!) 53 79  Resp: 18   Temp: (!) 97.5 F (36.4 C)   SpO2: 99%     9.  Hyperlipidemia.  Lipitor.  Heart healthy diet 10.  CAD/nonobstructive.  Continue aspirin Plavix.  No complaints of chest pain 11.  Constipation.  MiraLAX twice daily, Senokot nightly.             -  -10/7 large type 4 BM, increase senna to BID  12. Poor dentition             -  Dys 3 diet 13. Anemia- mild             -HGB down a little to 10.9, recheck monday 14. Azotemia    -10/7 Bun down to 22, Cr 0.87 improved, continue to monitor     LOS: 5 days A FACE TO FACE EVALUATION WAS PERFORMED  Erick Colace 12/07/2021, 8:47 AM

## 2021-12-07 NOTE — Progress Notes (Signed)
Slept in chair, with chair alarm in place until 0035. "I'm a night owl." Has slept good since going to bed. LBM 10/07. Continent using urinal. Denies pain. Independent with bed mobility. Impulsive at times. Alarms in place to remind patient and alert staff.Kevin [name] A

## 2021-12-07 NOTE — Progress Notes (Signed)
Speech Language Pathology Daily Session Note  Patient Details  Name: Kevin [name] MRN: [id number] Date of Birth: 1934/02/08  Today's Date: 12/07/2021 SLP Individual Time: 1430-1530 SLP Individual Time Calculation (min): 60 min  Short Term Goals: Week 1: SLP Short Term Goal 1 (Week 1): STGs=LTGs due to ELOS  Skilled Therapeutic Interventions: Skilled ST treatment focused on cognitive goals. SLP and pt completing medication management task to increase awareness of current medication regime. Pt had minimal recall of current medications as discussed in detail during previous SLP session dated 12/05/21. Pt required max A verbal cues for recall of current medications and their functions. Mod A field of choices were necessary following 3 minute delay. SLP facilitated TID pillbox organization with max A verbal cues medication label comprehension, organization, error awareness, and mental flexibility.  Pt exhibited difficulty comprehending hypothetical scenarios pertaining to managing a pillbox on a daily basis. Pt required mod A verbal redirection cues throughout session due to perseveration on the same story regarding his spouse's medications. At this time, recommend pt have complete assist with medication management at discharge due to severity of difficulty noted during today's task, however SLP to continue efforts with medication management systems, including consideration of pillbox organization. Patient was left in wheelchair with alarm activated and immediate needs within reach at end of session. Continue per current plan of care.      Pain  None/denied  Therapy/Group: Individual Therapy  Patty Sermons 12/07/2021, 8:01 PM

## 2021-12-07 NOTE — Progress Notes (Signed)
Physical Therapy Session Note  Patient Details  Name: Kevin [name] MRN: [id number] Date of Birth: 05/24/1933  Today's Date: 12/07/2021 PT Individual Time: 0915-1027 PT Individual Time Calculation (min): 72 min   Short Term Goals: Week 1:  PT Short Term Goal 1 (Week 1): = to LTG's due to ELOS  Skilled Therapeutic Interventions/Progress Updates:      Pt sitting in w/c to start - agreeable to PT tx. Denies pain. Transported in w/c to main rehab gym.  Gait training using RW ambulating > 150ft with CGA - min cues for awareness of L foot placement as he tends to drag it or have inadequate step length when he's distracted or fatigued. Cues also for upright posture due to flexed trunk - corrects with cues. Upgraded task for item retrieval while ambulating - locating sticky notes in hallways that were labeled #1-#10 - min cues needed for locating and CGA for safety while ambulating with RW - working on head turns, safety approach, and functional reaching outside BOS. Similar cues as above for L foot and postural awareness. Ambulating >200ft during sequencing task.   Standing LE there-ex in // bars using mirror for visual feedback. 2.5# ankle weight to LLE for strengthening.  -1x15 alternating high knees focusing on stance control for loading leg, bilaterally -1x15 hip abduction bilaterally -1x15 hamstring curls, bilaterally - decreased ROM and increased difficulty on L, weak hamstrings -2x20 heel raises - decreased ROM on L, weak PF -Standing ball taps with unilateral UE support with close SBA for safety - pt with decreased hip/ankle strategies for reaching outside BOS. Pt attempting to let go of // bars with both hands and increased instability and insight into balance deficits.   Returned to room and patient remained seated in w/c with brakes locked, alarm on, all needs met.    Therapy Documentation Precautions:  Precautions Precautions: Fall Precaution Comments: L  hemiparesis Restrictions Weight Bearing Restrictions: No General:     Therapy/Group: Individual Therapy  Christian P Manhard 12/07/2021, 7:46 AM  

## 2021-12-08 NOTE — Progress Notes (Signed)
Physical Therapy Session Note  Patient Details  Name: Kevin [name] MRN: [id number] Date of Birth: 11/14/1933  Today's Date: 12/08/2021 PT Individual Time: 1100-1130 PT Individual Time Calculation (min): 30 min   Short Term Goals: Week 1:  PT Short Term Goal 1 (Week 1): = to LTG's due to ELOS  Skilled Therapeutic Interventions/Progress Updates:    Presents up in w/c and agresable to session.   Engaged in functional gait in hallway to and from therapy x 150' with focus on L heel strike, upright posture, decreased reliance on RW with UE support, and safe management of RW in distracting environment with CGA/close supervision at times. CGA needed for turns and backwards walking.   NMR on Nustep for reciprocal movement pattern retraining with BUE/BLE x 5 min and then with BLE only x 4 min on level 6. Pt had discussed wanting to get back on his bike in AM session, discussed how this would be unsafe at this time but demonstrated how something like nustep or recumbent bike could be a good option as well.   Returned to room as described above including pathfinding in hallway to locate room - easily distracted and passed by room initially. Left in w/c with all needs in reach and safety belt donned.   Therapy Documentation Precautions:  Precautions Precautions: Fall Precaution Comments: L hemiparesis Restrictions Weight Bearing Restrictions: No   Pain:  Denies pain.    Therapy/Group: Individual Therapy  Canary Brim Ivory Broad, PT, DPT, CBIS  12/08/2021, 12:20 PM

## 2021-12-08 NOTE — Progress Notes (Signed)
Occupational Therapy Session Note  Patient Details  Name: Kevin [name] MRN: [id number] Date of Birth: [birth year]  Today's Date: 12/08/2021 OT Individual Time: 1000-1055 OT Individual Time Calculation (min): 55 min    Short Term Goals: Week 1:  OT Short Term Goal 1 (Week 1): Pt will increase sit to stand from standard height seat to Min A with use of RW while receiving 1 cue if needed for technique. Week 2:     Skilled Therapeutic Interventions/Progress Updates:    Pt received in the recliner. Pt trying to don belt; assisted pt into standing position to better access belt loops with min A for balance. Pt with difficulty sustaining left knee terminal extension but with tactile cues pt able to correct self. Pt performed ambulation to the gym with RW with contact guard with cues for sustaining upright posture. Outside the gym focus on ambulating without RW with focus on weight shifts to clear foot while maintaining upright posture (core and hip strengthening and control) with min A with max multimodal cues. Pt ambulated back and forth the green line 30 feet 4 times. REtunred to room with the RW with contact guard. Assisted the pt with shaving at the sink - total A (pt usually using an electric razor but not here). Pt left sitting up in the wc with safety belt and call bell.     Therapy Documentation Precautions:  Precautions Precautions: Fall Precaution Comments: L hemiparesis Restrictions Weight Bearing Restrictions: No  Pain:   Other Treatments:     Therapy/Group: Individual Therapy  Willeen Cass [facility] 12/08/2021, 1:20 PM
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
## 2021-12-08 NOTE — Progress Notes (Signed)
PROGRESS NOTE   Subjective/Complaints:   No issues overnite , in PT amb with CGA, unsteady gait  Review of Systems  Constitutional:  Negative for chills, fever and malaise/fatigue.  HENT:  Negative for congestion.   Eyes:  Negative for double vision.  Respiratory:  Negative for cough and shortness of breath.   Cardiovascular:  Negative for chest pain.  Gastrointestinal:  Positive for constipation.  Genitourinary: Negative.   Musculoskeletal: Negative.   Neurological:  Positive for focal weakness.    Objective:   No results found. No results for input(s): "WBC", "HGB", "HCT", "PLT" in the last 72 hours.  No results for input(s): "NA", "K", "CL", "CO2", "GLUCOSE", "BUN", "CREATININE", "CALCIUM" in the last 72 hours.   Intake/Output Summary (Last 24 hours) at 12/08/2021 0857 Last data filed at 12/08/2021 0848 Gross per 24 hour  Intake 1068 ml  Output 1150 ml  Net -82 ml         Physical Exam: Vital Signs Blood pressure 124/63, pulse 78, temperature 98.4 F (36.9 C), resp. rate 18, height 6\' 2"  (1.88 m), weight 72.5 kg, SpO2 100 %.    General: No acute distress Mood and affect are appropriate Heart: Regular rate and rhythm no rubs murmurs or extra sounds Lungs: Clear to auscultation, breathing unlabored, no rales or wheezes Abdomen: Positive bowel sounds, soft nontender to palpation, nondistended Extremities: No clubbing, cyanosis, or edema  Skin: warm and dry Neuro:  Alert and oriented to person, place and situation. Not oriented to date. Follows commands. Can name and repeat.  Slightly slow processing. CN 2-12 intact.  5/5 in right and 4+/5 left delt , bi, tri, grip, HF, KE, ADF No evidence of dysdiadochokinesis with Rapid alt sup pronation of both forearms  Intact finger to thumb opposition     Assessment/Plan: 1. Functional deficits which require 3+ hours per day of interdisciplinary therapy in a  comprehensive inpatient rehab setting. Physiatrist is providing close team supervision and 24 hour management of active medical problems listed below. Physiatrist and rehab team continue to assess barriers to discharge/monitor patient progress toward functional and medical goals  Care Tool:  Bathing    Body parts bathed by patient: Right arm, Left arm, Chest, Front perineal area, Abdomen, Right upper leg, Left upper leg, Face, Right lower leg, Left lower leg, Buttocks   Body parts bathed by helper: Buttocks     Bathing assist Assist Level: Contact Guard/Touching assist     Upper Body Dressing/Undressing Upper body dressing   What is the patient wearing?: Pull over shirt    Upper body assist Assist Level: Set up assist    Lower Body Dressing/Undressing Lower body dressing      What is the patient wearing?: Underwear/pull up, Pants     Lower body assist Assist for lower body dressing: Contact Guard/Touching assist     Toileting Toileting    Toileting assist Assist for toileting: Independent with assistive device Assistive Device Comment: urinal   Transfers Chair/bed transfer  Transfers assist     Chair/bed transfer assist level: Minimal Assistance - Patient > 75%     Locomotion Ambulation   Ambulation assist      Assist  level: Moderate Assistance - Patient 50 - 74% Assistive device: Hand held assist Max distance: 46 ft   Walk 10 feet activity   Assist     Assist level: Moderate Assistance - Patient - 50 - 74% Assistive device: Hand held assist   Walk 50 feet activity   Assist Walk 50 feet with 2 turns activity did not occur: Safety/medical concerns         Walk 150 feet activity   Assist Walk 150 feet activity did not occur: Safety/medical concerns         Walk 10 feet on uneven surface  activity   Assist Walk 10 feet on uneven surfaces activity did not occur: Safety/medical concerns         Wheelchair     Assist Is  the patient using a wheelchair?: Yes (transported pt to and from gym for time management) Type of Wheelchair: Manual    Wheelchair assist level: Dependent - Patient 0% Max wheelchair distance: 115ft    Wheelchair 50 feet with 2 turns activity    Assist        Assist Level: Dependent - Patient 0%   Wheelchair 150 feet activity     Assist      Assist Level: Dependent - Patient 0%   Blood pressure 124/63, pulse 78, temperature 98.4 F (36.9 C), resp. rate 18, height 6\' 2"  (1.88 m), weight 72.5 kg, SpO2 100 %.  Medical Problem List and Plan: 1. Functional deficits secondary to right ischemic thalamic infarction             -patient may shower             -ELOS/Goals: 7-10 days, Min A with PT/OT, sup with SLP             -Continue CIR with PT/OT/SLP 2.  Antithrombotics: -DVT/anticoagulation:  Pharmaceutical: Heparin- switch to lovenox, pt has nl renal fxn             -antiplatelet therapy: Aspirin 81 mg daily and Plavix 75 mg daily x3 weeks (12/17/21) then aspirin alone 3. Pain Management: Tylenol as needed 4. Mood/Behavior/Sleep: Provide emotional support             -antipsychotic agents: N/A 5. Neuropsych/cognition: This patient is capable of making decisions on his own behalf. 6. Skin/Wound Care: Routine skin checks 7. Fluids/Electrolytes/Nutrition: Routine in and outs with follow-up chemistries 8.  Hypertension.  Norvasc 10 mg daily, hydralazine 25 mg every 8 hours.  Monitor with increased mobility. Avoid hypotension.    Vitals:   12/07/21 2317 12/08/21 0509  BP: (!) 169/86 124/63  Pulse: 69 78  Resp: 20 18  Temp:  98.4 F (36.9 C)  SpO2: 99% 100%    9.  Hyperlipidemia.  Lipitor. Heart healthy diet 10.  CAD/nonobstructive.  Continue aspirin Plavix.  No complaints of chest pain 11.  Constipation.  MiraLAX twice daily, Senokot nightly.             -  -10/7 large type 4 BM, increase senna to BID  12. Poor dentition             -Dys 3 diet 13. Anemia-  mild             -HGB down a little to 10.9, recheck monday 14. Azotemia    -10/7 Bun down to 22, Cr 0.87 improved, continue to monitor     LOS: 6 days A FACE TO FACE EVALUATION WAS PERFORMED  12/7 12/08/2021,  8:57 AM

## 2021-12-08 NOTE — Progress Notes (Signed)
Occupational Therapy Session Note  Patient Details  Name: Kevin [name] MRN: [id number] Date of Birth: 02/07/1934  Today's Date: 12/08/2021 OT Individual Time: 3716-9678 OT Individual Time Calculation (min): 55 min    Short Term Goals: Week 1:  OT Short Term Goal 1 (Week 1): Pt will increase sit to stand from standard height seat to Min A with use of RW while receiving 1 cue if needed for technique.  Skilled Therapeutic Interventions/Progress Updates:  Pt greeted seated in recliner, pt agreeable to OT intervention. Session focus on BADL reeducation, functional mobility, dynamic standing balance and decreasing overall caregiver burden.      Pt presenting with impaired memory this AM asking whether or not he had clothes even though we went over where his clothes were yesterday. Pt also declining to wear a shirt without an undershirt even though pt wore the same type of shirt yesterday, pt also asking whether or not this therapist bought his new clothes even though pt aware yesterday that his daughter bought new clothes.  Pt completed dressing from recliner with set- up for UB dressing, and CGA for LB dressing needing balance assist to pull pants up to waist line, pt continues to state" I got it" even though pt losing balance posteriorly. Pt stated " I am just swaying like a tree."       Pt completed functional ambulation to sink with RW and cGA pt stood at sink with CGA to use mouth wash and complete hair brushing. Pt completed task with no UE support with no LOB.  Pt completed functional mobility to<>from gym with Rw and CGA. Worked on dynamic standing balance with pt engaging in horse shoes therapeutic activity with pt standing with Rw and unilateral support. Pt preferring to stand in tandem stance as pt reports :"this is how you play horseshoes." Education provided on standing at hip distance BOS to improve balance support. Graded task up and had pt complete dynamic reaching task with no UE support  with no LOB to simulate LB dressing as pt with tendency to lean backwards during LB dressing tasks. Pt completed functional ambulation to room with Rw and CGA- MINA. Pt handed off directly to PT.   Therapy Documentation Precautions:  Precautions Precautions: Fall Precaution Comments: L hemiparesis Restrictions Weight Bearing Restrictions: No  Pain: Unrated pain in R hand, reporting feeling "tingling", rest breaks provided as needed.   Therapy/Group: Individual Therapy  [name] 12/08/2021, 12:11 PM

## 2021-12-08 NOTE — Progress Notes (Signed)
Physical Therapy Session Note  Patient Details  Name: Kevin [name] MRN: [id number] Date of Birth: 1933-08-09  Today's Date: 12/08/2021 PT Individual Time: 0900-0946 PT Individual Time Calculation (min): 46 min   Short Term Goals: Week 1:  PT Short Term Goal 1 (Week 1): = to LTG's due to ELOS  Skilled Therapeutic Interventions/Progress Updates:    Pt presents up in chair and ready for therapy session. Focused on NMR to address balance, fall risk assessment, functional dynamic gait, and postural control retraining with and without UE support. Sit > stands with RW with CGA with cues for anterior weightshift and technique. Functional gait on unit with RW with CGA x 150' x 2 with cues for upright posture, safe positioning of RW, and attention to L foot clearance (decreased as fatigued). .  Administered Berg Balance assessment (see results below) and discussed with patient.  Pt demonstrates high fall risk and decreased insight into balance deficits and how they relate functionally. Several times throughout session pt attempting to "try" things in less than safe manner.  Blocked practice NMR for sit <> Stands without AD with focus on anterior weightshift and activation through BLE decreasing reliance on UE's as well as pushing through mat on backs of knees. Pt demonstrates difficulty with carryover of task. Requires min to mod assist to fully facilitate when taking away back of mat on knees.   Dynamic gait in room including backwards walking and sidestepping with RW with CGA and cues for safe positioning of RW (and use of it).  Therapy Documentation Precautions:  Precautions Precautions: Fall Precaution Comments: L hemiparesis Restrictions Weight Bearing Restrictions: No  Pain: Pain Assessment Pain Scale: 0-10 Pain Score: 0-No pain    Balance: Standardized Balance Assessment Standardized Balance Assessment: Berg Balance Test Berg Balance Test Sit to Stand: Needs minimal aid to stand or  to stabilize Standing Unsupported: Able to stand 30 seconds unsupported Sitting with Back Unsupported but Feet Supported on Floor or Stool: Able to sit safely and securely 2 minutes Stand to Sit: Controls descent by using hands Transfers: Needs one person to assist Standing Unsupported with Eyes Closed: Able to stand 10 seconds with supervision Standing Ubsupported with Feet Together: Needs help to attain position but able to stand for 30 seconds with feet together From Standing, Reach Forward with Outstretched Arm: Reaches forward but needs supervision From Standing Position, Pick up Object from Floor: Unable to try/needs assist to keep balance (able to pick up with min assist) From Standing Position, Turn to Look Behind Over each Shoulder: Turn sideways only but maintains balance Turn 360 Degrees: Needs assistance while turning Standing Unsupported, Alternately Place Feet on Step/Stool: Needs assistance to keep from falling or unable to try Standing Unsupported, One Foot in Front: Loses balance while stepping or standing Standing on One Leg: Unable to try or needs assist to prevent fall Total Score: 18     Therapy/Group: Individual Therapy  Canary Brim Ivory Broad, PT, DPT, CBIS  12/08/2021, 10:23 AM

## 2021-12-09 MED ORDER — TRAZODONE HCL 50 MG PO TABS: 50.0000 mg | ORAL_TABLET | Freq: Every evening | ORAL | Status: DC | PRN

## 2021-12-09 NOTE — Plan of Care (Signed)
  Problem: RH Problem Solving Goal: LTG Patient will demonstrate problem solving for (SLP) Description: LTG:  Patient will demonstrate problem solving for basic/complex daily situations with cues  (SLP) Flowsheets (Taken 12/09/2021 1308) LTG Patient will demonstrate problem solving for: Moderate Assistance - Patient 50 - 74% Note: Goal downgraded due to slow progress and decreased mental flexibility.    Problem: RH Memory Goal: LTG Patient will use memory compensatory aids to (SLP) Description: LTG:  Patient will use memory compensatory aids to recall biographical/new, daily complex information with cues (SLP) Flowsheets (Taken 12/09/2021 1308) LTG: Patient will use memory compensatory aids to (SLP): Moderate Assistance - Patient 50 - 74% Note: Goal downgraded due to slow progress and decreased mental flexibility.    Problem: RH Awareness Goal: LTG: Patient will demonstrate awareness during functional activites type of (SLP) Description: LTG: Patient will demonstrate awareness during functional activites type of (SLP) Note: Goal downgraded due to slow progress and decreased mental flexibility.

## 2021-12-09 NOTE — Patient Care Conference (Signed)
Inpatient RehabilitationTeam Conference and Plan of Care Update Date: 12/09/2021   Time: 10:48 AM    Patient Name: Kevin [name]      Medical Record Number: [id number]  Date of Birth: 1934-01-04 Sex: Male         Room/Bed: 4W18C/4W18C-01 Payor Info: Payor: [employer] MEDICARE / Plan: [employer] MEDICARE HMO / Product Type: *No Product type* /    Admit Date/Time:  12/02/2021  1:06 PM  Primary Diagnosis:  Right thalamic infarction [facility])  Hospital Problems: Principal Problem:   Right thalamic infarction [facility])    Expected Discharge Date: Expected Discharge Date: 12/14/21  Team Members Present: Physician leading conference: Dr. Claudette Laws Social Worker Present: Lavera Guise, BSW Nurse Present: Chana Bode, RN PT Present: Ralph Leyden, PT OT Present: [name], COTA;Jennifer [place], OT SLP Present: Eilene Ghazi, SLP PPS Coordinator present : Fae Pippin, SLP     Current Status/Progress Goal Weekly Team Focus  Bowel/Bladder     Continent of bowel and bladder        Swallow/Nutrition/ Hydration             ADL's   CGA for bathing, set- up for UB dressing, CGA for LB dressing, CGA for ambulatory transfers with Rw, CGA for 3/3 toileting tasks. pt continues to present with impaired balance although reports he has no balance deficits.  supervision goals  dynamic balance, BADL reeducation, functional mobility   Mobility   up to 143ft amb with RW and CGA (needs cuing for posture and L foot clearance), min assist sit to stand , stairs (6in) x 4 with min assist, SPV bed mobility, Berg score 18/56 (high fall risk)  SPV/SBA at an ambulatory level with LRAD  posture, static and dynamic balance, transfers, gait training.   Communication             Safety/Cognition/ Behavioral Observations  max A  sup-to-min A  problem solving, recall, awareness, medication/money management tasks   Pain     N/a        Skin     N/a          Discharge Planning:  Discharging home with  daughter to assist. 1 level home 24/7   Team Discussion: Patient not sleeping well; MD added medication.  Patient on target to meet rehab goals: yes, currently need CGA  for bathing and dressing and toileting. Progress limited by impaired balance and needs cues for posture. Patient with memory issues and dementia and needs max assist for basic cognitive tasks.  Limited insight into deficits. Goals for discharge set for supervision overall.  *See Care Plan and progress notes for long and short-term goals.   Revisions to Treatment Plan:  N/a  Teaching Needs: Safety, toileting, transfers, medication management/$ management and basic cognitive tasks, secondary risk management, etc.  Current Barriers to Discharge: Decreased caregiver support  Possible Resolutions to Barriers: Family education HH follow up services DME: shower chair     Medical Summary Current Status: poor cognition, poor awareness of deficits  Barriers to Discharge: Medical stability   Possible Resolutions to Barriers/Weekly Focus: will need family ed given worsening of cognitive deficits   Continued Need for Acute Rehabilitation Level of Care: The patient requires daily medical management by a physician with specialized training in physical medicine and rehabilitation for the following reasons: Direction of a multidisciplinary physical rehabilitation program to maximize functional independence : Yes Medical management of patient stability for increased activity during participation in an intensive rehabilitation regime.: Yes  Analysis of laboratory values and/or radiology reports with any subsequent need for medication adjustment and/or medical intervention. : Yes   I attest that I was present, lead the team conference, and concur with the assessment and plan of the team.   Dorien Chihuahua B 12/09/2021, 3:13 PM

## 2021-12-09 NOTE — Progress Notes (Signed)
Physical Therapy Session Note  Patient Details  Name: Kevin [name] MRN: [id number] Date of Birth: [birth year]  Today's Date: 12/09/2021 PT Individual Time: 0915-1000, 1030-1100 PT Individual Time Calculation (min): 45 min, 30 min   Short Term Goals: Week 1:  PT Short Term Goal 1 (Week 1): = to LTG's due to ELOS  Skilled Therapeutic Interventions/Progress Updates:    Session 1: Pt received as hand off from OT seated in w/c. Pt transported to therapy gym for time management and energy conservation. Session focused on stair navigation and gait. Pt navigated 6" steps x 4 with B hand rails and min A for balance. Pt self selects alternating ascending and step to while descending. Pt then directed in "step up plus"  with BUE support on hand rails to improve coordination and single leg stance stability for improved alternating step navigation. After 3 x 10, pt was able to navigate  6" steps x 12 with CGA. Pt then ambulated back to room with cues for RW proximity and occ L foot clearance with fatigue. Pt sat in recliner and remained with family present, alarm active, and all needs in reach.   Session 2: Therapist returned to complete missed minutes d/t scheduling error. Pt resting in recliner, No complaint of pain. Session focused on ambulation in community environment. Pt ambulated from room to [facility] entrance with CGA overall. Cues for RW proximity and L foot clearance, especially when pt is distracted. Pt reports he was not fatigued by this activity. Pt then navigated outdoor steps 2 x 4 without rest break with 1 handrail, 1 set with each hand. Pt required using both hands on rail when stepping up with L foot but able to perform when stepping up with R. Pt utilized self selected step to pattern, alternating feet. Discussed ascending with stronger foot, but pt safe when using hand rail for support. Pt returned to unit in same manner and was handed off to SLP in hallway.   Therapy Documentation Precautions:   Precautions Precautions: Fall Precaution Comments: L hemiparesis Restrictions Weight Bearing Restrictions: No General:       Therapy/Group: Individual Therapy  Mickel Fuchs 12/09/2021, 11:08 AM

## 2021-12-09 NOTE — Progress Notes (Signed)
Physical Therapy Session Note  Patient Details  Name: Kevin [name] MRN: [id number] Date of Birth: [birth year]  Today's Date: 12/09/2021 PT Individual Time: 1400-1503 PT Individual Time Calculation (min): 63 min   Short Term Goals: Week 1:  PT Short Term Goal 1 (Week 1): = to LTG's due to ELOS  Skilled Therapeutic Interventions/Progress Updates:  Patient seated upright in recliner on entrance to room. Patient alert and agreeable to PT session.   Patient with no pain complaint at start of session.  Therapeutic Activity: Transfers: Pt performed sit<>stand from recliner to RW with ModA. Pt unable to adequately produce forward lean and pushes up to stand with COM behind BOS requiring repositioning of feet posteriorly throughout rise to stand. Provided vc/tc for forward scoot, anterior weight shift and ModA for power up. Following Nmr, Pt improves to CGA by end of session.   Ambulatory transfer to bathroom to urinate and pt discards RW to L and holds to exposed pipe behind toilet to steady self. Pt guided in repositioning of RW over toilet to provide consistent UE support throughout. Ambulation back to sink to wash hands with CGA/ supervision.   Gait Training:  Pt ambulated 175' x2 using RW with CGA/ close supervision. Demonstrated flexed forward posture, slow pace. Provided vc/ tc for upright posture, level gaze, decreased push into RW.  Neuromuscular Re-ed: NMR facilitated during session with focus on standing balance and proprioception. Pt guided in improving sit<>stand technique with breakdown of anterior weight shift. Pt guided in rise to stand with hands placed on elevated mat table. Educated with verbal instructions and visual demonstration on biomechanics of rise to stand. Guided pt in forward scoot, posterior foot placement and forward hand placement. modA to stand d/t pt's long legs and shorter torso requiring R hand placement on armrest to assist with forward push during power up. L  hand on mat table and MinA for rise to stand. Stance in modified plantigrade position to promote more forward lean, improved confidence in position, and stretch to calves. Blocked practice throughout session x7 in modified platigrade and x5 without use of mat table.  Rise to stand with no UE support and guided in lateral weight shift to promote improved foot clearance.  NMR performed for improvements in motor control and coordination, balance, sequencing, judgement, and self confidence/ efficacy in performing all aspects of mobility at highest level of independence.   Patient seated in recliner at end of session with brakes locked, belt alarm set, and all needs within reach.   Therapy Documentation Precautions:  Precautions Precautions: Fall Precaution Comments: L hemiparesis Restrictions Weight Bearing Restrictions: No General:   Vital Signs: Therapy Vitals Temp: 98.7 F (37.1 C) Pulse Rate: 86 Resp: 16 BP: (!) 156/64 Patient Position (if appropriate): Sitting Oxygen Therapy SpO2: 100 % O2 Device: Room Air Pain:  No complaint of pain this session.   Therapy/Group: Individual Therapy  [name] PT, DPT, CSRS 12/09/2021, 5:17 PM

## 2021-12-09 NOTE — Progress Notes (Signed)
Occupational Therapy Session Note  Patient Details  Name: Kevin [name] MRN: [id number] Date of Birth: 1933-04-28  Today's Date: 12/09/2021 OT Individual Time: 8657-8469 session 1 OT Individual Time Calculation (min): 73 min  Session 2: 6295-2841   Short Term Goals: Week 1:  OT Short Term Goal 1 (Week 1): Pt will increase sit to stand from standard height seat to Min A with use of RW while receiving 1 cue if needed for technique.  Skilled Therapeutic Interventions/Progress Updates:  Session 1: Pt greeted supine in bed, pt reporting fatigue but agreeable to OT intervention. Session focus on BADL reeducation, functional mobility, dynamic standing balance and decreasing overall caregiver burden.                                 Pt completed supine>sit with supervision, pt needed + assist standing today needing bed to be elevated at pt reports being "stiff." Pt completed functional ambulation into bathroom with RW with MIN A for + safety. Pt entered shower with use of grab and CGA. Pt continues to have decreased awareness into deficits stating " I'll be all good when I get home because I can ride my bicycle." Pt also asking whether or not he has clothes when we have gotten clothes out of the same drawer for the past 2 days with education provided that his daughter had brought him clothes to wear.  Pt completed bathing with overall supervision from shower seat.pt exited shower with CGA to sit in w/c with use of grab bars. Pt completed dressing from w/c. Set- up for UB dressing, CGA for LB dressing, pt able to don socks today but needed MIN A to don shoes. Pt completed seated oral care at sink with MIN A, as pt with no recall of using this in the past even though we used it yesterday.  Education provided on pts health resource notebook and importance of using it to keep track of important documents.  Total A transport outside as pt reports needing "some fresh air." Worked on functional ambulation outside  across uneven surfaces with Rw, pt completed functional mobility greater than a household distance with Rw and CGA. Pt transported back to unit in w/c with total A. Pt handed off to PT for next session.   Session 2: pt greeted seated in recliner, finishing lunch, daughter present therefore provided education on pts current level of assist being CGA. Education provided on pts ADL participation and current balance impairments. Recommended 24/ 7 supervision at all times at home and walking with RW, daughter reports memory deficits are baseline, recommended to daughter that pt have 24/7 supervision for all med mgmt tasks. Recommended daughters come in over the weekend to complete family ed to ensure family can accommodate pts level of assist, daughter to discuss with rest of family.  Pt completed functional ambulation down to gym with Rw and CGA, pt completed standing tolerance/dynamic balance task with pt instructed to stand at table with no UE support and complete peg board task with LUE. Pt instructed to create same structure indicated in visual aid, pt was noted to mix up pattern with pt starting with Red peg vs blue peg, once pt aware of mistake pt reports " you can just turn the whole board around." Pt completed task with CGA with no UE support and no LOB. Pt transported back to room in w/c with total A where pt left up in recliner  with all needs within reach and safety belt activated.   Therapy Documentation Precautions:  Precautions Precautions: Fall Precaution Comments: L hemiparesis Restrictions Weight Bearing Restrictions: No  Pain: no pain reported during either session  Therapy/Group: Individual Therapy  Corinne Ports [facility] 12/09/2021, 10:51 AM

## 2021-12-09 NOTE — Progress Notes (Signed)
Patient ID: Kevin [name], male   DOB: 1933-04-13, 88 y.o.   MRN: [id number]  Shower chair ordered through [employer]

## 2021-12-09 NOTE — Progress Notes (Signed)
Patient ID: Kevin [name], male   DOB: Nov 22, 1933, 88 y.o.   MRN: [id number] Team Conference Report to Patient/Family  Team Conference discussion was reviewed with the patient and caregiver, including goals, any changes in plan of care and target discharge date.  Patient and caregiver express understanding and are in agreement.  The patient has a target discharge date of 12/14/21.  SW met with patient daughter and provided team conference updates. Daughter will discuss family education Fri-Sun and call sw to schedule. No additional questions or concerns, sw will continue to follow up. Kevin [name] 12/09/2021, 1:18 PM

## 2021-12-09 NOTE — Discharge Summary (Signed)
Physician Discharge Summary  Patient ID: Kevin [name] MRN: [id number] DOB/AGE: 86-20-1935 86 y.o.  Admit date: 12/02/2021 Discharge date: 12/14/2021  Discharge Diagnoses:  Principal Problem:   Right thalamic infarction [facility]) DVT prophylaxis Hypertension Hyperlipidemia CAD/nonobstructive Constipation Azotemia Chronic anemia  Discharged Condition: Stable  Significant Diagnostic Studies: ECHOCARDIOGRAM COMPLETE  Result Date: 11/26/2021    ECHOCARDIOGRAM REPORT   Patient Name:   Kevin [name] [facility] Date of Exam: 11/26/2021 Medical Rec #:  [id number]     Height:       74.0 in Accession #:    [id number]    Weight:       190.0 lb Date of Birth:  [birth year]      BSA:          2.127 m Patient Age:    88 years      BP:           150/71 mmHg Patient Gender: M             HR:           75 bpm. Exam Location:  [facility] Procedure: 2D Echo, Color Doppler, Cardiac Doppler and Saline Contrast Bubble            Study Indications:     I63.9 Stroke  History:         Patient has no prior history of Echocardiogram examinations.                  CAD; Risk Factors:Hypertension.  Sonographer:     Humphrey Rolls Referring Phys:  [id number] SARA-MAIZ A THOMAS Diagnosing Phys: Alwyn Pea MD IMPRESSIONS  1. Left ventricular ejection fraction, by estimation, is 70 to 75%. The left ventricle has hyperdynamic function. The left ventricle has no regional wall motion abnormalities. Left ventricular diastolic parameters were normal.  2. Right ventricular systolic function is normal. The right ventricular size is normal.  3. The mitral valve is normal in structure. Trivial mitral valve regurgitation.  4. The aortic valve is normal in structure. Aortic valve regurgitation is not visualized. FINDINGS  Left Ventricle: Left ventricular ejection fraction, by estimation, is 70 to 75%. The left ventricle has hyperdynamic function. The left ventricle has no regional wall motion abnormalities. The left ventricular internal cavity size was normal in  size. There is no left ventricular hypertrophy. Left ventricular diastolic parameters were normal. Right Ventricle: The right ventricular size is normal. No increase in right ventricular wall thickness. Right ventricular systolic function is normal. Left Atrium: Left atrial size was normal in size. Right Atrium: Right atrial size was normal in size. Pericardium: There is no evidence of pericardial effusion. Mitral Valve: The mitral valve is normal in structure. Trivial mitral valve regurgitation. Tricuspid Valve: The tricuspid valve is normal in structure. Tricuspid valve regurgitation is trivial. Aortic Valve: The aortic valve is normal in structure. Aortic valve regurgitation is not visualized. Aortic valve mean gradient measures 5.0 mmHg. Aortic valve peak gradient measures 9.5 mmHg. Aortic valve area, by VTI measures 1.96 cm. Pulmonic Valve: The pulmonic valve was normal in structure. Pulmonic valve regurgitation is not visualized. Aorta: The ascending aorta was not well visualized. IAS/Shunts: No atrial level shunt detected by color flow Doppler. Agitated saline contrast was given intravenously to evaluate for intracardiac shunting.  LEFT VENTRICLE PLAX 2D LVIDd:         4.20 cm   Diastology LVIDs:         2.20 cm   LV e' medial:  8.92 cm/s LV PW:         1.20 cm   LV E/e' medial:  9.0 LV IVS:        1.00 cm   LV e' lateral:   13.50 cm/s LVOT diam:     1.80 cm   LV E/e' lateral: 5.9 LV SV:         60 LV SV Index:   28 LVOT Area:     2.54 cm  RIGHT VENTRICLE RV Basal diam:  4.20 cm RV S prime:     18.70 cm/s TAPSE (M-mode): 2.8 cm LEFT ATRIUM             Index        RIGHT ATRIUM           Index LA diam:        3.70 cm 1.74 cm/m   RA Area:     16.40 cm LA Vol (A2C):   72.9 ml 34.28 ml/m  RA Volume:   44.30 ml  20.83 ml/m LA Vol (A4C):   85.4 ml 40.15 ml/m LA Biplane Vol: 80.2 ml 37.71 ml/m  AORTIC VALVE                     PULMONIC VALVE AV Area (Vmax):    1.95 cm      PV Vmax:       1.25 m/s AV Area  (Vmean):   1.89 cm      PV Peak grad:  6.2 mmHg AV Area (VTI):     1.96 cm AV Vmax:           154.00 cm/s AV Vmean:          105.000 cm/s AV VTI:            0.306 m AV Peak Grad:      9.5 mmHg AV Mean Grad:      5.0 mmHg LVOT Vmax:         118.00 cm/s LVOT Vmean:        78.000 cm/s LVOT VTI:          0.236 m LVOT/AV VTI ratio: 0.77  AORTA Ao Root diam: 3.30 cm MITRAL VALVE               TRICUSPID VALVE MV Area (PHT): 3.19 cm    TR Peak grad:   30.0 mmHg MV Decel Time: 238 msec    TR Vmax:        274.00 cm/s MV E velocity: 80.20 cm/s MV A velocity: 79.70 cm/s  SHUNTS MV E/A ratio:  1.01        Systemic VTI:  0.24 m                            Systemic Diam: 1.80 cm Alwyn Pea MD Electronically signed by Alwyn Pea MD Signature Date/Time: 11/26/2021/5:38:16 PM    Final    EEG adult  Result Date: 11/26/2021 Jefferson Fuel, MD     11/26/2021  3:48 PM Routine EEG Report [name] is a 88 y.o. male with a history of altered mental status who is undergoing an EEG to evaluate for seizures. Report: This EEG was acquired with electrodes placed according to the International 10-20 electrode system (including Fp1, Fp2, F3, F4, C3, C4, P3, P4, O1, O2, T3, T4, T5, T6, A1, A2, Fz, Cz, Pz). The following electrodes were missing or  displaced: none. The occipital dominant rhythm was 9 Hz. This activity is reactive to stimulation. Drowsiness was manifested by background fragmentation; deeper stages of sleep were identified by K complexes and sleep spindles. There was no focal slowing. There were no interictal epileptiform discharges. There were no electrographic seizures identified. Photic stimulation and hyperventilation were not performed. Impression: This EEG was obtained while awake and asleep and is normal.   Clinical Correlation: Normal EEGs, however, do not rule out epilepsy. [name], MD [facility] [phone] If 7pm- 7am, please page neurology on call as listed in [place].   MR  CERVICAL SPINE W WO CONTRAST  Result Date: 11/25/2021 CLINICAL DATA:  Leg weakness, ataxia EXAM: MRI CERVICAL SPINE WITHOUT AND WITH CONTRAST TECHNIQUE: Multiplanar and multiecho pulse sequences of the cervical spine, to include the craniocervical junction and cervicothoracic junction, were obtained without and with intravenous contrast. CONTRAST:  80 mL Vueway IV COMPARISON:  None Available. FINDINGS: Alignment: 4.4 mm anterolisthesis C3-4. 4.7 mm anterolisthesis C4-5. 3.7 mm retrolisthesis C6-7 Vertebrae: Negative for fracture or mass. Cord: Cord evaluation limited by mild motion. No cord lesion identified. Posterior Fossa, vertebral arteries, paraspinal tissues: Negative for paraspinous mass or fluid collection Disc levels: C2-3: Disc degeneration and spurring. Bilateral facet hypertrophy. Right foramen patent. Mild to moderate left foraminal stenosis. C3-4: Anterolisthesis with asymmetric facet degeneration on the left. Right foramen patent. Severe left foraminal stenosis. Central canal patent C4-5: Anterolisthesis. Disc and facet degeneration. Moderate left foraminal narrowing. Right foramen patent C5-6: Disc degeneration with diffuse endplate spurring. Cord flattening with borderline central canal stenosis. Mild foraminal narrowing bilaterally C6-7: Disc degeneration with diffuse uncinate spurring. Moderate foraminal stenosis bilaterally C7-T1: Disc and facet degeneration. Mild to moderate foraminal stenosis bilaterally. IMPRESSION: 1. Multilevel cervical spondylosis. Multilevel foraminal stenosis due to spurring as described above. 2. Motion degraded study. No cord lesion identified. Electronically Signed   By: Franchot Gallo M.D.   On: 11/25/2021 17:54   MR THORACIC SPINE W WO CONTRAST  Result Date: 11/25/2021 CLINICAL DATA:  Leg weakness and ataxia.  Left-sided numbness EXAM: MRI THORACIC WITHOUT AND WITH CONTRAST TECHNIQUE: Multiplanar and multiecho pulse sequences of the thoracic spine were obtained  without and with intravenous contrast. CONTRAST:  8 mL Vueway IV COMPARISON:  None Available. FINDINGS: Alignment:  Normal Vertebrae: Negative for fracture or mass. Schmorl's node superior endplate of X51 with edema and enhancement. Cord:  Normal signal and morphology Paraspinal and other soft tissues: Bilateral renal cysts. Largest cyst on the left 5.7 cm. Disc levels: Mild thoracic disc degeneration with disc space narrowing. Disc and facet degeneration at T11-12. No significant spinal stenosis. IMPRESSION: Mild thoracic degenerative change most prominent at T10-11. No significant stenosis Negative for thoracic fracture or mass lesion.  None no Electronically Signed   By: Franchot Gallo M.D.   On: 11/25/2021 14:39   MR Lumbar Spine W Wo Contrast  Result Date: 11/25/2021 CLINICAL DATA:  Low back pain. Rule out cauda equina syndrome. Leg weakness, ataxia. Numbness on left EXAM: MRI LUMBAR SPINE WITHOUT AND WITH CONTRAST TECHNIQUE: Multiplanar and multiecho pulse sequences of the lumbar spine were obtained without and with intravenous contrast. CONTRAST:  8 mL Vueway IV COMPARISON:  None Available. FINDINGS: Segmentation:  5 lumbar vertebra.  Lowest disc space L5-S1 Alignment:  Mild anterolisthesis L4-5 Vertebrae:  Negative for fracture or mass. Schmorl's node with bone marrow edema and enhancement in the superior endplate of Z00. Disc degeneration with discogenic sclerosis and edema at L2-3 eccentric to the left.  Hemangioma L4 vertebral body. Conus medullaris and cauda equina: Conus extends to the L1-2 level. Conus and cauda equina appear normal. Paraspinal and other soft tissues: Bilateral renal cysts. Largest cyst on the right measures 4 cm. Largest cyst on the left 5.7 cm. No paraspinous adenopathy. Disc levels: L1-2: Mild disc bulging and facet degeneration. No significant stenosis. L2-3: Disc degeneration with discogenic changes. Disc degeneration and spurring more prominent on the left than the right.  Moderate left subarticular and foraminal stenosis. Mild right subarticular stenosis. Borderline spinal stenosis L3-4: Advanced disc degeneration with disc space narrowing and diffuse endplate spurring. Moderate facet hypertrophy. Moderate central canal stenosis. Moderate subarticular and foraminal stenosis bilaterally due to spurring L4-5: Mild anterolisthesis. Disc degeneration with diffuse disc bulging. Bilateral facet hypertrophy. Mild spinal stenosis. Moderate subarticular stenosis bilaterally. L5-S1: Disc and facet degeneration.  No significant stenosis. IMPRESSION: Lumbar spondylosis. Multilevel spinal and subarticular stenosis as above Negative for fracture Electronically Signed   By: Marlan Palauharles  Clark M.D.   On: 11/25/2021 14:35   MR BRAIN W WO CONTRAST  Result Date: 11/25/2021 CLINICAL DATA:  TIA. Acute neuro deficit. Ataxia. Left-sided numbness. EXAM: MRI HEAD WITHOUT AND WITH CONTRAST TECHNIQUE: Multiplanar, multiecho pulse sequences of the brain and surrounding structures were obtained without and with intravenous contrast. CONTRAST:  8 mL Vueway IV COMPARISON:  CT head 11/25/2021 FINDINGS: Brain: Acute infarct right lateral thalamus. No other acute infarct. Mild ventricular enlargement consistent with the degree of atrophy. Moderate white matter changes with periventricular deep white matter hyperintensity bilaterally. Negative for hemorrhage or mass. Normal enhancement. Vascular: Normal arterial flow voids Skull and upper cervical spine: No focal skeletal lesion. Cervical spondylosis. Sinuses/Orbits: Mild mucosal edema paranasal sinuses. Negative orbit Other: None IMPRESSION: Acute infarct right lateral thalamus Atrophy and moderate white matter changes consistent with chronic microvascular ischemia. Electronically Signed   By: Marlan Palauharles  Clark M.D.   On: 11/25/2021 14:26   CT ANGIO HEAD NECK W WO CM W PERF (CODE STROKE)  Result Date: 11/25/2021 CLINICAL DATA:  Neuro deficit, acute, stroke suspected  EXAM: CT ANGIOGRAPHY HEAD AND NECK CT PERFUSION BRAIN TECHNIQUE: Multidetector CT imaging of the head and neck was performed using the standard protocol during bolus administration of intravenous contrast. Multiplanar CT image reconstructions and MIPs were obtained to evaluate the vascular anatomy. Carotid stenosis measurements (when applicable) are obtained utilizing NASCET criteria, using the distal internal carotid diameter as the denominator. Multiphase CT imaging of the brain was performed following IV bolus contrast injection. Subsequent parametric perfusion maps were calculated using RAPID software. RADIATION DOSE REDUCTION: This exam was performed according to the departmental dose-optimization program which includes automated exposure control, adjustment of the mA and/or kV according to patient size and/or use of iterative reconstruction technique. CONTRAST:  100mL OMNIPAQUE IOHEXOL 350 MG/ML SOLN COMPARISON:  None Available. FINDINGS: CTA NECK FINDINGS Aortic arch: Great vessel origins are patent without significant stenosis. Calcific atherosclerosis of the aorta. Right carotid system: No evidence of dissection, stenosis (50% or greater), or occlusion. Mild atherosclerosis. Left carotid system: No evidence of dissection, stenosis (50% or greater), or occlusion. Moderate atherosclerosis at the carotid bifurcation. Vertebral arteries: Right dominant. No evidence of dissection, stenosis (50% or greater), or occlusion. Skeleton: Severe multilevel degenerative change in the cervical spine. Other neck: No acute findings. Upper chest: Visualized lung apices are clear. Review of the MIP images confirms the above findings CTA HEAD FINDINGS Anterior circulation: Bilateral intracranial ICAs are patent with calcific atherosclerosis but no significant stenosis. Bilateral MCAs and bilateral  ACAs are patent without proximal hemodynamically significant stenosis. No aneurysm identified. Posterior circulation: Bilateral  intradural vertebral arteries, basilar artery and bilateral posterior cerebral arteries are patent without proximal hemodynamically significant stenosis. Venous sinuses: As permitted by contrast timing, patent. CT Brain Perfusion Findings: ASPECTS: 10. CBF (<30%) Volume: 76mL Perfusion (Tmax>6.0s) volume: 13mL Mismatch Volume: 47mL Infarction Location:None identified IMPRESSION: 1. No emergent large vessel occlusion or proximal hemodynamically significant stenosis. 2. No evidence of core infarct or penumbra on CT perfusion. Findings discussed with Dr. Selina Cooley via telephone at 12:45 p.m. Electronically Signed   By: Feliberto Harts M.D.   On: 11/25/2021 12:54   CT HEAD CODE STROKE WO CONTRAST  Result Date: 11/25/2021 CLINICAL DATA:  Code stroke.  Neuro deficit, acute, stroke suspected EXAM: CT HEAD WITHOUT CONTRAST TECHNIQUE: Contiguous axial images were obtained from the base of the skull through the vertex without intravenous contrast. RADIATION DOSE REDUCTION: This exam was performed according to the departmental dose-optimization program which includes automated exposure control, adjustment of the mA and/or kV according to patient size and/or use of iterative reconstruction technique. COMPARISON:  None Available. FINDINGS: Brain: No evidence of acute large vascular territory infarction, hemorrhage, hydrocephalus, extra-axial collection or mass lesion/mass effect. Patchy white matter hypodensities, nonspecific but compatible with chronic microvascular ischemic disease. Vascular: No hyperdense vessel identified. Calcific atherosclerosis. Skull: No acute fracture. Sinuses/Orbits: Mild paranasal sinus mucosal thickening. No acute orbital findings. Other: No mastoid effusions. ASPECTS [facility] Stroke Program Early CT Score) total score (0-10 with 10 being normal): 10. IMPRESSION: 1. No evidence of acute intracranial abnormality. 2. ASPECTS is 10. Code stroke imaging results were communicated on 11/25/2021 at 12:26 pm  to provider Selina Cooley via telephone, who verbally acknowledged these results. Electronically Signed   By: Feliberto Harts M.D.   On: 11/25/2021 12:26    Labs:  Basic Metabolic Panel: No results for input(s): "NA", "K", "CL", "CO2", "GLUCOSE", "BUN", "CREATININE", "CALCIUM", "MG", "PHOS" in the last 168 hours.   CBC: No results for input(s): "WBC", "NEUTROABS", "HGB", "HCT", "MCV", "PLT" in the last 168 hours.   CBG: Recent Labs  Lab 12/10/21 2200 12/11/21 2058  GLUCAP 118* 89    Family history.  Positive for hypertension as well as hyperlipidemia.  Denies any colon cancer esophageal cancer or rectal cancer  Brief HPI:   ELBY BLACKWELDER is a 88 y.o. right-handed male with history of hypertension, CAD/nonobstructive, chronic anemia as well as recent oral surgery.  Per chart review lives with adult aged grandchildren.  Independent with occasional cane.  [facility]ed [facility]2021 with acute onset of left-sided weakness transient confusion resulting in a fall.  CT/MRI showed acute infarction right lateral thalamus.  Patient did not receive tPA.  CT angiogram head and neck no emergent large vessel occlusion.  MRI cervical thoracic lumbar spine showed no acute changes.  No significant stenosis.  EEG negative for seizure.  Echocardiogram with ejection fraction of 70 to 75% no wall motion abnormalities.  Admission chemistries unremarkable.  Neurology follow-up maintained on low-dose aspirin as well as Plavix for CVA prophylaxis then aspirin alone.  Subcutaneous heparin for DVT prophylaxis.  Therapy evaluations completed due to patient's left-sided weakness decreased functional mobility was admitted for a comprehensive rehab program.   Hospital Course: JEZREEL JUSTINIANO was admitted to rehab 12/02/2021 for inpatient therapies to consist of PT, ST and OT at least three hours five days a week. Past admission physiatrist, therapy team and rehab RN have worked together to provide customized collaborative  inpatient  rehab.  Pertaining to patient right ischemic thalamic infarction remained stable he will continue on low-dose aspirin and Plavix through 12/17/2021 then aspirin alone.  Follow-up outpatient neurology services.  Lovenox for DVT prophylaxis no bleeding episodes.  Blood pressure controlled on Norvasc as well as hydralazine and will need outpatient follow-up.  Lipitor ongoing for hyperlipidemia.  CAD nonobstructive no chest pain or shortness of breath.  Bouts of constipation resolved with laxative assistance.  Chronic anemia latest hemoglobin 10.9 no bleeding episodes.   Blood pressures were monitored on TID basis and soft and monitored     Rehab course: During patient's stay in rehab weekly team conferences were held to monitor patient's progress, set goals and discuss barriers to discharge. At admission, patient required minimal assist 100 feet rolling walker min mod assist for sit to stand  Physical exam.  Blood pressure 146/58 pulse 81 temperature 98 respirations 15 oxygen saturations 100% room air Constitutional.  No acute distress HEENT Head.  Normocephalic and atraumatic Eyes.  Pupils round and reactive to light no discharge without nystagmus Neck.  Supple nontender no JVD without thyromegaly Cardiac regular rate and rhythm without any extra sounds or murmur Abdomen.  Soft nontender positive bowel sounds without rebound Respiratory effort normal no respiratory distress without wheeze Neurologic.  Alert oriented x3 to person place and situation but not oriented to time.  Follows simple commands.  CN II through XII intact Sensation intact to light touch in all 4 extremities Strength 5/5 bilateral upper extremities Strength 5/5 right upper extremity Strength 4/5 proximal 4+/5 distal left lower extremity DTR's 2+ Skin.  Warm and dry  He/She  has had improvement in activity tolerance, balance, postural control as well as ability to compensate for deficits. He/She has had  improvement in functional use RUE/LUE  and RLE/LLE as well as improvement in awareness.  Ambulates in the hallway 150 feet with assistive device contact-guard.  Contact-guard needed for turns and backwards walking.  Sit to stand rolling walker contact-guard.  Working with energy conservation techniques.  Completed dressing from recliner with set up for upper body contact-guard for lower body.  Speech therapy did follow-up for cognitive testing he did exhibit some difficulty comprehending hypothetical scenarios pertaining to managing a pillbox on a daily basis.  It was discussed with family need for supervision on discharge.  Full family teaching completed       Disposition: Discharge to home    Diet: Soft  Special Instructions: No driving smoking or alcohol  Medications at discharge. 1.  Aspirin 81 mg p.o. daily 2.  Plavix 75 mg daily through 12/17/2021 and stop 3.  Norvasc 10 mg daily 4.  Lipitor 40 mg p.o. daily 5.  Hydralazine 25 mg every 8 hours 6.  Protonix 20 mg p.o. daily 7.  MiraLAX twice daily hold for loose stools 8.  Senokot S 1 tablet nightly 9.  Magnesium gluconate 250 mg nightly  30-35 minutes were spent completing discharge summary and discharge planning  Discharge Instructions     Ambulatory referral to Neurology   Complete by: As directed    An appointment is requested in approximately: 4 weeks right thalamic infarction   Ambulatory referral to Physical Medicine Rehab   Complete by: As directed    Moderate complexity follow-up 1 to 2 weeks right thalamic infarction        Follow-up Information     Kinda Pottle, Victorino Sparrow, MD Follow up.   Specialty: Physical Medicine and Rehabilitation Why: office to call for appointment Contact  information: [address] [place] [address] [phone]                 Signed: Charlton [name] 12/14/2021, 5:24 AM

## 2021-12-09 NOTE — Progress Notes (Signed)
Speech Language Pathology Daily Session Note  Patient Details  Name: Kevin [name] MRN: [id number] Date of Birth: 1933/11/04  Today's Date: 12/09/2021 SLP Individual Time: 1100-1130 SLP Individual Time Calculation (min): 30 min  Short Term Goals: Week 1: SLP Short Term Goal 1 (Week 1): STGs=LTGs due to ELOS  Skilled Therapeutic Interventions: Patient missed initial 15 minutes of session due to being off the unit with PT. SLP facilitated session by ongoing education and functional practice with organizing and utilizing a TID pill box at home. Overall, patient required Max verbal and visual cues for problem solving but appeared to demonstrate increased comprehension/carryover of task by end of session. Patient demonstrated this by answering, "If its Monday at 6pm, what slot would you get your pills from to take" with 75% accuracy. Patient ambulated from the door to his recliner with CGA and Max verbal cues for safety as he reported that he did not need to use the RW when ambulating. SLP provided ongoing education regarding safety with ambulating. Patient left upright in recliner with family present. Continue with current plan of care.      Pain No/Denies Pain   Therapy/Group: Individual Therapy  Bobbi Yount 12/09/2021, 1:02 PM

## 2021-12-10 LAB — GLUCOSE, CAPILLARY: Glucose-Capillary: 118 mg/dL — ABNORMAL HIGH (ref 70–99)

## 2021-12-10 MED ORDER — SORBITOL 70 % SOLN
30.0000 mL | Freq: Every day | Status: DC | PRN
  Administered 2021-12-10: 30 mL via ORAL
  Filled 2021-12-10: qty 30

## 2021-12-10 MED ORDER — SENNOSIDES-DOCUSATE SODIUM 8.6-50 MG PO TABS
2.0000 | ORAL_TABLET | Freq: Every day | ORAL | Status: DC
  Administered 2021-12-10 – 2021-12-13 (×4): 2 via ORAL
  Filled 2021-12-10 (×4): qty 2

## 2021-12-10 NOTE — Progress Notes (Addendum)
Patient ID: Kevin [name], male   DOB: 11-30-1933, 88 y.o.   MRN: [id number]  [facility] referral sent to [facility] Patient approved, orders sent

## 2021-12-10 NOTE — Progress Notes (Signed)
Physical Therapy Session Note  Patient Details  Name: Kevin [name] MRN: [id number] Date of Birth: 1933-09-29  Today's Date: 12/10/2021 PT Individual Time: 0800-0857 PT Individual Time Calculation (min): 57 min   Short Term Goals: Week 1:  PT Short Term Goal 1 (Week 1): = to LTG's due to ELOS  Skilled Therapeutic Interventions/Progress Updates:       Pt supine in bed to start - RN at bedside for morning Rx. Pt agreeable to therapy session without reports of pain. Pt believes he's going home today - ed on DC date and reoriented to time.   Supine<>sitting EOB with supervision with HOB slightly raised. Completed UB/LB dressing while seated EOB. SetupA for UB dressing and minA for LB dressing for pulling pants over hips in standing and for zipping/buttoning pants. He also required assist for threading his belt through loops. ModA needed for donning tennis shoes - would benefit from AE such as long handeld shoe horn .  Pt struggeld somewhat with morning mobility - stiff and difficultly standing from bed with multiple failed efforts or LOB posteriorly.   Ambulated with CGA and RW to main rehab gym, ~162f - cues for upright posture, forward gaze, and proximity to RW.   In Rehab gym, worked on sit<>stands, BLE strengthening, and dynamic standing balance. 2x10 sit<>stands from low mat table to RW - pt reliant on UE support and unable to stand without UE to push from, supervision assist. Dynamic standing balance while shooting basketball - CGA for safety and pt able to be left standing alone for brief time while PT retrieved ball - supervision for static standing.   Pt ambulated back to his room at end of session, similar assist and cues as above. Concluded session in recliner with safety belt alarm on, call bell in reach. All needs met.      Therapy Documentation Precautions:  Precautions Precautions: Fall Precaution Comments: L hemiparesis Restrictions Weight Bearing Restrictions:  No General:     Therapy/Group: Individual Therapy  CAlger Simons10/01/2022, 7:38 AM

## 2021-12-10 NOTE — Progress Notes (Signed)
Occupational Therapy Session Note  Patient Details  Name: Kevin [name] MRN: [id number] Date of Birth: 1933-08-31  Today's Date: 12/10/2021 OT Individual Time: 3016-0109 OT Individual Time Calculation (min): 73 min    Short Term Goals: Week 2:  OT Short Term Goal 1 (Week 2): STG= LTGs  Skilled Therapeutic Interventions/Progress Updates:  Pt greeted seated in recliner, pt agreeable to OT intervention. Session focus on  functional mobility, dynamic standing balance and decreasing overall caregiver burden.   Total a transport to ADL apt in w/c for time mgmt.  Pt completed functional ambulation with Rw to flat HOB and low couch with CGA, pt did need MINa to stand from low couch however pt reports nothing that low at home.  Pt reprots grab bar on L side of walk in shower, pt able to demo stepping over threshold holding on to bar on L side with MINA. Of note the shower chair that was delieverd does not have arm rests therefore need to practice stepping into shower to seat with no arm rests. Pt stating that he will probably take a bath at home, do not recommend pt taking a bath at this time.   Worked on dynamic standing balance with pt tossing ball to trampoline in standing, pt completed task with CGA no LOB, graded task up and had pt use 2 lb weighted ball, one minor LOB during this task with pt needing MINA to recover.pt also able to complete task standing on compliant surface with no UE support and CGA with no LOB, HR did increase to 130 bpm but able to decrease to 96 bpm with seated rest break.   Pt completed functional ambulation back to room with RW and CGA. Issued pt theraputty HEP to work on grip strength/coordination in [place], additionally issued pt written HEP to increase carryover.  Pt left seated in recliner with alarm belt activated and all needs within reach.   Therapy Documentation Precautions:  Precautions Precautions: Fall Precaution Comments: L hemiparesis Restrictions Weight  Bearing Restrictions: No  Pain: no pain     Therapy/Group: Individual Therapy  [name] 12/10/2021, 3:43 PM

## 2021-12-10 NOTE — Progress Notes (Signed)
Speech Language Pathology Daily Session Note  Patient Details  Name: Kevin [name] MRN: [id number] Date of Birth: 09/16/1933  Today's Date: 12/10/2021 SLP Individual Time: 1000-1100 SLP Individual Time Calculation (min): 60 min  Short Term Goals: Week 1: SLP Short Term Goal 1 (Week 1): STGs=LTGs due to ELOS  Skilled Therapeutic Interventions: Skilled ST treatment focused on cognitive goals. SLP facilitated mildly complex problem solving skills with "solving daily math problems" subtest via the ALFA. Pt completed successfully with mod-to-max A verbal and visual cues and extended processing time. Pt utilized pen and paper to solve most problems. Pt stated he was unfamiliar with using a calculator. Pt was typically able to verbalize accurate steps involved with determining answers, however increased verbal reinforcement needed to generate actual response. Pt with increased difficulty with money scenarios as compared to time scenarios. Pt reported daughter manages finances.   SLP facilitated "telling time" subtest using analog clock. Pt completed task with 50% accuracy given no cues, progressing to 80% accuracy with mod A verbal cues.   SLP decreased complexity of tasks by facilitating additional basic, functional problem solving scenarios with mod A verbal cues (i.e., if it is currently 10:30am, what time will it be in one hour?) (i.e., If you take a nap at 3pm and wake up at 6pm, how much sleep did you get?). Pt continues to exhibit difficulty with medication related scenarios. When asked "when would you take your medicine if your doctor said to take two pills in the evening?" Pt responded "I would take two at 4pm  and 2 at 5pm" requiring max A to repair.  Patient was left in wheelchair with alarm activated and immediate needs within reach at end of session. Continue per current plan of care.       Pain  None/denied  Therapy/Group: Individual Therapy  Patty Sermons 12/10/2021, 10:21  AM

## 2021-12-10 NOTE — Progress Notes (Signed)
Occupational Therapy Weekly Progress Note  Patient Details  Name: Kevin [name] MRN: [id number] Date of Birth: 08-05-1933  Beginning of progress report period: December 03, 2021 End of progress report period: December 10, 2021  Today's Date: 12/10/2021  Patient has met 1 of 1 short term goals. Pt completes bathing with supervision from shower level, set- up for UB dressing, CGA for LB dressing, and CGA for ambulatory ADL transfers with RW. Pt continues to present with balance impairments impacting pts ability to complete ADLs independently. Pt has memory deficits impacting safety during ADLs and functional mobility that daughter reports are baseline. Did have brief verbal family ed session with daughter [name] Patient but plan for daughter that lives with pt to come in over the weekend 10/14-15 for furhter training with tentative DC set for 10/16. Continue with POC.   Patient continues to demonstrate the following deficits: muscle weakness, decreased cardiorespiratoy endurance, decreased attention, decreased awareness, decreased safety awareness, and delayed processing, and decreased standing balance, decreased postural control, and decreased balance strategies and therefore will continue to benefit from skilled OT intervention to enhance overall performance with BADL and Reduce care partner burden.  Patient progressing toward long term goals..  Continue plan of care.  OT Short Term Goals Week 1:  OT Short Term Goal 1 (Week 1): Pt will increase sit to stand from standard height seat to Min A with use of RW while receiving 1 cue if needed for technique. OT Short Term Goal 1 - Progress (Week 1): Met Week 2:  OT Short Term Goal 1 (Week 2): STG= LTGs    Therapy Documentation Precautions:  Precautions Precautions: Fall Precaution Comments: L hemiparesis Restrictions Weight Bearing Restrictions: No     Therapy/Group: Individual Therapy  Corinne Ports [facility] 12/10/2021, 8:02 AM

## 2021-12-10 NOTE — Progress Notes (Signed)
PROGRESS NOTE   Subjective/Complaints:   Discussed no bicycle riding with pt Per daughter (yesterday) pt has been declining cognitively pre stroke , he told family he wanted to ride a horse  Discussed no driving and no lawn tractor riding  Review of Systems  Constitutional:  Negative for chills, fever and malaise/fatigue.  HENT:  Negative for congestion.   Eyes:  Negative for double vision.  Respiratory:  Negative for cough and shortness of breath.   Cardiovascular:  Negative for chest pain.  Gastrointestinal:  Positive for constipation.  Genitourinary: Negative.   Musculoskeletal: Negative.   Neurological:  Positive for focal weakness.    Objective:   No results found. No results for input(s): "WBC", "HGB", "HCT", "PLT" in the last 72 hours.  No results for input(s): "NA", "K", "CL", "CO2", "GLUCOSE", "BUN", "CREATININE", "CALCIUM" in the last 72 hours.   Intake/Output Summary (Last 24 hours) at 12/10/2021 0841 Last data filed at 12/10/2021 0820 Gross per 24 hour  Intake 958 ml  Output 350 ml  Net 608 ml         Physical Exam: Vital Signs Blood pressure 137/77, pulse 77, temperature 97.9 F (36.6 C), temperature source Oral, resp. rate 18, height 6\' 2"  (1.88 m), weight 75.2 kg, SpO2 98 %.    General: No acute distress Mood and affect are appropriate Heart: Regular rate and rhythm no rubs murmurs or extra sounds Lungs: Clear to auscultation, breathing unlabored, no rales or wheezes Abdomen: Positive bowel sounds, soft nontender to palpation, nondistended Extremities: No clubbing, cyanosis, or edema  Skin: warm and dry Neuro:  Alert and oriented to person, place and situation. Not oriented to date. Follows commands. Can name and repeat.  Slightly slow processing. CN 2-12 intact.  5/5 in right and 4+/5 left delt , bi, tri, grip, HF, KE, ADF No evidence of dysdiadochokinesis with Rapid alt sup pronation of  both forearms  Intact finger to thumb opposition     Assessment/Plan: 1. Functional deficits which require 3+ hours per day of interdisciplinary therapy in a comprehensive inpatient rehab setting. Physiatrist is providing close team supervision and 24 hour management of active medical problems listed below. Physiatrist and rehab team continue to assess barriers to discharge/monitor patient progress toward functional and medical goals  Care Tool:  Bathing    Body parts bathed by patient: Right arm, Left arm, Chest, Front perineal area, Abdomen, Right upper leg, Left upper leg, Face, Right lower leg, Left lower leg, Buttocks   Body parts bathed by helper: Buttocks     Bathing assist Assist Level: Contact Guard/Touching assist     Upper Body Dressing/Undressing Upper body dressing   What is the patient wearing?: Pull over shirt    Upper body assist Assist Level: Set up assist    Lower Body Dressing/Undressing Lower body dressing      What is the patient wearing?: Underwear/pull up, Pants     Lower body assist Assist for lower body dressing: Contact Guard/Touching assist     Toileting Toileting    Toileting assist Assist for toileting: Independent with assistive device Assistive Device Comment: urinal   Transfers Chair/bed transfer  Transfers assist  Chair/bed transfer assist level: Contact Guard/Touching assist     Locomotion Ambulation   Ambulation assist      Assist level: Contact Guard/Touching assist Assistive device: Walker-rolling Max distance: 150'   Walk 10 feet activity   Assist     Assist level: Contact Guard/Touching assist Assistive device: Walker-rolling   Walk 50 feet activity   Assist Walk 50 feet with 2 turns activity did not occur: Safety/medical concerns  Assist level: Contact Guard/Touching assist Assistive device: Walker-rolling    Walk 150 feet activity   Assist Walk 150 feet activity did not occur:  Safety/medical concerns  Assist level: Contact Guard/Touching assist Assistive device: Walker-rolling    Walk 10 feet on uneven surface  activity   Assist Walk 10 feet on uneven surfaces activity did not occur: Safety/medical concerns         Wheelchair     Assist Is the patient using a wheelchair?: Yes (transported pt to and from gym for time management) Type of Wheelchair: Manual    Wheelchair assist level: Dependent - Patient 0% Max wheelchair distance: 165ft    Wheelchair 50 feet with 2 turns activity    Assist        Assist Level: Dependent - Patient 0%   Wheelchair 150 feet activity     Assist      Assist Level: Dependent - Patient 0%   Blood pressure 137/77, pulse 77, temperature 97.9 F (36.6 C), temperature source Oral, resp. rate 18, height 6\' 2"  (1.88 m), weight 75.2 kg, SpO2 98 %.  Medical Problem List and Plan: 1. Functional deficits secondary to right ischemic thalamic infarction             -patient may shower             -ELOS/Goals: 10/16 Min A with PT/OT, sup with SLP             -Continue CIR with PT/OT/SLP 2.  Antithrombotics: -DVT/anticoagulation:  Pharmaceutical: Heparin- switch to lovenox, pt has nl renal fxn             -antiplatelet therapy: Aspirin 81 mg daily and Plavix 75 mg daily x3 weeks (12/17/21) then aspirin alone 3. Pain Management: Tylenol as needed 4. Mood/Behavior/Sleep: Provide emotional support             -antipsychotic agents: N/A 5. Neuropsych/cognition: This patient is capable of making decisions on his own behalf. 6. Skin/Wound Care: Routine skin checks 7. Fluids/Electrolytes/Nutrition: Routine in and outs with follow-up chemistries 8.  Hypertension.  Norvasc 10 mg daily, hydralazine 25 mg every 8 hours.  Monitor with increased mobility. Avoid hypotension.    Vitals:   12/09/21 2033 12/10/21 0445  BP: 135/72 137/77  Pulse: 74 77  Resp: 19 18  Temp: 97.9 F (36.6 C) 97.9 F (36.6 C)  SpO2: 100%  98%    9.  Hyperlipidemia.  Lipitor. Heart healthy diet 10.  CAD/nonobstructive.  Continue aspirin Plavix.  No complaints of chest pain 11.  Constipation.  MiraLAX twice daily, Senokot nightly.             -  -10/7 large type 4 BM, increase senna to BID  12. Poor dentition             -Dys 3 diet 13. Anemia- mild             -HGB down a little to 10.9, recheck monday 14. Azotemia    -10/7 Bun down to 22, Cr 0.87 improved, continue  to monitor     LOS: 8 days A FACE TO FACE EVALUATION WAS PERFORMED  Erick Colace 12/10/2021, 8:41 AM

## 2021-12-11 LAB — GLUCOSE, CAPILLARY: Glucose-Capillary: 89 mg/dL (ref 70–99)

## 2021-12-11 NOTE — Progress Notes (Signed)
Speech Language Pathology Weekly Progress and Session Note  Patient Details  Name: Kevin [name] MRN: [id number] Date of Birth: 12-16-1933  Beginning of progress report period: December 04, 2021 End of progress report period: December 11, 2021  Today's Date: 12/11/2021 SLP Individual Time: 8469-6295 SLP Individual Time Calculation (min): 45 min  Short Term Goals: Week 1: SLP Short Term Goal 1 (Week 1): STGs=LTGs due to ELOS SLP Short Term Goal 1 - Progress (Week 1): Other (comment) (revised due to increased insight from family regarding pt's PLOF)  New Short Term Goals: Week 2: SLP Short Term Goal 1 (Week 2): STG=LTG due to ELOS  Weekly Progress Updates: Pt has demonstrated active participation in skilled ST intervention and progressing toward long term goals set to mod A level. LTGs were downgraded after obtaining increased insight regarding pt's PLOF per OT's discussion with daughter. Per daughter report, pt required increased support and assist with memory, problem solving, and safety within the home environment and reportedly provided consistent supervision prior to admission. Pt is currently completing mildly complex cognitive tasks at the mod-to-max A level. Pt and family education ongoing. Continue to recommend ST intervention during CIR admission, as well as ST f/u upon d/c. Recommend 24/7 supervision and assistance at time of discharge.     Intensity: Minumum of 1-2 x/day, 30 to 90 minutes Frequency: 3 to 5 out of 7 days Duration/Length of Stay: 10/16 Treatment/Interventions: Cognitive remediation/compensation;Internal/external aids;Cueing hierarchy;Environmental controls;Therapeutic Activities;Functional tasks;Patient/family education  Daily Session Skilled Therapeutic Interventions: Skilled ST treatment focused on cognitive goals. SLP facilitated identification of unsafe household and community safety scenarios using problem solving picture cards with sup A verbal cues. Pt generated  appropriate solutions with sup-to-min A verbal cues, and anticipated ways to prevent similar events from occurring  in the future with min A verbal cues. Pt more likely to anticipate prevention strategies with scenarios on picture cards, as compared to scenarios at his personal life/home, as pt would often discuss how he used to do things pre-stroke with reduced perspective taking within current level of functioning. This skill appeared to improve with ongoing verbal reinforcement. Patient was left in recliner with alarm activated and immediate needs within reach at end of session. Continue per current plan of care.      General    Pain  None/denied  Therapy/Group: Individual Therapy  Patty Sermons 12/11/2021, 12:24 PM

## 2021-12-11 NOTE — Progress Notes (Signed)
Occupational Therapy Session Note  Patient Details  Name: Kevin [name] MRN: [id number] Date of Birth: [birth year]  Today's Date: 12/11/2021 OT Individual Time: 0805-0905 session 1 OT Individual Time Calculation (min): 60 min  Session 2: 1352-1436   Short Term Goals: Week 2:  OT Short Term Goal 1 (Week 2): STG= LTGs  Skilled Therapeutic Interventions/Progress Updates:  Session 1: Pt greeted seated in recliner, pt agreeable to OT intervention. Session focus on BADL reeducation, functional mobility, dynamic standing balance and decreasing overall caregiver burden.         Noted pts pants soiled with urine, unsure if pt incontinent or spilled urinal during the night, pt with no awareness to urine on pants. Pt completed functional ambulation from recliner>bathroom with RW and CGA. Pt entered shower with CGA and MIN verbal cues for sequencing technique into walkin shower with grab bar. Pt completed bathing tasks from shower seat with overall supervision.  Pt continues to present with memory deficits asking the same questions twice 'whats the weather outside?" and "did you have a good night last night?"      Pt exited shower with MIN HHA for Increased safety when exiting. Pt completed grooming tasks from w/c with supervision.  Pt donned [place] shirt with set- up assist, pt donned pants with MIN A today as pt needed + assist d/t pants being too big and needing assist to don belt.  pt left seated in recliner with alarm belt activated and all needs within reach.                      Session 2:  Pt greeted seated in recliner, pt  agreeable to OT intervention. Session focus on BADL reeducation, functional mobility, dynamic standing balance and decreasing overall caregiver burden.  Alerted pt that daughters were scheduled to come in Saturday for family ed with potential DC of Monday, pt happy about this. Worked required DC assessments as indicated below in prep for upcoming DC.  Pt reports need to void bladder,  pt completed functional ambulation from recliner>bathroom with Rw and CGA. Once in threshold of bathroom pt noted to set walker aside, education required on using Rw at all times. Pt able to stand to void bladder with supervision. Pt exited bathroom with Rw and CGA and able to stand at sink for hand hygiene with supervision. Pt adjusting belt in standing needing MINA to tuck shirt into pants with pt noted to lose balance during this task needing increased support.  Pt transported to day room in w/c with total A to work on dynamic standing balance, pt able to stand on compliant surface to match cards on board. Pt able to accurately match cards with 90% accuracy. Pt completed task with no UE support and no LOB. Pt ambulated back to room with rw and CGA. Pt left seated in recliner with alarm belt activated and all needs within reach.                    Therapy Documentation Precautions:  Precautions Precautions: Fall Precaution Comments: L hemiparesis Restrictions Weight Bearing Restrictions: No  Pain: session 1: unrated "soreness" reported in bilateral shoulders Session 2: no pain reported during session   Vision Baseline Vision/History: 0 No visual deficits Patient Visual Report: No change from baseline Vision Assessment?: No apparent visual deficits Perception  Perception: Within Functional Limits Praxis Praxis: Intact Cognition Cognition Overall Cognitive Status: Impaired/Different from baseline Arousal/Alertness: Awake/alert Brief Interview for Mental Status (BIMS) Repetition  of Three Words (First Attempt): 3 Temporal Orientation: Year: Missed by more than 5 years (1983) Temporal Orientation: Month: No answer (I dont know) Temporal Orientation: Day: Correct Recall: "Sock": No, could not recall Recall: "Blue": No, could not recall Recall: "Bed": No, could not recall BIMS Summary Score: 4 Sensation Sensation Light Touch: Impaired Detail Peripheral sensation comments: R palmar  hand feels "doesnt have that touch feeling its supposed to have." Light Touch Impaired Details: Impaired RUE Hot/Cold: Appears Intact Proprioception: Not tested Stereognosis: Not tested Coordination Gross Motor Movements are Fluid and Coordinated: No Fine Motor Movements are Fluid and Coordinated: No Motor    Mobility     Trunk/Postural Assessment     Balance   Extremity/Trunk Assessment RUE Assessment RUE Assessment: Exceptions to [facility] Active Range of Motion (AROM) Comments: Significant shoulder clunking noted during shoulder flexion and abduction; suspect degeneration of AC joint. A/ROM [facility] for shoulder, elbow, wrist ranges. General Strength Comments: Shoulder flexion/abduction/IR/er: 5/5, Elbow flexion/extension: 5/5, gross grasp functional LUE Assessment LUE Assessment: Exceptions to [facility] Active Range of Motion (AROM) Comments: A/ROM WFL in all ranges. General Strength Comments: Strength: 5/5 in shoulder, elbow, and wrist ranges.   Therapy/Group: Individual Therapy  [name] 12/11/2021, 12:15 PM

## 2021-12-11 NOTE — Progress Notes (Signed)
Physical Therapy Session Note  Patient Details  Name: Kevin [name] MRN: [id number] Date of Birth: June 11, 1933  Today's Date: 12/11/2021 PT Individual Time: 1001-1103 PT Individual Time Calculation (min): 62 min   Short Term Goals: Week 1:  PT Short Term Goal 1 (Week 1): = to LTG's due to ELOS  Skilled Therapeutic Interventions/Progress Updates:     Pt greeted sitting in recliner and agreeable to therapy. No c/o pain.   Sit>stand from [facility] multiple times throughout session requiring CGA and heavy use of B UE on arm rests.   Pt able to amb with RW with CGA 227f and 1554f Heavy used of B UE and occasional difficulty with L foot swing through that required occasional cues for correction, more frequent cuing for upright posture and fwd gaze.   Amb without RW performed for 80 ft to challenge dynamic balance pt required L HHA and min/mod assist for balance and steadiness. Decreased step and stride length noted without AD as compared to with AD and increased difficulty with L LE swing through as well.   Car transfer:  SPT first demonstrated and educated on technique and sequencing. Pt then performed and showed good understanding requiring CGA and minimal cuing for hand placement and RW management.   Ramp negotiation x 2. Cuing needed initially to keep [facility] to increase attention to L foot clearance during swing on uneven surfaces. Pt performed well and followed cuing. CGA given for safety.   Blocked Practice of sit<> stand transfers with no UE support performed to increase LE strength. Table height initially at 67cm and pt performed x 3 with cues to not use back of the thighs to stabilize against the table.  Airex then put under R LE to increase L LE use. Pt performed x 5 and required min A for balance and trunk support. Decreased balance upon standing due to airex under R LE.  Table height increased to 70cm and airex taken away. Sit <> stand performed with no UE support x 8 with  CGA and with reaching fwd for horseshoes to increase fwd trunk lean to ease sit to stand.   Stand<> sit from RW to recliner at beginning and end of session with CGA and B UE support on arm rests .   Pt left in recliner with brakes locked, call bell in reach, all needs met.   Therapy Documentation Precautions:  Precautions Precautions: Fall Precaution Comments: L hemiparesis Restrictions Weight Bearing Restrictions: No  Therapy/Group: Individual Therapy  AnKayleen Memos0/13/2023, 7:59 AM

## 2021-12-11 NOTE — Progress Notes (Signed)
Patient ID: Kevin [name], male   DOB: 12-Jun-1933, 88 y.o.   MRN: [id number]  Family education scheduled Saturday 9-12

## 2021-12-11 NOTE — Progress Notes (Signed)
Occupational Therapy Discharge Summary  Patient Details  Name: Kevin [name] MRN: [id number] Date of Birth: [birth year]  Date of Discharge from OT service:{Time; dates multiple:304500300}  {CHL IP REHAB OT TIME CALCULATIONS:304400400}   Patient has met {NUMBERS 0-12:18577} of {NUMBERS 0-12:18577} long term goals due to {due SV:7793903}.  Patient to discharge at overall {LOA:3049010} level.  Patient's care partner {care partner:3041650} to provide the necessary {assistance:3041652} assistance at discharge.    Reasons goals not met: ***  Recommendation:  Patient will benefit from ongoing skilled OT services in {setting:3041680} to continue to advance functional skills in the area of {ADL/iADL:3041649}.  Equipment: {equipment:3041657}  Reasons for discharge: {Reason for discharge:3049018}  Patient/family agrees with progress made and goals achieved: {Pt/Family agree with progress/goals:3049020}  OT Discharge    ADL ADL Eating: Set up Where Assessed-Eating: Bed level Grooming: Supervision/safety Where Assessed-Grooming: Sitting at sink Upper Body Bathing: Setup Where Assessed-Upper Body Bathing: Sitting at sink Lower Body Bathing: Minimal assistance Where Assessed-Lower Body Bathing: Sitting at sink Upper Body Dressing: Setup Where Assessed-Upper Body Dressing: Edge of bed Lower Body Dressing: Minimal assistance Where Assessed-Lower Body Dressing: Edge of bed Toileting: Minimal assistance Where Assessed-Toileting: Toilet, Bedside Commode Toilet Transfer: Minimal assistance Toilet Transfer Method: Human resources officer: Not assessed Social research officer, government: Not assessed Vision Baseline Vision/History: 0 No visual deficits Patient Visual Report: No change from baseline Vision Assessment?: No apparent visual deficits Perception  Perception: Within Functional Limits Praxis Praxis: Intact Cognition Cognition Overall Cognitive Status: Impaired/Different from  baseline Arousal/Alertness: Awake/alert Brief Interview for Mental Status (BIMS) Repetition of Three Words (First Attempt): 3 Temporal Orientation: Year: Missed by more than 5 years (1983) Temporal Orientation: Month: No answer (I dont know) Temporal Orientation: Day: Correct Recall: "Sock": No, could not recall Recall: "Blue": No, could not recall Recall: "Bed": No, could not recall BIMS Summary Score: 4 Sensation Sensation Light Touch: Impaired Detail Peripheral sensation comments: R palmar hand feels "doesnt have that touch feeling its supposed to have." Light Touch Impaired Details: Impaired RUE Hot/Cold: Appears Intact Proprioception: Not tested Stereognosis: Not tested Coordination Gross Motor Movements are Fluid and Coordinated: No Fine Motor Movements are Fluid and Coordinated: No Motor    Mobility     Trunk/Postural Assessment     Balance   Extremity/Trunk Assessment RUE Assessment RUE Assessment: Exceptions to [facility] Active Range of Motion (AROM) Comments: Significant shoulder clunking noted during shoulder flexion and abduction; suspect degeneration of AC joint. A/ROM [facility] for shoulder, elbow, wrist ranges. General Strength Comments: Shoulder flexion/abduction/IR/er: 5/5, Elbow flexion/extension: 5/5, gross grasp functional LUE Assessment LUE Assessment: Exceptions to [facility] Active Range of Motion (AROM) Comments: A/ROM WFL in all ranges. General Strength Comments: Strength: 5/5 in shoulder, elbow, and wrist ranges.   Corinne Ports [place] 12/11/2021, 2:06 PM

## 2021-12-11 NOTE — Progress Notes (Signed)
PROGRESS NOTE   Subjective/Complaints:   Working with OT No issues overnite   Review of Systems  Constitutional:  Negative for chills, fever and malaise/fatigue.  HENT:  Negative for congestion.   Eyes:  Negative for double vision.  Respiratory:  Negative for cough and shortness of breath.   Cardiovascular:  Negative for chest pain.  Gastrointestinal:  Positive for constipation.  Genitourinary: Negative.   Musculoskeletal: Negative.   Neurological:  Positive for focal weakness.    Objective:   No results found. No results for input(s): "WBC", "HGB", "HCT", "PLT" in the last 72 hours.  No results for input(s): "NA", "K", "CL", "CO2", "GLUCOSE", "BUN", "CREATININE", "CALCIUM" in the last 72 hours.   Intake/Output Summary (Last 24 hours) at 12/11/2021 0811 Last data filed at 12/11/2021 0713 Gross per 24 hour  Intake 1078 ml  Output 400 ml  Net 678 ml         Physical Exam: Vital Signs Blood pressure 123/64, pulse 65, temperature 98.2 F (36.8 C), resp. rate 16, height 6\' 2"  (1.88 m), weight 75.2 kg, SpO2 100 %.    General: No acute distress Mood and affect are appropriate Heart: Regular rate and rhythm no rubs murmurs or extra sounds Lungs: Clear to auscultation, breathing unlabored, no rales or wheezes Abdomen: Positive bowel sounds, soft nontender to palpation, nondistended Extremities: No clubbing, cyanosis, or edema  Skin: warm and dry Neuro:  Alert and oriented to person, place and situation. Not oriented to date. Follows commands. Can name and repeat.  Slightly slow processing. CN 2-12 intact.  5/5 in right and 4+/5 left delt , bi, tri, grip, HF, KE, ADF No evidence of dysdiadochokinesis with Rapid alt sup pronation of both forearms  Intact finger to thumb opposition     Assessment/Plan: 1. Functional deficits which require 3+ hours per day of interdisciplinary therapy in a comprehensive inpatient  rehab setting. Physiatrist is providing close team supervision and 24 hour management of active medical problems listed below. Physiatrist and rehab team continue to assess barriers to discharge/monitor patient progress toward functional and medical goals  Care Tool:  Bathing    Body parts bathed by patient: Right arm, Left arm, Chest, Front perineal area, Abdomen, Right upper leg, Left upper leg, Face, Right lower leg, Left lower leg, Buttocks   Body parts bathed by helper: Buttocks     Bathing assist Assist Level: Contact Guard/Touching assist     Upper Body Dressing/Undressing Upper body dressing   What is the patient wearing?: Pull over shirt    Upper body assist Assist Level: Set up assist    Lower Body Dressing/Undressing Lower body dressing      What is the patient wearing?: Underwear/pull up, Pants     Lower body assist Assist for lower body dressing: Contact Guard/Touching assist     Toileting Toileting    Toileting assist Assist for toileting: Independent with assistive device Assistive Device Comment: urinal   Transfers Chair/bed transfer  Transfers assist     Chair/bed transfer assist level: Contact Guard/Touching assist     Locomotion Ambulation   Ambulation assist      Assist level: Contact Guard/Touching assist Assistive device: Walker-rolling  Max distance: 150'   Walk 10 feet activity   Assist     Assist level: Contact Guard/Touching assist Assistive device: Walker-rolling   Walk 50 feet activity   Assist Walk 50 feet with 2 turns activity did not occur: Safety/medical concerns  Assist level: Contact Guard/Touching assist Assistive device: Walker-rolling    Walk 150 feet activity   Assist Walk 150 feet activity did not occur: Safety/medical concerns  Assist level: Contact Guard/Touching assist Assistive device: Walker-rolling    Walk 10 feet on uneven surface  activity   Assist Walk 10 feet on uneven surfaces  activity did not occur: Safety/medical concerns         Wheelchair     Assist Is the patient using a wheelchair?: Yes (transported pt to and from gym for time management) Type of Wheelchair: Manual    Wheelchair assist level: Dependent - Patient 0% Max wheelchair distance: 130ft    Wheelchair 50 feet with 2 turns activity    Assist        Assist Level: Dependent - Patient 0%   Wheelchair 150 feet activity     Assist      Assist Level: Dependent - Patient 0%   Blood pressure 123/64, pulse 65, temperature 98.2 F (36.8 C), resp. rate 16, height 6\' 2"  (1.88 m), weight 75.2 kg, SpO2 100 %.  Medical Problem List and Plan: 1. Functional deficits secondary to right ischemic thalamic infarction             -patient may shower             -ELOS/Goals: 10/16 Min A with PT/OT, sup with SLP             -Continue CIR with PT/OT/SLP 2.  Antithrombotics: -DVT/anticoagulation:  Pharmaceutical: Heparin- switch to lovenox, pt has nl renal fxn             -antiplatelet therapy: Aspirin 81 mg daily and Plavix 75 mg daily x3 weeks (12/17/21) then aspirin alone 3. Pain Management: Tylenol as needed 4. Mood/Behavior/Sleep: Provide emotional support             -antipsychotic agents: N/A 5. Neuropsych/cognition: This patient is capable of making decisions on his own behalf. 6. Skin/Wound Care: Routine skin checks 7. Fluids/Electrolytes/Nutrition: Routine in and outs with follow-up chemistries 8.  Hypertension.  Norvasc 10 mg daily, hydralazine 25 mg every 8 hours.  Monitor with increased mobility. Avoid hypotension.    Vitals:   12/10/21 2018 12/11/21 0429  BP: (!) 160/71 123/64  Pulse: 74 65  Resp: 16 16  Temp: 97.6 F (36.4 C) 98.2 F (36.8 C)  SpO2: 99% 100%    9.  Hyperlipidemia.  Lipitor. Heart healthy diet 10.  CAD/nonobstructive.  Continue aspirin Plavix.  No complaints of chest pain 11.  Constipation.  MiraLAX twice daily, Senokot nightly.              -  -10/12 BM 12. Poor dentition             -Dys 3 diet 13. Anemia- mild             -HGB down a little to 10.9, recheck monday 14. Azotemia    -10/7 Bun down to 22, Cr 0.87 improved, continue to monitor     LOS: 9 days A FACE TO FACE EVALUATION WAS PERFORMED  Charlett Blake 12/11/2021, 8:11 AM

## 2021-12-12 MED ORDER — MAGNESIUM GLUCONATE 500 MG PO TABS
250.0000 mg | ORAL_TABLET | Freq: Every day | ORAL | Status: DC
  Administered 2021-12-12 – 2021-12-13 (×2): 250 mg via ORAL
  Filled 2021-12-12 (×2): qty 1

## 2021-12-12 NOTE — Progress Notes (Signed)
PROGRESS NOTE   Subjective/Complaints: He is feeling well Both daughters are visiting Patient and daughters have no concerns Tolerated therapy well  Review of Systems  Constitutional:  Negative for chills, fever and malaise/fatigue.  HENT:  Negative for congestion.   Eyes:  Negative for double vision.  Respiratory:  Negative for cough and shortness of breath.   Cardiovascular:  Negative for chest pain.  Gastrointestinal:  Positive for constipation.  Genitourinary: Negative.   Musculoskeletal: Negative.   Neurological:  Positive for focal weakness.  Denies pain  Objective:   No results found. No results for input(s): "WBC", "HGB", "HCT", "PLT" in the last 72 hours.  No results for input(s): "NA", "K", "CL", "CO2", "GLUCOSE", "BUN", "CREATININE", "CALCIUM" in the last 72 hours.   Intake/Output Summary (Last 24 hours) at 12/12/2021 1913 Last data filed at 12/12/2021 1700 Gross per 24 hour  Intake 947 ml  Output 300 ml  Net 647 ml         Physical Exam: Vital Signs Blood pressure (!) 151/64, pulse 91, temperature 97.8 F (36.6 C), temperature source Oral, resp. rate 15, height 6\' 2"  (1.88 m), weight 75.2 kg, SpO2 100 %.   Gen: no distress, normal appearing HEENT: oral mucosa pink and moist, NCAT Cardio: Reg rate Chest: normal effort, normal rate of breathing Abd: soft, non-distended Ext: no edema Psych: pleasant, normal affect Skin: warm and dry Neuro:  Alert and oriented to person, place and situation. Not oriented to date. Follows commands. Can name and repeat.  Slightly slow processing. CN 2-12 intact.  5/5 in right and 4+/5 left delt , bi, tri, grip, HF, KE, ADF No evidence of dysdiadochokinesis with Rapid alt sup pronation of both forearms  Intact finger to thumb opposition     Assessment/Plan: 1. Functional deficits which require 3+ hours per day of interdisciplinary therapy in a comprehensive  inpatient rehab setting. Physiatrist is providing close team supervision and 24 hour management of active medical problems listed below. Physiatrist and rehab team continue to assess barriers to discharge/monitor patient progress toward functional and medical goals  Care Tool:  Bathing    Body parts bathed by patient: Right arm, Left arm, Chest, Front perineal area, Abdomen, Right upper leg, Left upper leg, Face, Right lower leg, Left lower leg, Buttocks   Body parts bathed by helper: Buttocks     Bathing assist Assist Level: Contact Guard/Touching assist     Upper Body Dressing/Undressing Upper body dressing   What is the patient wearing?: Pull over shirt    Upper body assist Assist Level: Set up assist    Lower Body Dressing/Undressing Lower body dressing      What is the patient wearing?: Underwear/pull up, Pants     Lower body assist Assist for lower body dressing: Contact Guard/Touching assist     Toileting Toileting    Toileting assist Assist for toileting: Independent with assistive device Assistive Device Comment: walker   Transfers Chair/bed transfer  Transfers assist     Chair/bed transfer assist level: Contact Guard/Touching assist     Locomotion Ambulation   Ambulation assist      Assist level: Contact Guard/Touching assist Assistive device: Walker-rolling Max distance:  150'   Walk 10 feet activity   Assist     Assist level: Contact Guard/Touching assist Assistive device: Walker-rolling   Walk 50 feet activity   Assist Walk 50 feet with 2 turns activity did not occur: Safety/medical concerns  Assist level: Contact Guard/Touching assist Assistive device: Walker-rolling    Walk 150 feet activity   Assist Walk 150 feet activity did not occur: Safety/medical concerns  Assist level: Contact Guard/Touching assist Assistive device: Walker-rolling    Walk 10 feet on uneven surface  activity   Assist Walk 10 feet on uneven  surfaces activity did not occur: Safety/medical concerns   Assist level: Contact Guard/Touching assist Assistive device: Walker-rolling   Wheelchair     Assist Is the patient using a wheelchair?: Yes (transported pt to and from gym for time management) Type of Wheelchair: Manual    Wheelchair assist level: Dependent - Patient 0% Max wheelchair distance: 110ft    Wheelchair 50 feet with 2 turns activity    Assist        Assist Level: Dependent - Patient 0%   Wheelchair 150 feet activity     Assist      Assist Level: Dependent - Patient 0%   Blood pressure (!) 151/64, pulse 91, temperature 97.8 F (36.6 C), temperature source Oral, resp. rate 15, height 6\' 2"  (1.88 m), weight 75.2 kg, SpO2 100 %.  Medical Problem List and Plan: 1. Functional deficits secondary to right ischemic thalamic infarction             -patient may shower             -ELOS/Goals: 10/16 Min A with PT/OT, sup with SLP             -Continue CIR with PT/OT/SLP 2.  Antithrombotics: -DVT/anticoagulation:  Pharmaceutical: Heparin- switch to lovenox, pt has nl renal fxn             -antiplatelet therapy: Aspirin 81 mg daily and Plavix 75 mg daily x3 weeks (12/17/21) then aspirin alone 3. Pain Management: Tylenol as needed 4. Mood/Behavior/Sleep: Provide emotional support             -antipsychotic agents: N/A 5. Neuropsych/cognition: This patient is capable of making decisions on his own behalf. 6. Skin/Wound Care: Routine skin checks 7. Fluids/Electrolytes/Nutrition: Routine in and outs with follow-up chemistries 8.  Hypertension.  Norvasc 10 mg daily, hydralazine 25 mg every 8 hours.  Monitor with increased mobility. Avoid hypotension. Add magnesium gluconate 250mg  HS   Vitals:   12/12/21 0403 12/12/21 1341  BP: (!) 162/74 (!) 151/64  Pulse: 66 91  Resp: 16 15  Temp: 98 F (36.7 C) 97.8 F (36.6 C)  SpO2: 100% 100%    9.  Hyperlipidemia.  Lipitor. Heart healthy diet 10.   CAD/nonobstructive.  Continue aspirin Plavix.  No complaints of chest pain 11.  Constipation.  MiraLAX twice daily, Senokot nightly. Add magnesium gluconate 250mg  HS             -  -10/12 BM 12. Poor dentition             -continue Dys 3 diet 13. Anemia- mild             -HGB down a little to 10.9, recheck monday 14. Azotemia    -10/7 Bun down to 22, Cr 0.87 improved, continue to monitor     LOS: 10 days A FACE TO FACE EVALUATION WAS PERFORMED  Clide Deutscher Bertina Guthridge 12/12/2021, 7:13 PM

## 2021-12-12 NOTE — Progress Notes (Signed)
Occupational Therapy Session Note  Patient Details  Name: Kevin [name] MRN: [id number] Date of Birth: Jun 22, 1933  Today's Date: 12/12/2021 OT Individual Time: 4627-0350 OT Individual Time Calculation (min): 53 min    Short Term Goals: Week 1:  OT Short Term Goal 1 (Week 1): Pt will increase sit to stand from standard height seat to Min A with use of RW while receiving 1 cue if needed for technique. OT Short Term Goal 1 - Progress (Week 1): Met Week 2:  OT Short Term Goal 1 (Week 2): STG= LTGs  Skilled Therapeutic Interventions/Progress Updates:    Patient received seated in recliner with safety belt in place.  Patient's two daughters present for family education.  Discussed need for 24 hour supervision.  Problem solved through issues relating to toileting - patient often wet at night - daughter feels this has to do with adjustment of incontinence brief.  Patient transported to ADL apartment.  Practiced shower stall transfers simulating his environment as close as possible.  Daughters and patient able to assist with information regarding set up.   Patient able to transfer into and out of shower to shower seat with simulated grab bars with supervision after demonstration.  Had patient demonstrate bed transfers - supervision level.  Encouraged and answered all questions.  Allowed daughters to complete functional ambulation and transfers with patient.   Patient left in recliner for lunch.  Daughters at bedside, safety belt in place.     Therapy Documentation Precautions:  Precautions Precautions: Fall Precaution Comments: L hemiparesis Restrictions Weight Bearing Restrictions: No  Pain: Pain Assessment Pain Scale: 0-10 Pain Score: 0-No pain   Therapy/Group: Individual Therapy  Mariah Milling 12/12/2021, 12:41 PM

## 2021-12-12 NOTE — Progress Notes (Addendum)
Speech Language Pathology Discharge Summary  Patient Details  Name: Kevin [name] MRN: [id number] Date of Birth: 1933-06-05  Date of Discharge from [place] 14, 2023  Today's Date: 12/12/2021 SLP Individual Time: 1020-1100 and 1505 -1535 SLP Individual Time Calculation (min): 70 min  Skilled Therapeutic Interventions:  Session 1: Skilled treatment session focused education with the patient and two daughters. SLP facilitated session by providing education regarding patient's current deficits and their impact on attention, memory, awareness, and overall safety. SLP reinforced the importance of 24 hour supervision to ensure safe decision making as patient has decreased awareness of deficits and recall of safety precautions (using RW). SLP provided strategies to maximize attention, recall, problem solving, and overall safety with functional tasks. SLP also provided discussion on cognitive activities/functional tasks patient can perform with assistance at home for ongoing cognitive engagement. Daughters reported they feel pt is at cognitive baseline, however noticed decreased frustration tolerance. SLP feels it would be appropriate to recommend home health services at discharge to support safe transition home and for reinforcement of new safety precautions and considerations s/p acute physical changes from CVA and current recommendations for use of walker. All verbalized understanding of education.  Session 2: Skilled ST treatment focused on cognitive goals.  SLP facilitated the session with a novel card game targeting memory, attention, and verbal reaoning. Pt completed task with mod A fading to sup A to monitor and correct errors, and for utilization of external memory aid for recall of task rules and procedures. Patient was left in bed with alarm activated and immediate needs within reach at end of session. Continue per current plan of care.    Patient has met 3 of 3 long term goals.  Patient  to discharge at overall Mod level.  Reasons goals not met: All goals met   Clinical Impression/Discharge Summary: Pt has demonstrated functional progress towards cognitive-linguistic goals meeting 3 out of 3 long-term goals this admission. Patient is currently completing functional and complex cognitive tasks with mod A cues in regards to problem solving, functional recall, and emergent/anticipatory awareness. Pt/family education completed. Care partners are independent to provide the necessary physical and cognitive assistance at discharge. Recommend 24 hour supervision and assistance with completion of all iADL tasks to include medication and financial management. Daughters reported they feel pt is at cognitive baseline, however noticed decreased frustration tolerance. As mentioned above, SLP feels it would be appropriate to recommend home health services at discharge to support safe transition home and for reinforcement of new safety precautions and considerations s/p acute physical changes from CVA and current recommendations for use of walker.   Care Partner:  Caregiver Able to Provide Assistance: Yes  Type of Caregiver Assistance: Cognitive;Physical  Recommendation:  24 hour supervision/assistance;Home Health SLP  Rationale for SLP Follow Up: Maximize cognitive function and independence;Reduce caregiver burden   Equipment:     Reasons for discharge: Treatment goals met;Discharged from hospital   Patient/Family Agrees with Progress Made and Goals Achieved: Yes    [name] T [name] 12/12/2021, 12:28 PM

## 2021-12-12 NOTE — Progress Notes (Signed)
Physical Therapy Session Note  Patient Details  Name: Kevin [name] MRN: [id number] Date of Birth: [birth year]  Today's Date: 12/12/2021 PT Individual Time: 0908-0950 PT Individual Time Calculation (min): 42 min   Short Term Goals: Week 1:  PT Short Term Goal 1 (Week 1): = to LTG's due to ELOS  Skilled Therapeutic Interventions/Progress Updates:   Pt received sitting in recliner and agreeable to PT. Pt performed ambulatory transfer with RW and CGA from PT for safety. Transported to bathroom following incontinent bowel and bladder. Sit<>stand x 4 in bathroom with UE support on rail with min assist to doff/don pants and brief. Mod assist from PT for time management. Ambulatory transfer to [facility] with RW and CGA for safety and cues for AD management over threshold into bathroom. Caren Macadam present for education.   Transported to gym in [facility]. Gait training in rehab gym with RW and CGA-supervision assist with education for proper guarding technique with gait belt. Stair management training to simulate access to kitchen and living space with RW and min assist for safety. Ascend 2 steps with assist from PT and daughter for AD management and cues safe guarding and imrpveod step to gait pattern. Car transfer training with RW and CGA for safety from PT and cues for sit>pivot to reduce fall risk. Patient returned to room and performed stand pivot to recliner with RW and CGA. Pt left sitting in recliner with call bell in reach and all needs met.          Therapy Documentation Precautions:  Precautions Precautions: Fall Precaution Comments: L hemiparesis Restrictions Weight Bearing Restrictions: No   Pain: Pain Assessment Pain Scale: 0-10 Pain Score: 0-No pain    Therapy/Group: Individual Therapy  [name] 12/12/2021, 10:38 AM

## 2021-12-12 NOTE — Plan of Care (Addendum)
  Problem: RH Problem Solving Goal: LTG Patient will demonstrate problem solving for (SLP) Description: LTG:  Patient will demonstrate problem solving for basic/complex daily situations with cues  (SLP) Outcome: Completed/Met   Problem: RH Memory Goal: LTG Patient will use memory compensatory aids to (SLP) Description: LTG:  Patient will use memory compensatory aids to recall biographical/new, daily complex information with cues (SLP) Outcome: Completed/Met   Problem: RH Awareness Goal: LTG: Patient will demonstrate awareness during functional activites type of (SLP) Description: LTG: Patient will demonstrate awareness during functional activites type of (SLP) Outcome: Completed/Met

## 2021-12-13 MED ORDER — FUROSEMIDE 20 MG PO TABS
20.0000 mg | ORAL_TABLET | Freq: Once | ORAL | Status: AC
  Administered 2021-12-13: 20 mg via ORAL
  Filled 2021-12-13: qty 1

## 2021-12-13 NOTE — Progress Notes (Signed)
Physical Therapy Session Note  Patient Details  Name: JACQUEZ SHEETZ MRN: [id number] Date of Birth: September 26, 1933  Today's Date: 12/13/2021 PT Individual Time: 6226-3335 PT Individual Time Calculation (min): 58 min   Short Term Goals: Week 2:   STG= LTG  Skilled Therapeutic Interventions/Progress Updates:    Chart reviewed and pt agreeable to therapy. Pt received seated in recliner with no c/o pain. Session focused on functional mobility training and assessment to promote safe home mobility and preparation for d/c. Pt completed each of the following tasks at the listed assist levels:  - Reaching for objects in base of support while seated with no hand support and feet supported: Independent  - Stand-step-turn transfer  to WC: Supervision + RW - Car transfer, 36ft amb on ramp, object retrieval from ground, and curb step: Supervision + RW - Traversing 12 steps: Supervision + B rails + supervision - Ambulating 6100ft: Supervision + RW  At end of session, pt was left seated in recliner with alarm engaged, nurse call bell and all needs in reach.     Therapy Documentation Precautions:  Precautions Precautions: Fall Precaution Comments: L hemiparesis Restrictions Weight Bearing Restrictions: No General:     Therapy/Group: Individual Therapy  Marquette Old, PT, DPT 12/13/2021, 4:09 PM

## 2021-12-13 NOTE — Progress Notes (Signed)
Patient likes to sleep in recliner. Assisted to bed at [place]. Bilateral pedal edema left > right. Requires some assistance to get from a sitting position to a standing position. Using RW and gait belt, gait unsteady at times. Wears incontinence brief from home, continent thus far tonight. LBM 10/14. Denies pain. Memory deficits. Bed and chair alarm in use to alert staff and to remind patient to call. Patrici Ranks A

## 2021-12-13 NOTE — Progress Notes (Signed)
PROGRESS NOTE   Subjective/Complaints: Nursing called me this morning to let me know that pedal edema seems worse than before. Discussed with patient and he feels it is stable. Echo reviewed and he has a great EF. Discussed trying 20 lasix today to see if it helps decrease swelling, also will help BP, and he is agreeable  Review of Systems  Constitutional:  Negative for chills, fever and malaise/fatigue.  HENT:  Negative for congestion.   Eyes:  Negative for double vision.  Respiratory:  Negative for cough and shortness of breath.   Cardiovascular:  Negative for chest pain.  Gastrointestinal:  Positive for constipation.  Genitourinary: Negative.   Musculoskeletal: Negative.   Neurological:  Positive for focal weakness.  Denies pain  Objective:   No results found. No results for input(s): "WBC", "HGB", "HCT", "PLT" in the last 72 hours.  No results for input(s): "NA", "K", "CL", "CO2", "GLUCOSE", "BUN", "CREATININE", "CALCIUM" in the last 72 hours.   Intake/Output Summary (Last 24 hours) at 12/13/2021 1133 Last data filed at 12/13/2021 0800 Gross per 24 hour  Intake 1074 ml  Output --  Net 1074 ml         Physical Exam: Vital Signs Blood pressure (!) 141/64, pulse 79, temperature 98.1 F (36.7 C), temperature source Oral, resp. rate 16, height 6\' 2"  (1.88 m), weight 75.2 kg, SpO2 100 %.   Gen: no distress, normal appearing HEENT: oral mucosa pink and moist, NCAT Cardio: Reg rate Chest: normal effort, normal rate of breathing Abd: soft, non-distended Ext: bilateral lower extremity edema 2+, pitting Psych: pleasant, normal affect Skin: warm and dry Neuro:  Alert and oriented to person, place and situation. Not oriented to date. Follows commands. Can name and repeat.  Slightly slow processing. CN 2-12 intact.  5/5 in right and 4+/5 left delt , bi, tri, grip, HF, KE, ADF No evidence of dysdiadochokinesis with  Rapid alt sup pronation of both forearms  Intact finger to thumb opposition     Assessment/Plan: 1. Functional deficits which require 3+ hours per day of interdisciplinary therapy in a comprehensive inpatient rehab setting. Physiatrist is providing close team supervision and 24 hour management of active medical problems listed below. Physiatrist and rehab team continue to assess barriers to discharge/monitor patient progress toward functional and medical goals  Care Tool:  Bathing    Body parts bathed by patient: Right arm, Left arm, Chest, Front perineal area, Abdomen, Right upper leg, Left upper leg, Face, Right lower leg, Left lower leg, Buttocks   Body parts bathed by helper: Buttocks     Bathing assist Assist Level: Contact Guard/Touching assist     Upper Body Dressing/Undressing Upper body dressing   What is the patient wearing?: Pull over shirt    Upper body assist Assist Level: Set up assist    Lower Body Dressing/Undressing Lower body dressing      What is the patient wearing?: Underwear/pull up, Pants     Lower body assist Assist for lower body dressing: Contact Guard/Touching assist     Toileting Toileting    Toileting assist Assist for toileting: Independent with assistive device Assistive Device Comment: walker   Transfers Chair/bed transfer  Transfers assist     Chair/bed transfer assist level: Contact Guard/Touching assist     Locomotion Ambulation   Ambulation assist      Assist level: Contact Guard/Touching assist Assistive device: Walker-rolling Max distance: 150'   Walk 10 feet activity   Assist     Assist level: Contact Guard/Touching assist Assistive device: Walker-rolling   Walk 50 feet activity   Assist Walk 50 feet with 2 turns activity did not occur: Safety/medical concerns  Assist level: Contact Guard/Touching assist Assistive device: Walker-rolling    Walk 150 feet activity   Assist Walk 150 feet activity  did not occur: Safety/medical concerns  Assist level: Contact Guard/Touching assist Assistive device: Walker-rolling    Walk 10 feet on uneven surface  activity   Assist Walk 10 feet on uneven surfaces activity did not occur: Safety/medical concerns   Assist level: Contact Guard/Touching assist Assistive device: Walker-rolling   Wheelchair     Assist Is the patient using a wheelchair?: Yes (transported pt to and from gym for time management) Type of Wheelchair: Manual    Wheelchair assist level: Dependent - Patient 0% Max wheelchair distance: 134ft    Wheelchair 50 feet with 2 turns activity    Assist        Assist Level: Dependent - Patient 0%   Wheelchair 150 feet activity     Assist      Assist Level: Dependent - Patient 0%   Blood pressure (!) 141/64, pulse 79, temperature 98.1 F (36.7 C), temperature source Oral, resp. rate 16, height 6\' 2"  (1.88 m), weight 75.2 kg, SpO2 100 %.  Medical Problem List and Plan: 1. Functional deficits secondary to right ischemic thalamic infarction             -patient may shower             -ELOS/Goals: 10/16 Min A with PT/OT, sup with SLP             -Continue CIR with PT/OT/SLP 2.  Antithrombotics: -DVT/anticoagulation:  Pharmaceutical: Heparin- switch to lovenox, pt has nl renal fxn             -antiplatelet therapy: Aspirin 81 mg daily and Plavix 75 mg daily x3 weeks (12/17/21) then aspirin alone 3. Pain Management: Tylenol as needed 4. Mood/Behavior/Sleep: Provide emotional support             -antipsychotic agents: N/A 5. Neuropsych/cognition: This patient is capable of making decisions on his own behalf. 6. Skin/Wound Care: Routine skin checks 7. Fluids/Electrolytes/Nutrition: Routine in and outs with follow-up chemistries 8.  Hypertension.  Norvasc 10 mg daily, hydralazine 25 mg every 8 hours.  Monitor with increased mobility. Avoid hypotension. Add magnesium gluconate 250mg  HS. Lasix 20mg   today   Vitals:   12/12/21 1948 12/13/21 0428  BP: 138/60 (!) 141/64  Pulse: 75 79  Resp: 18 16  Temp: 98.2 F (36.8 C) 98.1 F (36.7 C)  SpO2: 100% 100%    9.  Hyperlipidemia.  Lipitor. Heart healthy diet 10.  CAD/nonobstructive.  Continue aspirin Plavix.  No complaints of chest pain 11.  Constipation.  MiraLAX twice daily, Senokot nightly. Add magnesium gluconate 250mg  HS             -  Discussed with nursing last BM was 10/14 12. Poor dentition             -continue Dys 3 diet 13. Anemia- mild             -  HGB down a little to 10.9, recheck monday 14. Azotemia    -10/7 Bun down to 22, Cr 0.87 improved, continue to monitor 15. Bilateral lower extremity 2+ pitting edema. Echo reviewed and has normal EF. Discussed trying 20mg  Lasix today to see if it helps decrease swelling, will also lower BP     LOS: 11 days A FACE TO FACE EVALUATION WAS PERFORMED  P Geraldine Sandberg 12/13/2021, 11:33 AM

## 2021-12-13 NOTE — Progress Notes (Signed)
Occupational Therapy Session Note  Patient Details  Name: Kevin [name] MRN: [id number] Date of Birth: February 06, 1934  Today's Date: 12/13/2021 OT Individual Time: 9518-8416 OT Individual Time Calculation (min): 60 min    Short Term Goals: Week 2:  OT Short Term Goal 1 (Week 2): STG= LTGs  Skilled Therapeutic Interventions/Progress Updates:  Pt greeted in bathroom with NT present, pt agreeable to OT intervention. Session focus on BADL reeducation, functional mobility, dynamic standing balance BUE FME and decreasing overall caregiver burden. Pt completed 3/3 toileting tasks with CGA as pt insists on wearing belt and needs balance assist in standing to fasten belt and today needed assist to manage belt as pants were too big and belt needed to be adjusted. Pt completed functional ambulation out of bathroom with Rw and CGA and able to stand at sink with supervision for hand hygiene and grooming tasks. P  Pt completed functional ambulation to gym with rw and CGA. Pt completed below assessments for DC:   9 Hole Peg Test is used to measure finger dexterity in pts with various neurological diagnoses. - Instructions The pt was instructed to pick up the pegs one at a time, using their dominant hand first and put them into the holes in any order until the holes were all filled. The pt then removed the pegs one at a time and returned them to the container. Both hands were tested separately.  - Results The pt completed the test in LUE- 45 seconds, RUE- 1 min and 49 secs. Scores are based on the time taken to complete the activity. The timer started the moment the pt touched the first peg until the moment the last peg hit the container. Pts LUE score did improve in comparison to eval but RUE continues to be slower, ? Some carpal tunnel in RUE from over use, pt also endorses "tingly"   - Norms for healthy males ages 58-70+ 53-55 R 18.9 L 19.84 56-60 R 20.90 L 21.64 61-65 R 20.87 L 21.60 66-70 R  21.23 L 22.29 71+ R 25.79 L 25.95   The Dynamometer Grip Strength Test is a quantitative and objective measure of isometric muscular strength of the hand and forearm.  -Instructions The patient was asked to sit with their back, pelvis, and knees at 90 degrees. The shoulder was adducted and neutrally rotated with The elbow flexed to 90 degrees and forearm in neutral. The arm was not supported.  -Results The score was determined by calculating the average of 3 trials. The pt's average score was 40 lbs in the R hand and 56 lbs in the L hand. At eval pt scored 36 lbs in the R hand and 66 lbs in the L hand.   -Norms Males Average in lbs 55-59 R 101.1 L 83.2 60-64 R 89.7 L 76.8 65-69 R 91.1 L 76.8 70-74 R 75.3 L 64.8 75+ R 65.7 L 55.0  Remainder of session focus on standing balance/tolerance and BUE FMC. Pt first instructed to stand with Rw to complete [facility] task where pt instructed to recreate shape strcture pattern from visual aid provided using BUEs, pt completed all 3 structures with 100% accuracy, RUE noted to have mild coordinations deficits but overall functional. Next, pt instructed to use RUE only to grasp small plastic bugs from table in standing further assess pincer grasp. Pt unable to use pincer grasp on RUE with pt compensating by sliding items to edge of tabel and then gross grasping into R hand. Education provided on using  this technique as compensatory method as needed to RUE deficits.   Pt ambulated back to room with Rw and CGA. Pt left seated in recliner with all needs within reach and chair alarm activated.   Therapy Documentation Precautions:  Precautions Precautions: Fall Precaution Comments: L hemiparesis Restrictions Weight Bearing Restrictions: No   Pain: No pain    Therapy/Group: Individual Therapy  [name] 12/13/2021, 12:20 PM

## 2021-12-14 MED ORDER — HYDRALAZINE HCL 25 MG PO TABS: 25.0000 mg | ORAL_TABLET | Freq: Three times a day (TID) | ORAL | 0 refills | Status: AC

## 2021-12-14 MED ORDER — CLOPIDOGREL BISULFATE 75 MG PO TABS: 75.0000 mg | ORAL_TABLET | Freq: Every day | ORAL | 0 refills | Status: DC

## 2021-12-14 MED ORDER — PANTOPRAZOLE SODIUM 20 MG PO TBEC: 20.0000 mg | DELAYED_RELEASE_TABLET | Freq: Every day | ORAL | 0 refills | Status: DC

## 2021-12-14 MED ORDER — AMLODIPINE BESYLATE 10 MG PO TABS: 10.0000 mg | ORAL_TABLET | Freq: Every day | ORAL | 0 refills | Status: AC

## 2021-12-14 MED ORDER — ATORVASTATIN CALCIUM 40 MG PO TABS: 40.0000 mg | ORAL_TABLET | Freq: Every evening | ORAL | 0 refills | Status: AC

## 2021-12-14 MED ORDER — MAGNESIUM GLUCONATE 500 MG PO TABS: 250.0000 mg | ORAL_TABLET | Freq: Every day | ORAL | 0 refills | Status: DC

## 2021-12-14 MED ORDER — FUROSEMIDE 20 MG PO TABS
20.0000 mg | ORAL_TABLET | Freq: Once | ORAL | Status: AC
  Administered 2021-12-14: 20 mg via ORAL
  Filled 2021-12-14: qty 1

## 2021-12-14 NOTE — Progress Notes (Signed)
Inpatient Rehabilitation Discharge Medication Review by a Pharmacist  A complete drug regimen review was completed for this patient to identify any potential clinically significant medication issues.  High Risk Drug Classes Is patient taking? Indication by Medication  Antipsychotic No   Anticoagulant No   Antibiotic No   Opioid No   Antiplatelet Yes Aspirin and clopidogrel - stroke  Hypoglycemics/insulin No   Vasoactive Medication Yes Amlodipine, hydralazine - blood pressure  Chemotherapy No   Other Yes Atorvastatin - hyperlipidemia Magnesium gluconate - blood pressure Pantoprazole - GERD Senokot S scheduled and Miralax prn - laxatives Acetaminophen prn - mild pain     Type of Medication Issue Identified Description of Issue Recommendation(s)  Drug Interaction(s) (clinically significant)     Duplicate Therapy     Allergy     No Medication Administration End Date     Incorrect Dose     Additional Drug Therapy Needed     Significant med changes from prior encounter (inform family/care partners about these prior to discharge). Prior to [place] discontinued during acute care admission and not resuming.   Other       Clinically significant medication issues were identified that warrant physician communication and completion of prescribed/recommended actions by midnight of the next day:  No  Pharmacist comments:   -  Plavix to stop after 12/17/21 dose, 3 weeks. Then to continue Aspirin 81 mg daily alone.   Time spent performing this drug regimen review (minutes):  20 minutes  Arty Baumgartner, [place] 12/14/2021 7:44 AM

## 2021-12-14 NOTE — Progress Notes (Signed)
Bilateral pitting, pedal edema, left > right. Slept in recliner all night. Encouraged patient to get in bed and elevate BLE's to help with edema. Pt. Wanted to stay in [facility]. Patrici Ranks A

## 2021-12-14 NOTE — Progress Notes (Signed)
Inpatient Rehabilitation Care Coordinator Discharge Note   Patient Details  Name: Kevin [name] MRN: [id number] Date of Birth: 03/11/1933   Discharge location: D/c to home  Length of Stay: 11 days  Discharge activity level: Supervision to [employer]  Home/community participation: Limited  Patient response UU:[id number] Literacy - How often do you need to have someone help you when you read instructions, pamphlets, or other written material from your doctor or pharmacy?: Never  Patient response [id number]:ZPHXTA Isolation - How often do you feel lonely or isolated from those around you?: Never  Services provided included: MD, RD, PT, OT, SLP, CM, TR, Pharmacy, Neuropsych, SW, Financial risk analyst Services:  Charity fundraiser Utilized: [place] [employer]  Choices offered to/list presented to: Yes  Follow-up services arranged:  Home Health, DME Home Health Agency: [facility] for HHPT/OT/SLP    DME : [employer] for shower chair with back    Patient response to transportation need: Is the patient able to respond to transportation needs?: Yes In the past 12 months, has lack of transportation kept you from medical appointments or from getting medications?: No In the past 12 months, has lack of transportation kept you from meetings, work, or from getting things needed for daily living?: No   Comments (or additional information):  Patient/Family verbalized understanding of follow-up arrangements:  Yes  Individual responsible for coordination of the follow-up plan: contact pt  Confirmed correct DME delivered: Rana Snare 12/14/2021    Rana Snare

## 2021-12-14 NOTE — Progress Notes (Signed)
INPATIENT REHABILITATION DISCHARGE NOTE   Discharge instructions by:Dan [place]  Verbalized understanding:yes  Skin care/Wound care healing?none  Pain:none  IV's:none  Tubes/Drains:none  O2:none  Safety instructions:done  Patient belongings:done  Discharged EL:TRVU  Discharged YEB:[id number]  Notes:

## 2021-12-14 NOTE — Progress Notes (Signed)
PROGRESS NOTE   Subjective/Complaints:  Hx of chronic pedal edema , has TED hose but has not been donning  Normal ECHO   Review of Systems  Constitutional:  Negative for chills, fever and malaise/fatigue.  HENT:  Negative for congestion.   Eyes:  Negative for double vision.  Respiratory:  Negative for cough and shortness of breath.   Cardiovascular:  Negative for chest pain.  Gastrointestinal:  Positive for constipation.  Genitourinary: Negative.   Musculoskeletal: Negative.   Neurological:  Positive for focal weakness.  Denies pain  Objective:   No results found. No results for input(s): "WBC", "HGB", "HCT", "PLT" in the last 72 hours.  No results for input(s): "NA", "K", "CL", "CO2", "GLUCOSE", "BUN", "CREATININE", "CALCIUM" in the last 72 hours.   Intake/Output Summary (Last 24 hours) at 12/14/2021 0807 Last data filed at 12/14/2021 0247 Gross per 24 hour  Intake 717 ml  Output 1325 ml  Net -608 ml         Physical Exam: Vital Signs Blood pressure 128/63, pulse (!) 58, temperature (!) 97.5 F (36.4 C), temperature source Oral, resp. rate 17, height 6\' 2"  (1.88 m), weight 75.2 kg, SpO2 100 %.   Gen: no distress, normal appearing HEENT: oral mucosa pink and moist, NCAT Cardio: Reg rate Chest: normal effort, normal rate of breathing Abd: soft, non-distended Ext: bilateral lower extremity edema 2+, pitting at foot and ankle  Psych: pleasant, normal affect Skin: warm and dry Neuro:  Alert and oriented to person, place and situation. Not oriented to date. Follows commands. Can name and repeat.  Slightly slow processing. CN 2-12 intact.  5/5 in right and 4+/5 left delt , bi, tri, grip, HF, KE, ADF No evidence of dysdiadochokinesis with Rapid alt sup pronation of both forearms  Intact finger to thumb opposition     Assessment/Plan: 1. Functional deficits due to RIght thalamic infarct  Stable for D/C  today F/u PCP in 3-4 weeks F/u PM&R 2 weeks See D/C summary See D/C instructions  Care Tool:  Bathing    Body parts bathed by patient: Right arm, Left arm, Chest, Front perineal area, Abdomen, Right upper leg, Left upper leg, Face, Right lower leg, Left lower leg, Buttocks   Body parts bathed by helper: Buttocks     Bathing assist Assist Level: Supervision/Verbal cueing     Upper Body Dressing/Undressing Upper body dressing   What is the patient wearing?: Pull over shirt    Upper body assist Assist Level: Independent with assistive device    Lower Body Dressing/Undressing Lower body dressing      What is the patient wearing?: Underwear/pull up, Pants     Lower body assist Assist for lower body dressing: Contact Guard/Touching assist     Toileting Toileting    Toileting assist Assist for toileting: Contact Guard/Touching assist Assistive Device Comment: walker   Transfers Chair/bed transfer  Transfers assist     Chair/bed transfer assist level: Supervision/Verbal cueing     Locomotion Ambulation   Ambulation assist      Assist level: Supervision/Verbal cueing Assistive device: Walker-rolling Max distance: 600   Walk 10 feet activity   Assist  Assist level: Supervision/Verbal cueing Assistive device: Walker-rolling   Walk 50 feet activity   Assist Walk 50 feet with 2 turns activity did not occur: Safety/medical concerns  Assist level: Supervision/Verbal cueing Assistive device: Walker-rolling    Walk 150 feet activity   Assist Walk 150 feet activity did not occur: Safety/medical concerns  Assist level: Supervision/Verbal cueing Assistive device: Walker-rolling    Walk 10 feet on uneven surface  activity   Assist Walk 10 feet on uneven surfaces activity did not occur: Safety/medical concerns   Assist level: Supervision/Verbal cueing Assistive device: Walker-rolling   Wheelchair     Assist Is the patient using a  wheelchair?: No Type of Wheelchair: Manual    Wheelchair assist level: Dependent - Patient 0% Max wheelchair distance: 142ft    Wheelchair 50 feet with 2 turns activity    Assist        Assist Level: Dependent - Patient 0%   Wheelchair 150 feet activity     Assist      Assist Level: Dependent - Patient 0%   Blood pressure 128/63, pulse (!) 58, temperature (!) 97.5 F (36.4 C), temperature source Oral, resp. rate 17, height 6\' 2"  (1.88 m), weight 75.2 kg, SpO2 100 %.  Medical Problem List and Plan: 1. Functional deficits secondary to right ischemic thalamic infarction             -patient may shower             -ELOS/Goals: 10/16 Min A with PT/OT, sup with SLP             -Continue CIR with PT/OT/SLP 2.  Antithrombotics: -DVT/anticoagulation:  Pharmaceutical: Heparin- switch to lovenox, pt has nl renal fxn             -antiplatelet therapy: Aspirin 81 mg daily and Plavix 75 mg daily x3 weeks (12/17/21) then aspirin alone 3. Pain Management: Tylenol as needed 4. Mood/Behavior/Sleep: Provide emotional support             -antipsychotic agents: N/A 5. Neuropsych/cognition: This patient is capable of making decisions on his own behalf. 6. Skin/Wound Care: Routine skin checks 7. Fluids/Electrolytes/Nutrition: Routine in and outs with follow-up chemistries 8.  Hypertension.  Norvasc 10 mg daily, hydralazine 25 mg every 8 hours.  Monitor with increased mobility. Avoid hypotension. Add magnesium gluconate 250mg  HS. Lasix 20mg  today x 1 prior to d/c   Vitals:   12/13/21 1935 12/14/21 0432  BP: (!) 150/58 128/63  Pulse: 75 (!) 58  Resp: 16 17  Temp: 98.2 F (36.8 C) (!) 97.5 F (36.4 C)  SpO2: 99% 100%    9.  Hyperlipidemia.  Lipitor. Heart healthy diet 10.  CAD/nonobstructive.  Continue aspirin Plavix.  No complaints of chest pain 11.  Constipation.  MiraLAX twice daily, Senokot nightly. Add magnesium gluconate 250mg  HS             -  Discussed with nursing last  BM was 10/14 12. Poor dentition             -continue Dys 3 diet 13. Anemia- mild             -HGB down a little to 10.9, recheck monday 14. Azotemia    -10/7 Bun down to 22, Cr 0.87 improved, continue to monitor 15. Bilateral lower extremity 2+ pitting edema. Echo reviewed and has normal EF. Discussed trying 20mg  Lasix today to see if it helps decrease swelling, will also lower BP  LOS: 12 days A FACE TO [place] E Jarae Nemmers 12/14/2021, 8:07 AM

## 2021-12-15 ENCOUNTER — Telehealth: Payer: Self-pay

## 2021-12-15 NOTE — Telephone Encounter (Signed)
Transitional Care call--spoke w/ daughter Vaughan Basta    Are you/is patient experiencing any problems since coming home? Are there any questions regarding any aspect of care? no Are there any questions regarding medications administration/dosing? Are meds being taken as prescribed? Patient should review meds with caller to confirm no Have there been any falls? no   Has Home Health been to the house and/or have they contacted you? If not, have you tried to contact them? Can we help you contact them? no Are bowels and bladder emptying properly? Are there any unexpected incontinence issues? If applicable, is patient following bowel/bladder programs? No problems  Any fevers, problems with breathing, unexpected pain? no Are there any skin problems or new areas of breakdown? no Has the patient/family member arranged specialty MD follow up (ie cardiology/neurology/renal/surgical/etc)?  Can we help arrange? yes Does the patient need any other services or support that we can help arrange? no Are caregivers following through as expected in assisting the patient? yes Has the patient quit smoking, drinking alcohol, or using drugs as recommended? yes  Appointment time, arrive time and who it is with here [phone]

## 2021-12-17 DIAGNOSIS — Z8673 Personal history of transient ischemic attack (TIA), and cerebral infarction without residual deficits: Secondary | ICD-10-CM | POA: Diagnosis not present

## 2021-12-17 DIAGNOSIS — Z23 Encounter for immunization: Secondary | ICD-10-CM | POA: Diagnosis not present

## 2021-12-17 DIAGNOSIS — Z09 Encounter for follow-up examination after completed treatment for conditions other than malignant neoplasm: Secondary | ICD-10-CM | POA: Diagnosis not present

## 2021-12-17 NOTE — Progress Notes (Addendum)
Patient ID: Kevin [name], male   DOB: January 08, 1934, 88 y.o.   MRN: [id number]  12/17/2021  SW informed patient refused [facility] services ([place]) and patient will be considered a non-admit.

## 2021-12-17 NOTE — Progress Notes (Signed)
Physical Therapy Discharge Summary  Patient Details  Name: Kevin [name] MRN: [id number] Date of Birth: 1933/09/06  Date of Discharge from PT service:December 14, 2021        Patient has met 7 of 8 long term goals due to improved activity tolerance, improved balance, improved postural control, increased strength, ability to compensate for deficits, functional use of  left upper extremity and left lower extremity, improved attention, improved awareness, and improved coordination.  Patient to discharge at an ambulatory level Supervision.   Patient's care partner is independent to provide the necessary physical assistance at discharge.  Reasons goals not met:  Motor planning deficits limit independence from bed mobliity  Recommendation:  Patient will benefit from ongoing skilled PT services in home health setting to continue to advance safe functional mobility, address ongoing impairments in balance, mo\tor planning, safety awareness. strength, and minimize fall risk.  Equipment: No equipment provided  Reasons for discharge: treatment goals met and discharge from hospital  Patient/family agrees with progress made and goals achieved: Yes  PT Discharge Precautions/Restrictions Restrictions Weight Bearing Restrictions: No  Pain Interference Pain Interference Pain Effect on Sleep: 1. Rarely or not at all Pain Interference with Therapy Activities: 1. Rarely or not at all Pain Interference with Day-to-Day Activities: 1. Rarely or not at all Vision/Perception  Vision - History Ability to See in Adequate Light: 0 Adequate Perception Perception: Within Functional Limits Praxis Praxis: Intact  Sensation Sensation Light Touch: Impaired Detail Peripheral sensation comments: R palmar hand feels "doesnt have that touch feeling its supposed to have." Light Touch Impaired Details: Impaired RUE Hot/Cold: Appears Intact Proprioception: Not tested Stereognosis: Not tested Additional  Comments: Decreased sensation throughout L LE compared to R LE. Coordination Gross Motor Movements are Fluid and Coordinated: Yes Fine Motor Movements are Fluid and Coordinated: Yes Coordination and Movement Description: Decreased L LE coordination due to L hemi 9 Hole Peg Test: LUE: 45 secs RUE: 1 min 49 secs Motor  Motor Motor: Other (comment) (mild L hemi) Motor - Skilled Clinical Observations: Difficulty with moving and placing L foot when stepping during walking and with stand pivot transfers. Motor - Discharge Observations: Difficulty with moving and placing L foot when stepping during walking and with stand pivot transfers, but greatly improved from eval  Mobility Bed Mobility Bed Mobility: Supine to Sit;Sit to Supine Supine to Sit: Contact Guard/Touching assist Sit to Supine: Contact Guard/Touching assist Transfers Transfers: Sit to Stand;Stand to Sit;Stand Pivot Transfers Sit to Stand: Supervision/Verbal cueing Stand to Sit: Supervision/Verbal cueing Stand Pivot Transfers: Supervision/Verbal cueing Stand Pivot Transfer Details: Verbal cues for technique Transfer (Assistive device): Rolling walker Locomotion  Gait Ambulation: Yes Gait Assistance: Supervision/Verbal cueing Gait Distance (Feet): 150 Feet Gait Gait: Yes Gait Pattern: Impaired Gait Pattern: Decreased step length - right;Decreased step length - left;Decreased stride length;Decreased hip/knee flexion - right;Decreased hip/knee flexion - left;Decreased dorsiflexion - right;Decreased dorsiflexion - left;Poor foot clearance - left Stairs / Additional Locomotion Stairs: Yes Stairs Assistance: Supervision/Verbal cueing Stair Management Technique: Two rails Number of Stairs: 12 Wheelchair Mobility Wheelchair Mobility: No  Trunk/Postural Assessment  Cervical Assessment Cervical Assessment: Exceptions to [facility] (forward head) Thoracic Assessment Thoracic Assessment: Exceptions to [facility] (rounded shoulders) Lumbar  Assessment Lumbar Assessment: Exceptions to [facility] (posterior pelvic tilt) Postural Control Postural Control: Deficits on evaluation (delayed)  Balance Static Sitting Balance Static Sitting - Balance Support: No upper extremity supported;Feet supported Static Sitting - Level of Assistance: 7: Independent Dynamic Sitting Balance Dynamic Sitting - Balance Support: Feet supported;During  functional activity Dynamic Sitting - Level of Assistance: 5: Stand by assistance Static Standing Balance Static Standing - Balance Support: Bilateral upper extremity supported Static Standing - Level of Assistance: 6: Modified independent (Device/Increase time) Dynamic Standing Balance Dynamic Standing - Balance Support: During functional activity Dynamic Standing - Level of Assistance: 5: Stand by assistance Extremity Assessment      RLE Assessment RLE Assessment: Within Functional Limits Passive Range of Motion (PROM) Comments: Decreased R ankle DF LLE Assessment LLE Assessment: Exceptions to [facility] General Strength Comments: grossly 4/4 proximal to distal LLE Strength Left Ankle Dorsiflexion:  (limited ROM)   Kevin [name] 12/17/2021, 10:56 AM

## 2021-12-17 NOTE — Plan of Care (Signed)
  Problem: RH Bed Mobility Goal: LTG Patient will perform bed mobility with assist (PT) Description: LTG: Patient will perform bed mobility with assistance, with/without cues (PT). Outcome: Not Met (add Reason)   Problem: RH Balance Goal: LTG Patient will maintain dynamic sitting balance (PT) Description: LTG:  Patient will maintain dynamic sitting balance with assistance during mobility activities (PT) Outcome: Completed/Met Goal: LTG Patient will maintain dynamic standing balance (PT) Description: LTG:  Patient will maintain dynamic standing balance with assistance during mobility activities (PT) Outcome: Completed/Met   Problem: Sit to Stand Goal: LTG:  Patient will perform sit to stand with assistance level (PT) Description: LTG:  Patient will perform sit to stand with assistance level (PT) Outcome: Completed/Met   Problem: RH Bed to Chair Transfers Goal: LTG Patient will perform bed/chair transfers w/assist (PT) Description: LTG: Patient will perform bed to chair transfers with assistance (PT). Outcome: Completed/Met   Problem: RH Car Transfers Goal: LTG Patient will perform car transfers with assist (PT) Description: LTG: Patient will perform car transfers with assistance (PT). Outcome: Completed/Met   Problem: RH Ambulation Goal: LTG Patient will ambulate in controlled environment (PT) Description: LTG: Patient will ambulate in a controlled environment, # of feet with assistance (PT). Outcome: Completed/Met Goal: LTG Patient will ambulate in home environment (PT) Description: LTG: Patient will ambulate in home environment, # of feet with assistance (PT). Outcome: Completed/Met

## 2021-12-24 NOTE — Progress Notes (Signed)
Subjective:    Patient ID: Kevin [name], male    DOB: 1934/01/11, 88 y.o.   MRN: [id number]  HPI: Kevin [name] is a 88 y.o. male who is here for Transitional Care Visit for Follow up of his Right Thalamic Infarction, Essential Hypertension and Hyperlipidemia. He presented to [facility] on 11/25/2021,   Dr Marcello Moores: H&P Chief Complaint: left sided weakness   HPI: Kevin [name] is a 88 y.o. male with medical history significant of  HTN, CAD non obstructive, Anemia  who presents to ED with complaint of left sided weakness . Per patient this am on  waking around 1 am he felt he was not able to move his arm or left leg. He states he attempted to ambulate and fell.  He notes no associated HA, dizzy , chest pain , slurred speech, confusion, difficulty swallowing or prior symptoms like this in the past. ON further ros no fever/ chills/ n/v/cough or sob.   CT Head: WO Contrast:  : 1. No evidence of acute intracranial abnormality. 2. ASPECTS is 10.  CT: Angio:  IMPRESSION: 1. No emergent large vessel occlusion or proximal hemodynamically significant stenosis. 2. No evidence of core infarct or penumbra on CT perfusion.   MR: Cervical: IMPRESSION: 1. Multilevel cervical spondylosis. Multilevel foraminal stenosis due to spurring as described above. 2. Motion degraded study. No cord lesion identified.   MR: Thoracic:  IMPRESSION: Mild thoracic degenerative change most prominent at T10-11. No significant stenosis   Negative for thoracic fracture or mass lesion.  None no  MR: Lumbar: IMPRESSION: Lumbar spondylosis. Multilevel spinal and subarticular stenosis as above   Negative for fracture   MR: Brain: IMPRESSION: Acute infarct right lateral thalamus   Atrophy and moderate white matter changes consistent with chronic microvascular ischemia.   Kevin [name] was admitted to inpatient rehabilitation on 12/02/2021 and discharged home on 12/14/2021. He denies any pain at this time. He rated his  pain on health and history 3. He reports he has a good appetite. [facility] was called today, spoke with [place]. His daughter Elyse Hsu is aware of the above, she know [facility] will be calling her next week to schedule appointment. She verbalizes understanding.   Daughter in room, all questions answered.   Pain Inventory Average Pain 3 Pain Right Now 3 My pain is tingling  LOCATION OF PAIN  Fingers ( Right Hand)  BOWEL Number of stools per week: 2 Oral laxative use No  Type of laxative  Enema or suppository use No  History of colostomy No  Incontinent No   BLADDER Normal: On Water Pill  In and out cath, frequency  Able to self cath  Bladder incontinence No  Frequent urination No  Leakage with coughing No  Difficulty starting stream No  Incomplete bladder emptying      Mobility use a walker do you drive?  no  Function retired  Neuro/Psych numbness tingling trouble walking  Prior Studies   Physicians involved in your care Primary care   Neurologist Has a scheduled appointment with Dr Leonie Man    No family history on file. Social History   Socioeconomic History   Marital status: Widowed    Spouse name: Not on file   Number of children: Not on file   Years of education: Not on file   Highest education level: Not on file  Occupational History   Not on file  Tobacco Use   Smoking status: Never   Smokeless tobacco: Not on file  Vaping Use   Vaping Use: Never used  Substance and Sexual Activity   Alcohol use: Yes    Alcohol/week: 5.0 standard drinks of alcohol    Types: 5 Cans of beer per week   Drug use: No   Sexual activity: Not on file  Other Topics Concern   Not on file  Social History Narrative   Not on file   Social Determinants of Health   Financial Resource Strain: Not on file  Food Insecurity: No Food Insecurity (11/26/2021)   Hunger Vital Sign    Worried About Running Out of Food in the Last Year: Never true    Ran Out of  Food in the Last Year: Never true  Transportation Needs: No Transportation Needs (11/26/2021)   PRAPARE - Administrator, Civil Service (Medical): No    Lack of Transportation (Non-Medical): No  Physical Activity: Not on file  Stress: Not on file  Social Connections: Not on file   Past Surgical History:  Procedure Laterality Date   COLONOSCOPY WITH ESOPHAGOGASTRODUODENOSCOPY (EGD)     COLONOSCOPY WITH PROPOFOL N/A 09/27/2014   Procedure: COLONOSCOPY WITH PROPOFOL;  Surgeon: Scot Jun, MD;  Location: [facility] ENDOSCOPY;  Service: Endoscopy;  Laterality: N/A;   JOINT REPLACEMENT     Past Medical History:  Diagnosis Date   Anemia    Colon polyps    Coronary artery disease    Hematuria    History of hiatal hernia    Hypertension    There were no vitals taken for this visit.  Opioid Risk Score:   Fall Risk Score:  `1  Depression screen PHQ 2/9      No data to display            Review of Systems     Objective:   Physical Exam Vitals and nursing note reviewed.  Constitutional:      Appearance: Normal appearance.  Cardiovascular:     Rate and Rhythm: Normal rate and regular rhythm.     Pulses: Normal pulses.     Heart sounds: Normal heart sounds.  Pulmonary:     Effort: Pulmonary effort is normal.     Breath sounds: Normal breath sounds.  Musculoskeletal:     Cervical back: Normal range of motion and neck supple.     Right lower leg: Edema present.     Left lower leg: Edema present.     Comments: Normal Muscle Bulk and Muscle Testing Reveals:  Upper Extremities: Upper Extremities: Full ROM and Muscle Strength 5/5 Lower Extremities: Full ROM and Muscle Strength 5/5 Arises from Table Slowly using walker for support Narrow Based  Gait     Skin:    General: Skin is warm and dry.  Neurological:     Mental Status: He is alert and oriented to person, place, and time.  Psychiatric:        Mood and Affect: Mood normal.        Behavior: Behavior  normal.         Assessment & Plan:  Right Thalamic Infarction: Continue current medication regimen. He has a scheduled appointment with Dr Pearlean Brownie. [facility] Called today, they will be calling his daughter nbext week to scheduled a visit. Daughter is aware of the above and verbalizes understanding.  Essential Hypertension: Continue Current medication regimen. PCP Following. Continue to Monitor.  Hyperlipidemia.:L: Continue current medication regimen. PCP Following. Continue to Monitor.   F/U with Dr Wynn Banker in 4- 6 weeks .

## 2021-12-25 ENCOUNTER — Encounter: Payer: Self-pay | Admitting: Registered Nurse

## 2021-12-25 ENCOUNTER — Encounter: Payer: Medicare HMO | Attending: Registered Nurse | Admitting: Registered Nurse

## 2021-12-25 VITALS — BP 160/78 | HR 73 | Ht 72.2 in | Wt 166.2 lb

## 2021-12-25 DIAGNOSIS — E7849 Other hyperlipidemia: Secondary | ICD-10-CM | POA: Diagnosis not present

## 2021-12-25 DIAGNOSIS — I10 Essential (primary) hypertension: Secondary | ICD-10-CM

## 2021-12-25 DIAGNOSIS — I6381 Other cerebral infarction due to occlusion or stenosis of small artery: Secondary | ICD-10-CM | POA: Diagnosis not present

## 2022-01-14 ENCOUNTER — Encounter: Payer: Self-pay | Admitting: Neurology

## 2022-01-14 ENCOUNTER — Ambulatory Visit: Payer: Medicare HMO | Admitting: Neurology

## 2022-01-14 VITALS — BP 131/65 | HR 86 | Ht 74.6 in | Wt 163.8 lb

## 2022-01-14 DIAGNOSIS — G8194 Hemiplegia, unspecified affecting left nondominant side: Secondary | ICD-10-CM

## 2022-01-14 DIAGNOSIS — I6381 Other cerebral infarction due to occlusion or stenosis of small artery: Secondary | ICD-10-CM | POA: Diagnosis not present

## 2022-01-14 NOTE — Progress Notes (Signed)
[facility] [address] [place]. [place] [address] [phone]       OFFICE FOLLOW-UP NOTE  [name] Date of Birth:  02-15-1934 Medical Record Number:  [id number]   HPI: Kevin [name] is a pleasant 88 year old African-American male seen today for initial office follow-up visit following hospital admission for stroke and September 2023.  He is accompanied by his daughter.  History is obtained from them and review of electronic medical records and I have personally reviewed pertinent available imaging studies in PACS.  He has past medical history of hypertension, coronary artery disease, hiatal hernia EMEA.  He presented on 11/25/2021 with 1 day history of left-sided weakness, numbness and incoordination and gait difficulty.  He was found to have NIH stroke scale of 6.  He presented outside time window for thrombolysis.  MRI scan of the brain showed acute right thalamic infarct.  CT angiogram of the brain and neck showed no large vessel stenosis or occlusion.  2D echo showed ejection fraction of 70 to 75%.  EEG was normal.  LDL cholesterol was 98 mg percent. Hb A1c was 5.7.  Urine drug screen was negative.  MRI scan of the cervical and lumbar spine showed multifocal spondylitic changes. MRI scan of the thoracic spine showed only minor disc degenerative changes.  He was started on aspirin and Plavix and discharged home with home physical and occupational therapy but patient refused it.  He was previously walking with a cane but now has had to use a walker.  He can walk slowly but the still feels his left leg is heavy and weak.  He also has noticed diminished fine motor skills in the left hand.  Numbness has resolved.  He is tolerating Lipitor well without muscle aches and pains.  He is on aspirin minor bruising but no bleeding.  He has not had any recurrent stroke or TIA symptoms.  He lives at home with his daughter is mostly independent in most activities of daily living.    ROS:    14 system review of systems is positive for, numbness, gait imbalance, weakness and all other systems negative  PMH:  Past Medical History:  Diagnosis Date   Anemia    Colon polyps    Coronary artery disease    Hematuria    History of hiatal hernia    Hypertension     Social History:  Social History   Socioeconomic History   Marital status: Widowed    Spouse name: Not on file   Number of children: Not on file   Years of education: Not on file   Highest education level: Not on file  Occupational History   Not on file  Tobacco Use   Smoking status: Never   Smokeless tobacco: Not on file  Vaping Use   Vaping Use: Never used  Substance and Sexual Activity   Alcohol use: Yes    Alcohol/week: 5.0 standard drinks of alcohol    Types: 5 Cans of beer per week   Drug use: No   Sexual activity: Not on file  Other Topics Concern   Not on file  Social History Narrative   Not on file   Social Determinants of Health   Financial Resource Strain: Not on file  Food Insecurity: No Food Insecurity (11/26/2021)   Hunger Vital Sign    Worried About Running Out of Food in the Last Year: Never true    Ran Out of Food in the Last Year: Never true  Transportation Needs: No Transportation Needs (11/26/2021)   PRAPARE - Administrator, Civil Service (Medical): No    Lack of Transportation (Non-Medical): No  Physical Activity: Not on file  Stress: Not on file  Social Connections: Not on file  Intimate Partner Violence: Not At Risk (11/26/2021)   Humiliation, Afraid, Rape, and Kick questionnaire    Fear of Current or Ex-Partner: No    Emotionally Abused: No    Physically Abused: No    Sexually Abused: No    Medications:   Current Outpatient Medications on File Prior to Visit  Medication Sig Dispense Refill   amLODipine (NORVASC) 10 MG tablet Take 1 tablet (10 mg total) by mouth daily. 30 tablet 0   aspirin 81 MG chewable tablet Chew 1 tablet (81 mg total) by mouth daily.      atorvastatin (LIPITOR) 40 MG tablet Take 1 tablet (40 mg total) by mouth every evening. 30 tablet 0   hydrALAZINE (APRESOLINE) 25 MG tablet Take 1 tablet (25 mg total) by mouth every 8 (eight) hours. 90 tablet 0   hydrochlorothiazide (HYDRODIURIL) 12.5 MG tablet Take 12.5 mg by mouth daily.     No current facility-administered medications on file prior to visit.    Allergies:  No Known Allergies  Physical Exam General: Frail elderly African-American male, seated, in no evident distress Head: head normocephalic and atraumatic.  Neck: supple with no carotid or supraclavicular bruits Cardiovascular: regular rate and rhythm, soft ejection systolic murmur.   Musculoskeletal: no deformity mild kyphoscoliosis. Skin:  no rash/petichiae Vascular:  Normal pulses all extremities Vitals:   01/14/22 1426  BP: 131/65  Pulse: 86   Neurologic Exam Mental Status: Awake and fully alert. Oriented to place and time. Recent and remote memory intact. Attention span, concentration and fund of knowledge appropriate. Mood and affect appropriate.  Cranial Nerves: Fundoscopic exam reveals sharp disc margins. Pupils equal, briskly reactive to light. Extraocular movements full without nystagmus. Visual fields full to confrontation. Hearing intact. Facial sensation intact. Face, tongue, palate moves normally and symmetrically.  Motor: Normal bulk and tone. Normal strength in all tested extremity muscles except mild weakness of left grip and intrinsic hand muscles and left hip flexors and ankle dorsiflexors.  Diminished fine finger movements on the left.  Orbits right over left upper extremity.  Tone is increased in the left leg mild spasticity.. Sensory.: intact to touch ,pinprick .position and vibratory sensation.  Coordination: Rapid alternating movements normal in all extremities. Finger-to-nose and heel-to-shin performed accurately bilaterally. Gait and Station: Arises from chair without difficulty. Stance is  slightly stooped.  Walks using a walker with dragging of the left leg.  Reflexes: 1+ and asymmetric and slightly brisker on the left. Toes downgoing.   NIHSS  0 Modified Rankin  2   ASSESSMENT: 88 year old African-American male with right lateral thalamic lacunar infarct in September 2023 from small vessel disease.  He is doing well except for mild residual left hemiparesis.  Vascular risk factors of hyperlipidemia, hypertension and coronary artery disease     PLAN:I had a long d/w patient and his daughter about his recent thalamic lacunar stroke, risk for recurrent stroke/TIAs, personally independently reviewed imaging studies and stroke evaluation results and answered questions.Continue aspirin 81 mg daily  for secondary stroke prevention and maintain strict control of hypertension with blood pressure goal below 130/90, diabetes with hemoglobin A1c goal below 6.5% and lipids with LDL cholesterol goal below 70 mg/dL. I also advised the patient to eat a healthy  diet with plenty of whole grains, cereals, fruits and vegetables, exercise regularly and maintain ideal body weight .check follow-up lipid profile today.  I advised him to use his walker at all times and discussed fall prevention precautions.  Followup in the future with my nurse practitioner in 6 months or call earlier if necessary.Greater than 50% of time during this 35 minute visit was spent on counseling,explanation of diagnosis of lacunar stroke, planning of further management, discussion with patient and family and coordination of care Delia Heady, MD Note: This document was prepared with digital dictation and possible smart phrase technology. Any transcriptional errors that result from this process are unintentional

## 2022-01-14 NOTE — Patient Instructions (Signed)
I had a long d/w patient and his daughter about his recent thalamic lacunar stroke, risk for recurrent stroke/TIAs, personally independently reviewed imaging studies and stroke evaluation results and answered questions.Continue aspirin 81 mg daily  for secondary stroke prevention and maintain strict control of hypertension with blood pressure goal below 130/90, diabetes with hemoglobin A1c goal below 6.5% and lipids with LDL cholesterol goal below 70 mg/dL. I also advised the patient to eat a healthy diet with plenty of whole grains, cereals, fruits and vegetables, exercise regularly and maintain ideal body weight .check follow-up lipid profile today.  I advised him to use his walker at all times and discussed fall prevention precautions.  Followup in the future with my nurse practitioner in 6 months or call earlier if necessary.  Fall Prevention in the Home, Adult Falls can cause injuries and can happen to people of all ages. There are many things you can do to make your home safe and to help prevent falls. Ask for help when making these changes. What actions can I take to prevent falls? General Instructions Use good lighting in all rooms. Replace any light bulbs that burn out. Turn on the lights in dark areas. Use night-lights. Keep items that you use often in easy-to-reach places. Lower the shelves around your home if needed. Set up your furniture so you have a clear path. Avoid moving your furniture around. Do not have throw rugs or other things on the floor that can make you trip. Avoid walking on wet floors. If any of your floors are uneven, fix them. Add color or contrast paint or tape to clearly mark and help you see: Grab bars or handrails. First and last steps of staircases. Where the edge of each step is. If you use a stepladder: Make sure that it is fully opened. Do not climb a closed stepladder. Make sure the sides of the stepladder are locked in place. Ask someone to hold the  stepladder while you use it. Know where your pets are when moving through your home. What can I do in the bathroom?     Keep the floor dry. Clean up any water on the floor right away. Remove soap buildup in the tub or shower. Use nonskid mats or decals on the floor of the tub or shower. Attach bath mats securely with double-sided, nonslip rug tape. If you need to sit down in the shower, use a plastic, nonslip stool. Install grab bars by the toilet and in the tub and shower. Do not use towel bars as grab bars. What can I do in the bedroom? Make sure that you have a light by your bed that is easy to reach. Do not use any sheets or blankets for your bed that hang to the floor. Have a firm chair with side arms that you can use for support when you get dressed. What can I do in the kitchen? Clean up any spills right away. If you need to reach something above you, use a step stool with a grab bar. Keep electrical cords out of the way. Do not use floor polish or wax that makes floors slippery. What can I do with my stairs? Do not leave any items on the stairs. Make sure that you have a light switch at the top and the bottom of the stairs. Make sure that there are handrails on both sides of the stairs. Fix handrails that are broken or loose. Install nonslip stair treads on all your stairs. Avoid having  throw rugs at the top or bottom of the stairs. Choose a carpet that does not hide the edge of the steps on the stairs. Check carpeting to make sure that it is firmly attached to the stairs. Fix carpet that is loose or worn. What can I do on the outside of my home? Use bright outdoor lighting. Fix the edges of walkways and driveways and fix any cracks. Remove anything that might make you trip as you walk through a door, such as a raised step or threshold. Trim any bushes or trees on paths to your home. Check to see if handrails are loose or broken and that both sides of all steps have  handrails. Install guardrails along the edges of any raised decks and porches. Clear paths of anything that can make you trip, such as tools or rocks. Have leaves, snow, or ice cleared regularly. Use sand or salt on paths during winter. Clean up any spills in your garage right away. This includes grease or oil spills. What other actions can I take? Wear shoes that: Have a low heel. Do not wear high heels. Have rubber bottoms. Feel good on your feet and fit well. Are closed at the toe. Do not wear open-toe sandals. Use tools that help you move around if needed. These include: Canes. Walkers. Scooters. Crutches. Review your medicines with your doctor. Some medicines can make you feel dizzy. This can increase your chance of falling. Ask your doctor what else you can do to help prevent falls. Where to find more information Centers for Disease Control and Prevention, [employer]: http://www.wolf.info/ National Institute on Aging: http://kim-miller.com/ Contact a doctor if: You are afraid of falling at home. You feel weak, drowsy, or dizzy at home. You fall at home. Summary There are many simple things that you can do to make your home safe and to help prevent falls. Ways to make your home safe include removing things that can make you trip and installing grab bars in the bathroom. Ask for help when making these changes in your home. This information is not intended to replace advice given to you by your health care provider. Make sure you discuss any questions you have with your health care provider. Document Revised: 11/17/2020 Document Reviewed: 09/19/2019 Elsevier Patient Education  [place].  Stroke Prevention Some medical conditions and behaviors can lead to a higher chance of having a stroke. You can help prevent a stroke by eating healthy, exercising, not smoking, and managing any medical conditions you have. Stroke is a leading cause of functional impairment. Primary prevention is  particularly important because a majority of strokes are first-time events. Stroke changes the lives of not only those who experience a stroke but also their family and other caregivers. How can this condition affect me? A stroke is a medical emergency and should be treated right away. A stroke can lead to brain damage and can sometimes be life-threatening. If a person gets medical treatment right away, there is a better chance of surviving and recovering from a stroke. What can increase my risk? The following medical conditions may increase your risk of a stroke: Cardiovascular disease. High blood pressure (hypertension). Diabetes. High cholesterol. Sickle cell disease. Blood clotting disorders (hypercoagulable state). Obesity. Sleep disorders (obstructive sleep apnea). Other risk factors include: Being older than age 27. Having a history of blood clots, stroke, or mini-stroke (transient ischemic attack, TIA). Genetic factors, such as race, ethnicity, or a family history of stroke. Smoking cigarettes or using other  tobacco products. Taking birth control pills, especially if you also use tobacco. Heavy use of alcohol or drugs, especially cocaine and methamphetamine. Physical inactivity. What actions can I take to prevent this? Manage your health conditions High cholesterol levels. Eating a healthy diet is important for preventing high cholesterol. If cholesterol cannot be managed through diet alone, you may need to take medicines. Take any prescribed medicines to control your cholesterol as told by your health care provider. Hypertension. To reduce your risk of stroke, try to keep your blood pressure below 130/80. Eating a healthy diet and exercising regularly are important for controlling blood pressure. If these steps are not enough to manage your blood pressure, you may need to take medicines. Take any prescribed medicines to control hypertension as told by your health care  provider. Ask your health care provider if you should monitor your blood pressure at home. Have your blood pressure checked every year, even if your blood pressure is normal. Blood pressure increases with age and some medical conditions. Diabetes. Eating a healthy diet and exercising regularly are important parts of managing your blood sugar (glucose). If your blood sugar cannot be managed through diet and exercise, you may need to take medicines. Take any prescribed medicines to control your diabetes as told by your health care provider. Get evaluated for obstructive sleep apnea. Talk to your health care provider about getting a sleep evaluation if you snore a lot or have excessive sleepiness. Make sure that any other medical conditions you have, such as atrial fibrillation or atherosclerosis, are managed. Nutrition Follow instructions from your health care provider about what to eat or drink to help manage your health condition. These instructions may include: Reducing your daily calorie intake. Limiting how much salt (sodium) you use to 1,500 milligrams (mg) each day. Using only healthy fats for cooking, such as olive oil, canola oil, or sunflower oil. Eating healthy foods. You can do this by: Choosing foods that are high in fiber, such as whole grains, and fresh fruits and vegetables. Eating at least 5 servings of fruits and vegetables a day. Try to fill one-half of your plate with fruits and vegetables at each meal. Choosing lean protein foods, such as lean cuts of meat, poultry without skin, fish, tofu, beans, and nuts. Eating low-fat dairy products. Avoiding foods that are high in sodium. This can help lower blood pressure. Avoiding foods that have saturated fat, trans fat, and cholesterol. This can help prevent high cholesterol. Avoiding processed and prepared foods. Counting your daily carbohydrate intake.  Lifestyle If you drink alcohol: Limit how much you have to: 0-1 drink a day  for women who are not pregnant. 0-2 drinks a day for men. Know how much alcohol is in your drink. In the [place] one drink equals one 12 oz bottle of beer (35mL), one 5 oz glass of wine (161mL), or one 1 oz glass of hard liquor (33mL). Do not use any products that contain nicotine or tobacco. These products include cigarettes, chewing tobacco, and vaping devices, such as e-cigarettes. If you need help quitting, ask your health care provider. Avoid secondhand smoke. Do not use drugs. Activity  Try to stay at a healthy weight. Get at least 30 minutes of exercise on most days, such as: Fast walking. Biking. Swimming. Medicines Take over-the-counter and prescription medicines only as told by your health care provider. Aspirin or blood thinners (antiplatelets or anticoagulants) may be recommended to reduce your risk of forming blood clots that can lead to  stroke. Avoid taking birth control pills. Talk to your health care provider about the risks of taking birth control pills if: You are over 48 years old. You smoke. You get very bad headaches. You have had a blood clot. Where to find more information American Stroke Association: www.strokeassociation.org Get help right away if: You or a loved one has any symptoms of a stroke. "BE FAST" is an easy way to remember the main warning signs of a stroke: B - Balance. Signs are dizziness, sudden trouble walking, or loss of balance. E - Eyes. Signs are trouble seeing or a sudden change in vision. F - Face. Signs are sudden weakness or numbness of the face, or the face or eyelid drooping on one side. A - Arms. Signs are weakness or numbness in an arm. This happens suddenly and usually on one side of the body. S - Speech. Signs are sudden trouble speaking, slurred speech, or trouble understanding what people say. T - Time. Time to call emergency services. Write down what time symptoms started. You or a loved one has other signs of a stroke, such as: A  sudden, severe headache with no known cause. Nausea or vomiting. Seizure. These symptoms may represent a serious problem that is an emergency. Do not wait to see if the symptoms will go away. Get medical help right away. Call your local emergency services (911 in the [place] Do not drive yourself to the hospital. Summary You can help to prevent a stroke by eating healthy, exercising, not smoking, limiting alcohol intake, and managing any medical conditions you may have. Do not use any products that contain nicotine or tobacco. These include cigarettes, chewing tobacco, and vaping devices, such as e-cigarettes. If you need help quitting, ask your health care provider. Remember "BE FAST" for warning signs of a stroke. Get help right away if you or a loved one has any of these signs. This information is not intended to replace advice given to you by your health care provider. Make sure you discuss any questions you have with your health care provider. Document Revised: 09/17/2019 Document Reviewed: 09/17/2019 Elsevier Patient Education  2023 [employer].

## 2022-01-15 LAB — LIPID PANEL
Chol/HDL Ratio: 2.1 ratio (ref 0.0–5.0)
Cholesterol, Total: 103 mg/dL (ref 100–199)
HDL: 49 mg/dL (ref >39)
LDL Chol Calc (NIH): 42 mg/dL (ref 0–99)
Triglycerides: 47 mg/dL (ref 0–149)
VLDL Cholesterol Cal: 12 mg/dL (ref 5–40)

## 2022-01-15 NOTE — Progress Notes (Signed)
Kindly inform the patient lab lipid profile is all quite satisfactory.

## 2022-01-18 NOTE — Progress Notes (Signed)
Attempted to call pt, LVM for normal results per DPR. Ask pt to call back for questions or concerns.  

## 2022-01-20 DIAGNOSIS — R319 Hematuria, unspecified: Secondary | ICD-10-CM | POA: Diagnosis not present

## 2022-01-20 DIAGNOSIS — I10 Essential (primary) hypertension: Secondary | ICD-10-CM | POA: Diagnosis not present

## 2022-01-28 DIAGNOSIS — I10 Essential (primary) hypertension: Secondary | ICD-10-CM | POA: Diagnosis not present

## 2022-01-28 DIAGNOSIS — I2510 Atherosclerotic heart disease of native coronary artery without angina pectoris: Secondary | ICD-10-CM | POA: Diagnosis not present

## 2022-02-09 ENCOUNTER — Encounter: Payer: Self-pay | Admitting: Physical Medicine & Rehabilitation

## 2022-02-09 ENCOUNTER — Encounter: Payer: Medicare HMO | Attending: Registered Nurse | Admitting: Physical Medicine & Rehabilitation

## 2022-02-09 VITALS — BP 149/71 | HR 90 | Ht 72.6 in | Wt 167.2 lb

## 2022-02-09 DIAGNOSIS — I6381 Other cerebral infarction due to occlusion or stenosis of small artery: Secondary | ICD-10-CM | POA: Insufficient documentation

## 2022-02-09 NOTE — Patient Instructions (Signed)
Shoulder Impingement Syndrome Rehab Ask your health care provider which exercises are safe for you. Do exercises exactly as told by your health care provider and adjust them as directed. It is normal to feel mild stretching, pulling, tightness, or discomfort as you do these exercises. Stop right away if you feel sudden pain or your pain gets worse. Do not begin these exercises until told by your health care provider. Stretching and range-of-motion exercise This exercise warms up your muscles and joints and improves the movement and flexibility of your shoulder. This exercise also helps to relieve pain and stiffness. Passive horizontal adduction In passive adduction, you use your other hand to move the injured arm toward your body. The injured arm does not move on its own. In this movement, your arm is moved across your body in the horizontal plane (horizontal adduction). Sit or stand and pull your left / right elbow across your chest, toward your other shoulder. Stop when you feel a gentle stretch in the back of your shoulder and upper arm. Keep your arm at shoulder height. Keep your arm as close to your body as you comfortably can. Hold for __________ seconds. Slowly return to the starting position. Repeat __________ times. Complete this exercise __________ times a day. Strengthening exercises These exercises build strength and endurance in your shoulder. Endurance is the ability to use your muscles for a long time, even after they get tired. External rotation, isometric This is an exercise in which you press the back of your wrist against a door frame without moving your shoulder joint (isometric). Stand or sit in a doorway, facing the door frame. Bend your left / right elbow and place the back of your wrist against the door frame. Only the back of your wrist should be touching the frame. Keep your upper arm at your side. Gently press your wrist against the door frame, as if you are trying to  push your arm away from your abdomen (external rotation). Press as hard as you are able without pain. Avoid shrugging your shoulder while you press your wrist against the door frame. Keep your shoulder blade tucked down toward the middle of your back. Hold for __________ seconds. Slowly release the tension, and relax your muscles completely before you repeat the exercise. Repeat __________ times. Complete this exercise __________ times a day. Internal rotation, isometric This is an exercise in which you press your palm against a door frame without moving your shoulder joint (isometric). Stand or sit in a doorway, facing the door frame. Bend your left / right elbow and place the palm of your hand against the door frame. Only your palm should be touching the frame. Keep your upper arm at your side. Gently press your hand against the door frame, as if you are trying to push your arm toward your abdomen (internal rotation). Press as hard as you are able without pain. Avoid shrugging your shoulder while you press your hand against the door frame. Keep your shoulder blade tucked down toward the middle of your back. Hold for __________ seconds. Slowly release the tension, and relax your muscles completely before you repeat the exercise. Repeat __________ times. Complete this exercise __________ times a day. Scapular protraction, supine  Lie on your back on a firm surface (supine position). Hold a __________ weight in your left / right hand. Raise your left / right arm straight into the air so your hand is directly above your shoulder joint. Push the weight into the air so   your shoulder (scapula) lifts off the surface that you are lying on. The scapula will push up or forward (protraction). Do not move your head, neck, or back. Hold for __________ seconds. Slowly return to the starting position. Let your muscles relax completely before you repeat this exercise. Repeat __________ times. Complete this  exercise __________ times a day. Scapular retraction  Sit in a stable chair without armrests, or stand up. Secure an exercise band to a stable object in front of you so the band is at shoulder height. Hold one end of the exercise band in each hand. Your palms should face down. Squeeze your shoulder blades together (retraction) and move your elbows slightly behind you. Do not shrug your shoulders upward while you do this. Hold for __________ seconds. Slowly return to the starting position. Repeat __________ times. Complete this exercise __________ times a day. Shoulder extension  Sit in a stable chair without armrests, or stand up. Secure an exercise band to a stable object in front of you so the band is above shoulder height. Hold one end of the exercise band in each hand. Straighten your elbows and lift your hands up to shoulder height. Squeeze your shoulder blades together and pull your hands down to the sides of your thighs (extension). Stop when your hands are straight down by your sides. Do not let your hands go behind your body. Hold for __________ seconds. Slowly return to the starting position. Repeat __________ times. Complete this exercise __________ times a day. This information is not intended to replace advice given to you by your health care provider. Make sure you discuss any questions you have with your health care provider. Document Revised: 06/09/2018 Document Reviewed: 03/13/2018 Elsevier Patient Education  2023 [employer] Inc.  

## 2022-02-09 NOTE — Progress Notes (Signed)
Subjective:    Patient ID: Kevin [name], male    DOB: 1933-08-13, 88 y.o.   MRN: [id number] 88 y.o. right-handed male with history of hypertension, CAD/nonobstructive, chronic anemia as well as recent oral surgery.  Per chart review lives with adult aged grandchildren.  Independent with occasional cane.  Presented to [facility] 11/25/2021 with acute onset of left-sided weakness transient confusion resulting in a fall.  CT/MRI showed acute infarction right lateral thalamus.  Patient did not receive tPA.  CT angiogram head and neck no emergent large vessel occlusion.  MRI cervical thoracic lumbar spine showed no acute changes.  No significant stenosis.  EEG negative for seizure.  Echocardiogram with ejection fraction of 70 to 75% no wall motion abnormalities.  Admission chemistries unremarkable.  Neurology follow-up maintained on low-dose aspirin as well as Plavix for CVA prophylaxis then aspirin alone.  Subcutaneous heparin for DVT prophylaxis.  Therapy evaluations completed due to patient's left-sided weakness decreased functional mobility was admitted for a comprehensive rehab program.  HPI  Patient is here with his daughter who drove him today.  He has not had any falls. He is doing some exercise at home such as riding a stationary bike.  He has seen his primary care physician as well as neurology. He is able to climb steps.  He no longer drives.  He is retired. Uses walker or quad cane for ambulation, will pull a wagon with firewood  Pt declined HH therapy Mod I dressing and bathing  Is cooking Mod I Pain Inventory  No pains except pushing up from sitting position (right shoulder) Average Pain 2 Pain Right Now 2 My pain is tingling  LOCATION OF PAIN  hand and fingers  BOWEL Number of stools per week: 2  BLADDER Normal  Mobility walk without assistance use a walker how many minutes can you walk? 5 ability to climb steps?  yes do you drive?  no needs help with  transfers  Function retired  Neuro/Psych tingling  Prior Studies Any changes since last visit?  no  Physicians involved in your care Any changes since last visit?  no   No family history on file. Social History   Socioeconomic History   Marital status: Widowed    Spouse name: Not on file   Number of children: Not on file   Years of education: Not on file   Highest education level: Not on file  Occupational History   Not on file  Tobacco Use   Smoking status: Never   Smokeless tobacco: Not on file  Vaping Use   Vaping Use: Never used  Substance and Sexual Activity   Alcohol use: Yes    Alcohol/week: 5.0 standard drinks of alcohol    Types: 5 Cans of beer per week   Drug use: No   Sexual activity: Not on file  Other Topics Concern   Not on file  Social History Narrative   Not on file   Social Determinants of Health   Financial Resource Strain: Not on file  Food Insecurity: No Food Insecurity (11/26/2021)   Hunger Vital Sign    Worried About Running Out of Food in the Last Year: Never true    Ran Out of Food in the Last Year: Never true  Transportation Needs: No Transportation Needs (11/26/2021)   PRAPARE - Administrator, Civil Service (Medical): No    Lack of Transportation (Non-Medical): No  Physical Activity: Not on file  Stress: Not on file  Social  Connections: Not on file   Past Surgical History:  Procedure Laterality Date   COLONOSCOPY WITH ESOPHAGOGASTRODUODENOSCOPY (EGD)     COLONOSCOPY WITH PROPOFOL N/A 09/27/2014   Procedure: COLONOSCOPY WITH PROPOFOL;  Surgeon: Scot Jun, MD;  Location[facility] ENDOSCOPY;  Service: Endoscopy;  Laterality: N/A;   JOINT REPLACEMENT     Past Medical History:  Diagnosis Date   Anemia    Colon polyps    Coronary artery disease    Hematuria    History of hiatal hernia    Hypertension    BP (!) 149/71   Pulse 90   Ht 6' 0.6" (1.844 m)   Wt 167 lb 3.2 oz (75.8 kg)   SpO2 97%   BMI 22.30 kg/m    Opioid Risk Score:   Fall Risk Score:  `1  Depression screen [facility] 2/9     02/09/2022   10:23 AM  Depression screen PHQ 2/9  Decreased Interest 0  Down, Depressed, Hopeless 0  PHQ - 2 Score 0     Review of Systems  Constitutional: Negative.   HENT: Negative.    Eyes: Negative.   Respiratory: Negative.    Cardiovascular: Negative.   Gastrointestinal: Negative.   Endocrine: Negative.   Genitourinary: Negative.   Musculoskeletal: Negative.   Skin: Negative.   Allergic/Immunologic: Negative.   Neurological:        Tingling  Hematological: Negative.   Psychiatric/Behavioral: Negative.    All other systems reviewed and are negative.      Objective:   Physical Exam   General no acute distress Mood and affect are appropriate Ambulates with a walker he has bilateral valgus deformities at the knee mostly on the right. Extremities without edema except at bilateral ankles with dependent edema just above the sock line Motor strength is 4/5 bilateral deltoid bicep tricep grip hip flexor knee extensor ankle dorsiflexor Sensation intact to light touch bilaterally but feels a little different on the left side than on the right side. Speech without dysarthria or aphasia. Tone is normal bilateral upper and lower limbs No evidence of ataxia     Assessment & Plan:   Right thalamic infarct no significant motor deficits have mild sensory deficits as well as some balance issues but overall functioning at a modified independent level.  Do not think he needs any further formal rehab.  He is to continue his home exercises which include stationary bicycling as well as house chores. Follow-up with PCP Follow-up with neurology Physical medicine rehab follow-up as needed basis

## 2022-07-19 NOTE — Progress Notes (Unsigned)
No chief complaint on file.   HISTORY OF PRESENT ILLNESS:  07/19/22 ALL:  Kevin [name] is a 89 y.o. male here today for follow up for history of right thalamic infarct 10/2021. He was last seen by Kevin Kevin [name] 12/2021 and doing fairly well.   He continues atorvastatin 40mg  and asa 81mg  daily. Followed regularly by PCP. LDL well controlled 12/2021. BP is usually   Left weakness? He was last seen by Kevin Kevin [name] 01/2022 and doing well. No follow up recommended.   HISTORY (copied from Kevin [name] previous note)   HPI: Kevin [name] is a pleasant 89 year old African-American male seen today for initial office follow-up visit following hospital admission for stroke and September 2023.  He is accompanied by his daughter.  History is obtained from them and review of electronic medical records and I have personally reviewed pertinent available imaging studies in PACS.  He has past medical history of hypertension, coronary artery disease, hiatal hernia EMEA.  He presented on 11/25/2021 with 1 day history of left-sided weakness, numbness and incoordination and gait difficulty.  He was found to have NIH stroke scale of 6.  He presented outside time window for thrombolysis.  MRI scan of the brain showed acute right thalamic infarct.  CT angiogram of the brain and neck showed no large vessel stenosis or occlusion.  2D echo showed ejection fraction of 70 to 75%.  EEG was normal.  LDL cholesterol was 98 mg percent. Hb A1c was 5.7.  Urine drug screen was negative.  MRI scan of the cervical and lumbar spine showed multifocal spondylitic changes. MRI scan of the thoracic spine showed only minor disc degenerative changes.  He was started on aspirin and Plavix and discharged home with home physical and occupational therapy but patient refused it.  He was previously walking with a cane but now has had to use a walker.  He can walk slowly but the still feels his left leg is heavy and weak.  He also has noticed diminished fine  motor skills in the left hand.  Numbness has resolved.  He is tolerating Lipitor well without muscle aches and pains.  He is on aspirin minor bruising but no bleeding.  He has not had any recurrent stroke or TIA symptoms.  He lives at home with his daughter is mostly independent in most activities of daily living.    REVIEW OF SYSTEMS: Out of a complete 14 system review of symptoms, the patient complains only of the following symptoms, and all other reviewed systems are negative.   ALLERGIES: No Known Allergies   HOME MEDICATIONS: Outpatient Medications Prior to Visit  Medication Sig Dispense Refill   amLODipine (NORVASC) 10 MG tablet Take 1 tablet (10 mg total) by mouth daily. 30 tablet 0   aspirin 81 MG chewable tablet Chew 1 tablet (81 mg total) by mouth daily.     atorvastatin (LIPITOR) 40 MG tablet Take 1 tablet (40 mg total) by mouth every evening. 30 tablet 0   hydrALAZINE (APRESOLINE) 25 MG tablet Take 1 tablet (25 mg total) by mouth every 8 (eight) hours. 90 tablet 0   hydrochlorothiazide (HYDRODIURIL) 12.5 MG tablet Take 12.5 mg by mouth daily.     No facility-administered medications prior to visit.     PAST MEDICAL HISTORY: Past Medical History:  Diagnosis Date   Anemia    Colon polyps    Coronary artery disease    Hematuria    History of hiatal hernia  Hypertension      PAST SURGICAL HISTORY: Past Surgical History:  Procedure Laterality Date   COLONOSCOPY WITH ESOPHAGOGASTRODUODENOSCOPY (EGD)     COLONOSCOPY WITH PROPOFOL N/A 09/27/2014   Procedure: COLONOSCOPY WITH PROPOFOL;  Surgeon: Kevin Jun, MD;  Location: [facility] ENDOSCOPY;  Service: Endoscopy;  Laterality: N/A;   JOINT REPLACEMENT       FAMILY HISTORY: No family history on file.   SOCIAL HISTORY: Social History   Socioeconomic History   Marital status: Widowed    Spouse name: Not on file   Number of children: Not on file   Years of education: Not on file   Highest education level: Not  on file  Occupational History   Not on file  Tobacco Use   Smoking status: Never   Smokeless tobacco: Not on file  Vaping Use   Vaping Use: Never used  Substance and Sexual Activity   Alcohol use: Yes    Alcohol/week: 5.0 standard drinks of alcohol    Types: 5 Cans of beer per week   Drug use: No   Sexual activity: Not on file  Other Topics Concern   Not on file  Social History Narrative   Not on file   Social Determinants of Health   Financial Resource Strain: Not on file  Food Insecurity: No Food Insecurity (11/26/2021)   Hunger Vital Sign    Worried About Running Out of Food in the Last Year: Never true    Ran Out of Food in the Last Year: Never true  Transportation Needs: No Transportation Needs (11/26/2021)   PRAPARE - Administrator, Civil Service (Medical): No    Lack of Transportation (Non-Medical): No  Physical Activity: Not on file  Stress: Not on file  Social Connections: Not on file  Intimate Partner Violence: Not At Risk (11/26/2021)   Humiliation, Afraid, Rape, and Kick questionnaire    Fear of Current or Ex-Partner: No    Emotionally Abused: No    Physically Abused: No    Sexually Abused: No     PHYSICAL EXAM  There were no vitals filed for this visit. There is no height or weight on file to calculate BMI.  Generalized: Well developed, in no acute distress  Cardiology: normal rate and rhythm, no murmur auscultated  Respiratory: clear to auscultation bilaterally    Neurological examination  Mentation: Alert oriented to time, place, history taking. Follows all commands speech and language fluent Cranial nerve II-XII: Pupils were equal round reactive to light. Extraocular movements were full, visual field were full on confrontational test. Facial sensation and strength were normal. Uvula tongue midline. Head turning and shoulder shrug  were normal and symmetric. Motor: The motor testing reveals 5 over 5 strength of all 4 extremities. Good  symmetric motor tone is noted throughout.  Sensory: Sensory testing is intact to soft touch on all 4 extremities. No evidence of extinction is noted.  Coordination: Cerebellar testing reveals good finger-nose-finger and heel-to-shin bilaterally.  Gait and station: Gait is normal. Tandem gait is normal. Romberg is negative. No drift is seen.  Reflexes: Deep tendon reflexes are symmetric and normal bilaterally.    DIAGNOSTIC DATA (LABS, IMAGING, TESTING) - I reviewed patient records, labs, notes, testing and imaging myself where available.  Lab Results  Component Value Date   WBC 5.7 12/03/2021   HGB 10.9 (L) 12/03/2021   HCT 33.5 (L) 12/03/2021   MCV 84.4 12/03/2021   PLT 311 12/03/2021      Component  Value Date/Time   NA 140 12/05/2021 0621   NA 140 12/23/2012 0415   K 3.9 12/05/2021 0621   K 3.6 12/23/2012 0415   CL 106 12/05/2021 0621   CL 107 12/23/2012 0415   CO2 24 12/05/2021 0621   CO2 29 12/23/2012 0415   GLUCOSE 101 (H) 12/05/2021 0621   GLUCOSE 105 (H) 12/23/2012 0415   BUN 22 12/05/2021 0621   BUN 7 12/23/2012 0415   CREATININE 0.87 12/05/2021 0621   CREATININE 1.09 12/23/2012 0415   CALCIUM 9.4 12/05/2021 0621   CALCIUM 8.4 (L) 12/23/2012 0415   PROT 6.1 (L) 12/03/2021 0536   ALBUMIN 2.9 (L) 12/03/2021 0536   AST 15 12/03/2021 0536   ALT 16 12/03/2021 0536   ALKPHOS 52 12/03/2021 0536   BILITOT 0.4 12/03/2021 0536   GFRNONAA >60 12/05/2021 0621   GFRNONAA >60 12/23/2012 0415   GFRAA >60 12/23/2012 0415   Lab Results  Component Value Date   CHOL 103 01/14/2022   HDL 49 01/14/2022   LDLCALC 42 01/14/2022   TRIG 47 01/14/2022   CHOLHDL 2.1 01/14/2022   Lab Results  Component Value Date   HGBA1C 5.7 (H) 11/25/2021   Lab Results  Component Value Date   VITAMINB12 309 11/26/2021   No results found for: "TSH"      No data to display               No data to display           ASSESSMENT AND PLAN  89 y.o. year old male  has a past  medical history of Anemia, Colon polyps, Coronary artery disease, Hematuria, History of hiatal hernia, and Hypertension. here with    No diagnosis found.  Eusebio Me ***.  Healthy lifestyle habits encouraged. *** will follow up with PCP as directed. *** will return to see me in ***, sooner if needed. *** verbalizes understanding and agreement with this plan.   No orders of the defined types were placed in this encounter.    No orders of the defined types were placed in this encounter.    Shawnie Dapper, MSN, FNP-C 07/19/2022, 12:56 PM  [facility] [address] [place], [place] [address] [phone]

## 2022-07-19 NOTE — Patient Instructions (Signed)
Below is our plan:  We will continue to monitor. Please keep an eye on your blood pressure. I recommend keeping readings lower than 130/90.   Please make sure you are staying well hydrated. I recommend 50-60 ounces daily. Well balanced diet and regular exercise encouraged. Consistent sleep schedule with 6-8 hours recommended.   Please continue follow up with care team as directed.   Follow up with me in 1 year  You may receive a survey regarding today's visit. I encourage you to leave honest feed back as I do use this information to improve patient care. Thank you for seeing me today!

## 2022-07-21 ENCOUNTER — Ambulatory Visit: Payer: Medicare HMO | Admitting: Family Medicine

## 2022-07-21 ENCOUNTER — Encounter: Payer: Self-pay | Admitting: Family Medicine

## 2022-07-21 VITALS — BP 148/80 | HR 88 | Ht 72.0 in | Wt 180.0 lb

## 2022-07-21 DIAGNOSIS — G8194 Hemiplegia, unspecified affecting left nondominant side: Secondary | ICD-10-CM | POA: Diagnosis not present

## 2022-07-21 DIAGNOSIS — I6381 Other cerebral infarction due to occlusion or stenosis of small artery: Secondary | ICD-10-CM | POA: Diagnosis not present

## 2022-07-27 DIAGNOSIS — I10 Essential (primary) hypertension: Secondary | ICD-10-CM | POA: Diagnosis not present

## 2022-08-03 DIAGNOSIS — I10 Essential (primary) hypertension: Secondary | ICD-10-CM | POA: Diagnosis not present

## 2022-08-03 DIAGNOSIS — Z0000 Encounter for general adult medical examination without abnormal findings: Secondary | ICD-10-CM | POA: Diagnosis not present

## 2022-08-03 DIAGNOSIS — Z1331 Encounter for screening for depression: Secondary | ICD-10-CM | POA: Diagnosis not present

## 2022-08-03 DIAGNOSIS — I2510 Atherosclerotic heart disease of native coronary artery without angina pectoris: Secondary | ICD-10-CM | POA: Diagnosis not present

## 2022-08-03 DIAGNOSIS — Z0001 Encounter for general adult medical examination with abnormal findings: Secondary | ICD-10-CM | POA: Diagnosis not present

## 2022-11-10 DIAGNOSIS — L97329 Non-pressure chronic ulcer of left ankle with unspecified severity: Secondary | ICD-10-CM | POA: Diagnosis not present

## 2022-11-10 DIAGNOSIS — R600 Localized edema: Secondary | ICD-10-CM | POA: Diagnosis not present

## 2023-01-31 DIAGNOSIS — I10 Essential (primary) hypertension: Secondary | ICD-10-CM | POA: Diagnosis not present

## 2023-02-07 DIAGNOSIS — I2510 Atherosclerotic heart disease of native coronary artery without angina pectoris: Secondary | ICD-10-CM | POA: Diagnosis not present

## 2023-02-07 DIAGNOSIS — I10 Essential (primary) hypertension: Secondary | ICD-10-CM | POA: Diagnosis not present

## 2023-06-09 DIAGNOSIS — I10 Essential (primary) hypertension: Secondary | ICD-10-CM | POA: Diagnosis not present

## 2023-06-09 DIAGNOSIS — R600 Localized edema: Secondary | ICD-10-CM | POA: Diagnosis not present

## 2023-06-09 DIAGNOSIS — S81802A Unspecified open wound, left lower leg, initial encounter: Secondary | ICD-10-CM | POA: Diagnosis not present

## 2023-06-14 DIAGNOSIS — S81802A Unspecified open wound, left lower leg, initial encounter: Secondary | ICD-10-CM | POA: Diagnosis not present

## 2023-06-14 DIAGNOSIS — I10 Essential (primary) hypertension: Secondary | ICD-10-CM | POA: Diagnosis not present

## 2023-06-14 DIAGNOSIS — R600 Localized edema: Secondary | ICD-10-CM | POA: Diagnosis not present

## 2023-07-26 NOTE — Progress Notes (Signed)
 Chief Complaint  Patient presents with   Follow-up    RM 1 with daughter. Last seen 07/21/22.  Ambulating with rolling walker. No changes since last visit per pt. Having increased swelling in left leg, taking lasix . Started about 3 wk ago on this. Went to [facility] and has f/u this week for this.    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 He was last seen by me 06/2022 and doing well. Since, he reports continuing to do well. He denies new or worsening stroke symptoms. He continues to have mild left lower ext weakness. He uses his Rolator or cane at all times. No falls. He walks to his mailbox a couple times a day.   BP usually 135-145/70-80. Feels it is elevated today due to difficulty walking in. He is having significant lower extremity edema. He continues lasix . Amlodipine  was decreased to 5mg  05/2023 due to lower ext edema. He continues atorvastatin  and asa. LDL 52 06/2022. He has follow up scheduled with PCP next week.   07/21/2022 ALL:  Kevin [name] is a 89 y.o. male here today for follow up for history of right thalamic infarct 10/2021. He was last seen by Dr Janett Medin 12/2021 and doing fairly well.   He continues to do well. He is using a walker. Continues to have left lower ext weakness but feels he gets around well. No falls. He was last seen by Dr Sharl Davies 01/2022 and doing well. No follow up recommended. He continues atorvastatin  40mg  and asa 81mg  daily. Followed regularly by PCP. LDL well controlled 12/2021. BP is usually 130-140 at home. Elevated, today. He had just taken HTN meds about an hour ago. He has appt with PCP next week. He walks daily. He lives with his nephew. Daughter helps with meds and meals. He does not drive.   HISTORY (copied from Dr Wynelle Heather previous note)  HPI: Kevin [name] is a pleasant 89 year old African-American male seen today for initial office follow-up visit following hospital  admission for stroke and September 2023.  He is accompanied by his daughter.  History is obtained from them and review of electronic medical records and I have personally reviewed pertinent available imaging studies in PACS.  He has past medical history of hypertension, coronary artery disease, hiatal hernia EMEA.  He presented on 11/25/2021 with 1 day history of left-sided weakness, numbness and incoordination and gait difficulty.  He was found to have NIH stroke scale of 6.  He presented outside time window for thrombolysis.  MRI scan of the brain showed acute right thalamic infarct.  CT angiogram of the brain and neck showed no large vessel stenosis or occlusion.  2D echo showed ejection fraction of 70 to 75%.  EEG was normal.  LDL cholesterol was 98 mg percent. Hb A1c was 5.7.  Urine drug screen was negative.  MRI scan of the cervical and lumbar spine showed multifocal spondylitic changes. MRI scan of the thoracic spine showed only minor disc degenerative changes.  He was started on aspirin  and Plavix  and discharged home with home physical and occupational therapy but patient refused it.  He was previously walking with a cane but now has had to use a walker.  He can walk slowly but the still feels his left leg is heavy and weak.  He also has noticed diminished fine motor skills in the left hand.  Numbness has resolved.  He is tolerating  Lipitor well without muscle aches and pains.  He is on aspirin  minor bruising but no bleeding.  He has not had any recurrent stroke or TIA symptoms.  He lives at home with his daughter is mostly independent in most activities of daily living.    REVIEW OF SYSTEMS: Out of a complete 14 system review of symptoms, the patient complains only of the following symptoms, left lower ext weakness, intermittent right shoulder stiffness and all other reviewed systems are negative.   ALLERGIES: No Known Allergies   HOME MEDICATIONS: Outpatient Medications Prior to Visit   Medication Sig Dispense Refill   amLODipine  (NORVASC ) 10 MG tablet Take 1 tablet (10 mg total) by mouth daily. (Patient taking differently: Take 5 mg by mouth daily.) 30 tablet 0   aspirin  81 MG chewable tablet Chew 1 tablet (81 mg total) by mouth daily.     atorvastatin  (LIPITOR) 40 MG tablet Take 1 tablet (40 mg total) by mouth every evening. 30 tablet 0   furosemide  (LASIX ) 20 MG tablet Take 20 mg by mouth daily.     hydrALAZINE  (APRESOLINE ) 25 MG tablet Take 1 tablet (25 mg total) by mouth every 8 (eight) hours. 90 tablet 0   hydrochlorothiazide (HYDRODIURIL) 12.5 MG tablet Take 12.5 mg by mouth daily.     No facility-administered medications prior to visit.     PAST MEDICAL HISTORY: Past Medical History:  Diagnosis Date   Anemia    Colon polyps    Coronary artery disease    Hematuria    History of hiatal hernia    Hypertension      PAST SURGICAL HISTORY: Past Surgical History:  Procedure Laterality Date   COLONOSCOPY WITH ESOPHAGOGASTRODUODENOSCOPY (EGD)     COLONOSCOPY WITH PROPOFOL  N/A 09/27/2014   Procedure: COLONOSCOPY WITH PROPOFOL ;  Surgeon: Cassie Click, MD;  Location: [facility] ENDOSCOPY;  Service: Endoscopy;  Laterality: N/A;   JOINT REPLACEMENT       FAMILY HISTORY: No family history on file.   SOCIAL HISTORY: Social History   Socioeconomic History   Marital status: Widowed    Spouse name: Not on file   Number of children: 4   Years of education: 19   Highest education level: Not on file  Occupational History   Not on file  Tobacco Use   Smoking status: Never   Smokeless tobacco: Not on file  Vaping Use   Vaping status: Never Used  Substance and Sexual Activity   Alcohol  use: Not Currently    Alcohol /week: 5.0 standard drinks of alcohol     Types: 5 Cans of beer per week   Drug use: No   Sexual activity: Not on file  Other Topics Concern   Not on file  Social History Narrative   Right handed   Caffeine use: Tea daily   Social Drivers of  Health   Financial Resource Strain: Low Risk  (08/03/2022)   Received from [facility]   Overall Financial Resource Strain (CARDIA)    Difficulty of Paying Living Expenses: Not hard at all  Food Insecurity: No Food Insecurity (08/03/2022)   Received from [facility]   Hunger Vital Sign    Worried About Running Out of Food in the Last Year: Never true    Ran Out of Food in the Last Year: Never true  Transportation Needs: No Transportation Needs (08/03/2022)   Received from [facility] - Transportation    In the past 12 months,  has lack of transportation kept you from medical appointments or from getting medications?: No    Lack of Transportation (Non-Medical): No  Physical Activity: Not on file  Stress: Not on file  Social Connections: Not on file  Intimate Partner Violence: Not At Risk (11/26/2021)   Humiliation, Afraid, Rape, and Kick questionnaire    Fear of Current or Ex-Partner: No    Emotionally Abused: No    Physically Abused: No    Sexually Abused: No     PHYSICAL EXAM  Vitals:   07/27/23 0853  BP: (!) 162/79  Pulse: 96  Weight: 177 lb (80.3 kg)  Height: 6' (1.829 m)    Body mass index is 24.01 kg/m.  Generalized: Well developed, in no acute distress  Cardiology: normal rate and rhythm, no murmur auscultated  Respiratory: clear to auscultation bilaterally    Neurological examination  Mentation: Alert oriented to time, place, history taking. Follows all commands speech and language fluent Cranial nerve II-XII: Pupils were equal round reactive to light. Extraocular movements were full, visual field were full on confrontational test. Facial sensation and strength were normal. Head turning and shoulder shrug  were normal and symmetric. Motor: The motor testing reveals 5 over 5 strength of all 4 extremities with exception of 4/5 left hip flexion. Good symmetric motor tone is noted throughout.  Sensory:  Sensory testing is intact to soft touch on all 4 extremities. No evidence of extinction is noted.   Gait and station: able to push to standing position. Left hemiplegic gait. Significant inversion of left foot. Non supportive shoes. Stable with walker.  Skin: significant pitting edema noted of bilateral lower ext, L>R. Open wound of left lateral calf, no obvious signs of infection, multiple open skin tears with serous drainage noted on sock.    DIAGNOSTIC DATA (LABS, IMAGING, TESTING) - I reviewed patient records, labs, notes, testing and imaging myself where available.  Lab Results  Component Value Date   WBC 5.7 12/03/2021   HGB 10.9 (L) 12/03/2021   HCT 33.5 (L) 12/03/2021   MCV 84.4 12/03/2021   PLT 311 12/03/2021      Component Value Date/Time   NA 140 12/05/2021 0621   NA 140 12/23/2012 0415   K 3.9 12/05/2021 0621   K 3.6 12/23/2012 0415   CL 106 12/05/2021 0621   CL 107 12/23/2012 0415   CO2 24 12/05/2021 0621   CO2 29 12/23/2012 0415   GLUCOSE 101 (H) 12/05/2021 0621   GLUCOSE 105 (H) 12/23/2012 0415   BUN 22 12/05/2021 0621   BUN 7 12/23/2012 0415   CREATININE 0.87 12/05/2021 0621   CREATININE 1.09 12/23/2012 0415   CALCIUM  9.4 12/05/2021 0621   CALCIUM  8.4 (L) 12/23/2012 0415   PROT 6.1 (L) 12/03/2021 0536   ALBUMIN 2.9 (L) 12/03/2021 0536   AST 15 12/03/2021 0536   ALT 16 12/03/2021 0536   ALKPHOS 52 12/03/2021 0536   BILITOT 0.4 12/03/2021 0536   GFRNONAA >60 12/05/2021 0621   GFRNONAA >60 12/23/2012 0415   GFRAA >60 12/23/2012 0415   Lab Results  Component Value Date   CHOL 103 01/14/2022   HDL 49 01/14/2022   LDLCALC 42 01/14/2022   TRIG 47 01/14/2022   CHOLHDL 2.1 01/14/2022   Lab Results  Component Value Date   HGBA1C 5.7 (H) 11/25/2021   Lab Results  Component Value Date   VITAMINB12 309 11/26/2021   No results found for: "TSH"      No data to display  No data to display           ASSESSMENT AND PLAN  89  y.o. year old male  has a past medical history of Anemia, Colon polyps, Coronary artery disease, Hematuria, History of hiatal hernia, and Hypertension. here with    Thalamic stroke (HCC)  Left hemiplegia (HCC)  Bilateral lower extremity edema  Open wound of left lower leg, initial encounter  Kevin [name] is doing well, today. Left lower extremity weakness has improved. BP was elevated, today but usually well managed at home. We have reviewed BP goals. He will continue asa and atorvastatin  as prescribed by PCP. We discussed concerns of lower ext edema. Stana Ear (daughter) reports being able to apply wraps when they get home. I have advised they follow up closely with PCP if no improvement in 24-48 hours. Would care as directed per PCP. Consider referral as appropriate to vascular or PT. He will continue close follow up with care team.  Fall precautions advised. S/S stroke reviewed. Healthy lifestyle habits encouraged. He will return to see me in 1 year, sooner if needed. He verbalizes understanding and agreement with this plan.   No orders of the defined types were placed in this encounter.    No orders of the defined types were placed in this encounter.   I spent 40 minutes of face-to-face and non-face-to-face time with patient.  This included previsit chart review, lab review, study review, order entry, electronic health record documentation, patient education.   Terrilyn Fick, MSN, FNP-C 07/27/2023, 9:55 AM  [facility] [address], [address] [place], [place] [address] [phone]

## 2023-07-26 NOTE — Patient Instructions (Signed)

## 2023-07-27 ENCOUNTER — Encounter: Payer: Self-pay | Admitting: Family Medicine

## 2023-07-27 ENCOUNTER — Ambulatory Visit (INDEPENDENT_AMBULATORY_CARE_PROVIDER_SITE_OTHER): Payer: Medicare HMO | Admitting: Family Medicine

## 2023-07-27 VITALS — BP 162/79 | HR 96 | Ht 72.0 in | Wt 177.0 lb

## 2023-07-27 DIAGNOSIS — G8194 Hemiplegia, unspecified affecting left nondominant side: Secondary | ICD-10-CM

## 2023-07-27 DIAGNOSIS — I6381 Other cerebral infarction due to occlusion or stenosis of small artery: Secondary | ICD-10-CM

## 2023-07-27 DIAGNOSIS — S81802A Unspecified open wound, left lower leg, initial encounter: Secondary | ICD-10-CM | POA: Diagnosis not present

## 2023-07-27 DIAGNOSIS — R600 Localized edema: Secondary | ICD-10-CM | POA: Diagnosis not present

## 2023-07-29 DIAGNOSIS — I10 Essential (primary) hypertension: Secondary | ICD-10-CM | POA: Diagnosis not present
